# Patient Record
Sex: Female | Born: 1940
Health system: Southern US, Community
[De-identification: ages and names within clinical notes are randomized; demographics above are authoritative.]

## PROBLEM LIST (undated history)

## (undated) DIAGNOSIS — F32A Depression, unspecified: Secondary | ICD-10-CM

## (undated) DIAGNOSIS — F329 Major depressive disorder, single episode, unspecified: Secondary | ICD-10-CM

## (undated) DIAGNOSIS — Z86010 Personal history of colonic polyps: Secondary | ICD-10-CM

## (undated) DIAGNOSIS — K219 Gastro-esophageal reflux disease without esophagitis: Secondary | ICD-10-CM

## (undated) DIAGNOSIS — H409 Unspecified glaucoma: Secondary | ICD-10-CM

## (undated) DIAGNOSIS — E669 Obesity, unspecified: Secondary | ICD-10-CM

## (undated) DIAGNOSIS — I10 Essential (primary) hypertension: Secondary | ICD-10-CM

## (undated) DIAGNOSIS — Z860101 Personal history of adenomatous and serrated colon polyps: Secondary | ICD-10-CM

## (undated) DIAGNOSIS — M1990 Unspecified osteoarthritis, unspecified site: Secondary | ICD-10-CM

## (undated) DIAGNOSIS — E119 Type 2 diabetes mellitus without complications: Secondary | ICD-10-CM

## (undated) DIAGNOSIS — F0390 Unspecified dementia without behavioral disturbance: Secondary | ICD-10-CM

## (undated) DIAGNOSIS — E785 Hyperlipidemia, unspecified: Secondary | ICD-10-CM

## (undated) HISTORY — PX: ABDOMINAL HYSTERECTOMY: SHX81

## (undated) HISTORY — PX: TONSILLECTOMY AND ADENOIDECTOMY: SHX28

## (undated) HISTORY — DX: Obesity, unspecified: E66.9

## (undated) HISTORY — PX: THYROID CYST EXCISION: SHX2511

## (undated) HISTORY — DX: Essential (primary) hypertension: I10

## (undated) HISTORY — DX: Major depressive disorder, single episode, unspecified: F32.9

## (undated) HISTORY — DX: Hyperlipidemia, unspecified: E78.5

## (undated) HISTORY — PX: CHOLECYSTECTOMY: SHX55

## (undated) HISTORY — DX: Type 2 diabetes mellitus without complications: E11.9

## (undated) HISTORY — DX: Depression, unspecified: F32.A

## (undated) HISTORY — DX: Personal history of colonic polyps: Z86.010

## (undated) HISTORY — DX: Unspecified dementia, unspecified severity, without behavioral disturbance, psychotic disturbance, mood disturbance, and anxiety: F03.90

## (undated) HISTORY — DX: Unspecified osteoarthritis, unspecified site: M19.90

## (undated) HISTORY — DX: Personal history of adenomatous and serrated colon polyps: Z86.0101

## (undated) HISTORY — DX: Gastro-esophageal reflux disease without esophagitis: K21.9

## (undated) HISTORY — DX: Unspecified glaucoma: H40.9

---

## 2014-07-02 ENCOUNTER — Telehealth: Payer: Self-pay | Admitting: Family Medicine

## 2014-07-03 NOTE — Telephone Encounter (Signed)
Scheduled appt for pt Reviewed meds with pt No current controlled substances

## 2014-07-18 ENCOUNTER — Encounter (INDEPENDENT_AMBULATORY_CARE_PROVIDER_SITE_OTHER): Payer: Self-pay

## 2014-07-18 ENCOUNTER — Ambulatory Visit (INDEPENDENT_AMBULATORY_CARE_PROVIDER_SITE_OTHER): Payer: Commercial Managed Care - HMO | Admitting: Family Medicine

## 2014-07-18 ENCOUNTER — Encounter: Payer: Self-pay | Admitting: Family Medicine

## 2014-07-18 VITALS — BP 140/80 | HR 64 | Temp 97.1°F | Ht 64.0 in | Wt 190.0 lb

## 2014-07-18 DIAGNOSIS — E1169 Type 2 diabetes mellitus with other specified complication: Secondary | ICD-10-CM | POA: Insufficient documentation

## 2014-07-18 DIAGNOSIS — E119 Type 2 diabetes mellitus without complications: Secondary | ICD-10-CM | POA: Diagnosis not present

## 2014-07-18 DIAGNOSIS — F0390 Unspecified dementia without behavioral disturbance: Secondary | ICD-10-CM | POA: Insufficient documentation

## 2014-07-18 LAB — POCT GLYCOSYLATED HEMOGLOBIN (HGB A1C): Hemoglobin A1C: 6.5

## 2014-07-18 NOTE — Progress Notes (Signed)
   Subjective:    Patient ID: Linda Fletcher, female    DOB: 11-Oct-1940, 74 y.o.   MRN: [id number]  HPI 74 year old female who recently relocated from [place]. She has a history of diabetes and dementia. She was started on Aricept sometime in the past. She will be living with her son who also relocated here. She had been in what sounds like assisted living in [place]. She denies symptoms today.    Review of Systems  Constitutional: Negative.   HENT: Negative.   Respiratory: Negative.   Cardiovascular: Positive for leg swelling.  Gastrointestinal: Positive for diarrhea.  Musculoskeletal: Negative.   Neurological: Negative.   Psychiatric/Behavioral: Positive for confusion.   Patient Active Problem List   Diagnosis Date Noted  . Dementia   . Diabetes mellitus without complication    Outpatient Encounter Prescriptions as of 07/18/2014  Medication Sig  . calcium carbonate (OS-CAL) 600 MG TABS tablet Take 600 mg by mouth 2 (two) times daily with a meal.  . celecoxib (CELEBREX) 200 MG capsule   . citalopram (CELEXA) 40 MG tablet Take 40 mg by mouth daily.  Marland Kitchen donepezil (ARICEPT) 10 MG tablet Take 10 mg by mouth at bedtime.  . furosemide (LASIX) 40 MG tablet Take 40 mg by mouth daily.  Marland Kitchen glipiZIDE (GLUCOTROL) 10 MG tablet Take 10 mg by mouth 2 (two) times daily.  . lansoprazole (PREVACID) 30 MG capsule TAKE 1 CAPSULE BY MOUTH DAILY 30 MINTUES BEFORE BREAKFAST  . losartan (COZAAR) 50 MG tablet Take 50 mg by mouth daily.  Marland Kitchen lovastatin (MEVACOR) 20 MG tablet Take 20 mg by mouth daily.  . Melatonin 1 MG CAPS Take by mouth.  . metFORMIN (GLUCOPHAGE) 500 MG tablet Take 500 mg by mouth 2 (two) times daily. 1 tablet in the morning and 2 in the evening  . Probiotic Product (PROBIOTIC DAILY PO) Take by mouth.  . traMADol (ULTRAM) 50 MG tablet Take 50 mg by mouth every 6 (six) hours as needed.   No facility-administered encounter medications on file as of 07/18/2014.       Objective:   Physical  Exam  Constitutional: She is oriented to person, place, and time. She appears well-developed and well-nourished.  Neck: Normal range of motion.  Cardiovascular: Normal rate, regular rhythm, normal heart sounds and intact distal pulses.   Pulmonary/Chest: Effort normal and breath sounds normal.  Abdominal: Soft. Bowel sounds are normal. There is no tenderness.  Neurological: She is alert and oriented to person, place, and time.  To assess cognitive abilities patient was asked to draw a clock and designated time which she did successfully. She also remembered 2 of 3 words at 5 minutes, but she could not remember what she ate for supper last night          Assessment & Plan:  1. Diabetes mellitus without complication And the like Sugars at home have been less than 150 generally but she is not very compliant eating candy - POCT glycosylated hemoglobin (Hb A1C) - BMP8+EGFR  2. Dementia Discussed need or use of Aricept. Since she is having some GI issues with diarrhea suggested DC Aricept and observe for effects  Wardell Honour MD

## 2014-07-18 NOTE — Addendum Note (Signed)
Addended by: Ilean China on: 07/18/2014 11:45 AM   Modules accepted: Orders

## 2014-07-19 LAB — BMP8+EGFR
BUN/Creatinine Ratio: 21 (ref 11–26)
BUN: 14 mg/dL (ref 8–27)
CHLORIDE: 101 mmol/L (ref 97–108)
CO2: 24 mmol/L (ref 18–29)
Calcium: 9.5 mg/dL (ref 8.7–10.3)
Creatinine, Ser: 0.67 mg/dL (ref 0.57–1.00)
GFR calc non Af Amer: 86 mL/min/{1.73_m2} (ref >59)
GFR, EST AFRICAN AMERICAN: 99 mL/min/{1.73_m2} (ref >59)
GLUCOSE: 177 mg/dL — AB (ref 65–99)
POTASSIUM: 4 mmol/L (ref 3.5–5.2)
Sodium: 142 mmol/L (ref 134–144)

## 2014-07-19 MED ORDER — GLUCOSE BLOOD VI STRP: ORAL_STRIP | Status: DC

## 2014-07-19 NOTE — Addendum Note (Signed)
Addended by: Ilean China on: 07/19/2014 11:52 AM   Modules accepted: Orders

## 2014-07-20 ENCOUNTER — Telehealth: Payer: Self-pay | Admitting: *Deleted

## 2014-07-20 NOTE — Telephone Encounter (Signed)
-----   Message from Wardell Honour, MD sent at 07/19/2014  7:43 AM EDT ----- Hemoglobin A1c is at goal so no changes are recommended electrolytesshow elevated random sugar but is otherwise normal including normal renal function

## 2014-08-20 ENCOUNTER — Telehealth: Payer: Self-pay | Admitting: Family Medicine

## 2014-08-20 MED ORDER — LANSOPRAZOLE 30 MG PO CPDR: DELAYED_RELEASE_CAPSULE | ORAL | Status: DC

## 2014-08-20 MED ORDER — FUROSEMIDE 40 MG PO TABS: 40.0000 mg | ORAL_TABLET | Freq: Every day | ORAL | Status: DC

## 2014-08-20 MED ORDER — CITALOPRAM HYDROBROMIDE 40 MG PO TABS: 40.0000 mg | ORAL_TABLET | Freq: Every day | ORAL | Status: DC

## 2014-08-20 MED ORDER — LOVASTATIN 20 MG PO TABS: 20.0000 mg | ORAL_TABLET | Freq: Every day | ORAL | Status: DC

## 2014-08-20 NOTE — Telephone Encounter (Signed)
done

## 2014-08-27 ENCOUNTER — Encounter: Payer: Self-pay | Admitting: *Deleted

## 2014-09-10 ENCOUNTER — Other Ambulatory Visit: Payer: Self-pay | Admitting: Family Medicine

## 2014-09-10 MED ORDER — CELECOXIB 200 MG PO CAPS: 200.0000 mg | ORAL_CAPSULE | Freq: Every day | ORAL | Status: DC

## 2014-09-10 NOTE — Telephone Encounter (Signed)
done

## 2014-09-19 ENCOUNTER — Encounter: Payer: Commercial Managed Care - HMO | Admitting: Family Medicine

## 2014-09-20 ENCOUNTER — Encounter: Payer: Commercial Managed Care - HMO | Admitting: Family Medicine

## 2014-09-25 ENCOUNTER — Encounter: Payer: Self-pay | Admitting: Family Medicine

## 2014-09-25 ENCOUNTER — Ambulatory Visit (INDEPENDENT_AMBULATORY_CARE_PROVIDER_SITE_OTHER): Payer: Commercial Managed Care - HMO | Admitting: Family Medicine

## 2014-09-25 VITALS — BP 140/76 | HR 66 | Temp 97.5°F | Ht 64.0 in | Wt 189.0 lb

## 2014-09-25 DIAGNOSIS — E119 Type 2 diabetes mellitus without complications: Secondary | ICD-10-CM | POA: Diagnosis not present

## 2014-09-25 DIAGNOSIS — E1169 Type 2 diabetes mellitus with other specified complication: Secondary | ICD-10-CM | POA: Insufficient documentation

## 2014-09-25 DIAGNOSIS — E785 Hyperlipidemia, unspecified: Secondary | ICD-10-CM

## 2014-09-25 LAB — POCT UA - MICROALBUMIN: MICROALBUMIN (UR) POC: 20 mg/L

## 2014-09-25 LAB — POCT GLYCOSYLATED HEMOGLOBIN (HGB A1C): HEMOGLOBIN A1C: 6.3

## 2014-09-25 NOTE — Patient Instructions (Signed)
You may receive a survey either by mail or email. Please take time to complete this as it helps [place] to serve you better.  Health Maintenance Adopting a healthy lifestyle and getting preventive care can go a long way to promote health and wellness. Talk with your health care provider about what schedule of regular examinations is right for you. This is a good chance for you to check in with your provider about disease prevention and staying healthy. In between checkups, there are plenty of things you can do on your own. Experts have done a lot of research about which lifestyle changes and preventive measures are most likely to keep you healthy. Ask your health care provider for more information. WEIGHT AND DIET  Eat a healthy diet  Be sure to include plenty of vegetables, fruits, low-fat dairy products, and lean protein.  Do not eat a lot of foods high in solid fats, added sugars, or salt.  Get regular exercise. This is one of the most important things you can do for your health.  Most adults should exercise for at least 150 minutes each week. The exercise should increase your heart rate and make you sweat (moderate-intensity exercise).  Most adults should also do strengthening exercises at least twice a week. This is in addition to the moderate-intensity exercise.  Maintain a healthy weight  Body mass index (BMI) is a measurement that can be used to identify possible weight problems. It estimates body fat based on height and weight. Your health care provider can help determine your BMI and help you achieve or maintain a healthy weight.  For females 75 years of age and older:   A BMI below 18.5 is considered underweight.  A BMI of 18.5 to 24.9 is normal.  A BMI of 25 to 29.9 is considered overweight.  A BMI of 30 and above is considered obese.  Watch levels of cholesterol and blood lipids  You should start having your blood tested for lipids and cholesterol at 75 years of age, then  have this test every 5 years.  You may need to have your cholesterol levels checked more often if:  Your lipid or cholesterol levels are high.  You are older than 75 years of age.  You are at high risk for heart disease.  CANCER SCREENING   Lung Cancer  Lung cancer screening is recommended for adults 14-53 years old who are at high risk for lung cancer because of a history of smoking.  A yearly low-dose CT scan of the lungs is recommended for people who:  Currently smoke.  Have quit within the past 15 years.  Have at least a 30-pack-year history of smoking. A pack year is smoking an average of one pack of cigarettes a day for 1 year.  Yearly screening should continue until it has been 15 years since you quit.  Yearly screening should stop if you develop a health problem that would prevent you from having lung cancer treatment.  Breast Cancer  Practice breast self-awareness. This means understanding how your breasts normally appear and feel.  It also means doing regular breast self-exams. Let your health care provider know about any changes, no matter how small.  If you are in your 20s or 30s, you should have a clinical breast exam (CBE) by a health care provider every 1-3 years as part of a regular health exam.  If you are 99 or older, have a CBE every year. Also consider having a breast X-ray (mammogram)  every year.  If you have a family history of breast cancer, talk to your health care provider about genetic screening.  If you are at high risk for breast cancer, talk to your health care provider about having an MRI and a mammogram every year.  Breast cancer gene (BRCA) assessment is recommended for women who have family members with BRCA-related cancers. BRCA-related cancers include:  Breast.  Ovarian.  Tubal.  Peritoneal cancers.  Results of the assessment will determine the need for genetic counseling and BRCA1 and BRCA2 testing. Cervical Cancer Routine  pelvic examinations to screen for cervical cancer are no longer recommended for nonpregnant women who are considered low risk for cancer of the pelvic organs (ovaries, uterus, and vagina) and who do not have symptoms. A pelvic examination may be necessary if you have symptoms including those associated with pelvic infections. Ask your health care provider if a screening pelvic exam is right for you.   The Pap test is the screening test for cervical cancer for women who are considered at risk.  If you had a hysterectomy for a problem that was not cancer or a condition that could lead to cancer, then you no longer need Pap tests.  If you are older than 75 years, and you have had normal Pap tests for the past 10 years, you no longer need to have Pap tests.  If you have had past treatment for cervical cancer or a condition that could lead to cancer, you need Pap tests and screening for cancer for at least 20 years after your treatment.  If you no longer get a Pap test, assess your risk factors if they change (such as having a new sexual partner). This can affect whether you should start being screened again.  Some women have medical problems that increase their chance of getting cervical cancer. If this is the case for you, your health care provider may recommend more frequent screening and Pap tests.  The human papillomavirus (HPV) test is another test that may be used for cervical cancer screening. The HPV test looks for the virus that can cause cell changes in the cervix. The cells collected during the Pap test can be tested for HPV.  The HPV test can be used to screen women 75 years of age and older. Getting tested for HPV can extend the interval between normal Pap tests from three to five years.  An HPV test also should be used to screen women of any age who have unclear Pap test results.  After 75 years of age, women should have HPV testing as often as Pap tests.  Colorectal Cancer  This  type of cancer can be detected and often prevented.  Routine colorectal cancer screening usually begins at 75 years of age and continues through 75 years of age.  Your health care provider may recommend screening at an earlier age if you have risk factors for colon cancer.  Your health care provider may also recommend using home test kits to check for hidden blood in the stool.  A small camera at the end of a tube can be used to examine your colon directly (sigmoidoscopy or colonoscopy). This is done to check for the earliest forms of colorectal cancer.  Routine screening usually begins at age 85.  Direct examination of the colon should be repeated every 5-10 years through 75 years of age. However, you may need to be screened more often if early forms of precancerous polyps or small growths are  found. Skin Cancer  Check your skin from head to toe regularly.  Tell your health care provider about any new moles or changes in moles, especially if there is a change in a mole's shape or color.  Also tell your health care provider if you have a mole that is larger than the size of a pencil eraser.  Always use sunscreen. Apply sunscreen liberally and repeatedly throughout the day.  Protect yourself by wearing long sleeves, pants, a wide-brimmed hat, and sunglasses whenever you are outside. HEART DISEASE, DIABETES, AND HIGH BLOOD PRESSURE   Have your blood pressure checked at least every 1-2 years. High blood pressure causes heart disease and increases the risk of stroke.  If you are between 32 years and 69 years old, ask your health care provider if you should take aspirin to prevent strokes.  Have regular diabetes screenings. This involves taking a blood sample to check your fasting blood sugar level.  If you are at a normal weight and have a low risk for diabetes, have this test once every three years after 75 years of age.  If you are overweight and have a high risk for diabetes,  consider being tested at a younger age or more often. PREVENTING INFECTION  Hepatitis B  If you have a higher risk for hepatitis B, you should be screened for this virus. You are considered at high risk for hepatitis B if:  You were born in a country where hepatitis B is common. Ask your health care provider which countries are considered high risk.  Your parents were born in a high-risk country, and you have not been immunized against hepatitis B (hepatitis B vaccine).  You have HIV or AIDS.  You use needles to inject street drugs.  You live with someone who has hepatitis B.  You have had sex with someone who has hepatitis B.  You get hemodialysis treatment.  You take certain medicines for conditions, including cancer, organ transplantation, and autoimmune conditions. Hepatitis C  Blood testing is recommended for:  Everyone born from [birth year] through 1966.  Anyone with known risk factors for hepatitis C. Sexually transmitted infections (STIs)  You should be screened for sexually transmitted infections (STIs) including gonorrhea and chlamydia if:  You are sexually active and are younger than 75 years of age.  You are older than 75 years of age and your health care provider tells you that you are at risk for this type of infection.  Your sexual activity has changed since you were last screened and you are at an increased risk for chlamydia or gonorrhea. Ask your health care provider if you are at risk.  If you do not have HIV, but are at risk, it may be recommended that you take a prescription medicine daily to prevent HIV infection. This is called pre-exposure prophylaxis (PrEP). You are considered at risk if:  You are sexually active and do not regularly use condoms or know the HIV status of your partner(s).  You take drugs by injection.  You are sexually active with a partner who has HIV. Talk with your health care provider about whether you are at high risk of being  infected with HIV. If you choose to begin PrEP, you should first be tested for HIV. You should then be tested every 3 months for as long as you are taking PrEP.  PREGNANCY   If you are premenopausal and you may become pregnant, ask your health care provider about preconception counseling.  If  you may become pregnant, take 400 to 800 micrograms (mcg) of folic acid every day.  If you want to prevent pregnancy, talk to your health care provider about birth control (contraception). OSTEOPOROSIS AND MENOPAUSE   Osteoporosis is a disease in which the bones lose minerals and strength with aging. This can result in serious bone fractures. Your risk for osteoporosis can be identified using a bone density scan.  If you are 100 years of age or older, or if you are at risk for osteoporosis and fractures, ask your health care provider if you should be screened.  Ask your health care provider whether you should take a calcium or vitamin D supplement to lower your risk for osteoporosis.  Menopause may have certain physical symptoms and risks.  Hormone replacement therapy may reduce some of these symptoms and risks. Talk to your health care provider about whether hormone replacement therapy is right for you.  HOME CARE INSTRUCTIONS   Schedule regular health, dental, and eye exams.  Stay current with your immunizations.   Do not use any tobacco products including cigarettes, chewing tobacco, or electronic cigarettes.  If you are pregnant, do not drink alcohol.  If you are breastfeeding, limit how much and how often you drink alcohol.  Limit alcohol intake to no more than 1 drink per day for nonpregnant women. One drink equals 12 ounces of beer, 5 ounces of wine, or 1 ounces of hard liquor.  Do not use street drugs.  Do not share needles.  Ask your health care provider for help if you need support or information about quitting drugs.  Tell your health care provider if you often feel  depressed.  Tell your health care provider if you have ever been abused or do not feel safe at home. Document Released: 07/14/2010 Document Revised: 05/15/2013 Document Reviewed: 11/30/2012 [facility] Patient Information 2015 [place], [place]. This information is not intended to replace advice given to you by your health care provider. Make sure you discuss any questions you have with your health care provider.

## 2014-09-25 NOTE — Progress Notes (Signed)
   Subjective:    Patient ID: Linda Fletcher, female    DOB: January 16, 1940, 74 y.o.   MRN: [id number]  HPI  74 year old female who is here to follow-up her diabetes and dementia. She also has hyperlipidemia and takes statin therapy. Her complaints today are arthritis and diarrhea. The diarrhea seems to have started at about the same time she was started on Aricept before she relocated here. She also takes metformin which can cause diarrhea but she has been on that for years without side effects. Regarding her dementia and memory. Per her son, it has not changed in the past 3 months. She does tend to repeat herself but otherwise does well.          Patient Active Problem List   Diagnosis Date Noted  . Dementia   . Diabetes mellitus without complication    Outpatient Encounter Prescriptions as of 09/25/2014  Medication Sig  . calcium carbonate (OS-CAL) 600 MG TABS tablet Take 600 mg by mouth 2 (two) times daily with a meal.  . celecoxib (CELEBREX) 200 MG capsule Take 1 capsule (200 mg total) by mouth daily.  . citalopram (CELEXA) 40 MG tablet Take 1 tablet (40 mg total) by mouth daily.  Marland Kitchen donepezil (ARICEPT) 10 MG tablet Take 10 mg by mouth at bedtime.  . furosemide (LASIX) 40 MG tablet Take 1 tablet (40 mg total) by mouth daily.  Marland Kitchen glipiZIDE (GLUCOTROL) 10 MG tablet Take 10 mg by mouth 2 (two) times daily.  Marland Kitchen glucose blood (FREESTYLE LITE) test strip Use 2-3 times daily. Dx: E11.9  . lansoprazole (PREVACID) 30 MG capsule TAKE 1 CAPSULE BY MOUTH DAILY 30 MINTUES BEFORE BREAKFAST  . losartan (COZAAR) 50 MG tablet Take 50 mg by mouth daily.  Marland Kitchen lovastatin (MEVACOR) 20 MG tablet Take 1 tablet (20 mg total) by mouth daily.  . Melatonin 1 MG CAPS Take by mouth.  . metFORMIN (GLUCOPHAGE) 500 MG tablet Take 500 mg by mouth 2 (two) times daily. 1 tablet in the morning and 2 in the evening  . Probiotic Product (PROBIOTIC DAILY PO) Take by mouth.  . traMADol (ULTRAM) 50 MG tablet Take 50 mg by mouth  every 6 (six) hours as needed.   No facility-administered encounter medications on file as of 09/25/2014.      Review of Systems  Constitutional: Negative.   HENT: Negative.   Eyes: Negative.   Respiratory: Negative.   Cardiovascular: Negative.   Musculoskeletal: Positive for arthralgias.  Neurological: Negative.   Psychiatric/Behavioral: Positive for confusion.       Objective:   Physical Exam  Psychiatric: She has a normal mood and affect. Her behavior is normal.    BP 140/76 mmHg  Pulse 66  Temp(Src) 97.5 F (36.4 C) (Oral)  Ht $R'5\' 4"'sY$  (1.626 m)  Wt 189 lb (85.73 kg)  BMI 32.43 kg/m2'      Assessment & Plan:  1. Diabetes mellitus without complication Previous J1O was 6.5. She does check her sugar some at home and they have generally been okay anticipate no change today - POCT UA - Microalbumin - CMP14+EGFR  2. Hyperlipidemia We have not checked a slide lipids since she moved here. I think would be appropriate to see where we are with statins and usage today  Wardell Honour MD - Lipid panel

## 2014-09-25 NOTE — Addendum Note (Signed)
Addended by: Earlene Plater on: 09/25/2014 09:57 AM   Modules accepted: Orders, SmartSet

## 2014-09-26 ENCOUNTER — Telehealth: Payer: Self-pay | Admitting: Family Medicine

## 2014-09-26 LAB — LIPID PANEL
CHOLESTEROL TOTAL: 153 mg/dL (ref 100–199)
Chol/HDL Ratio: 3.6 ratio units (ref 0.0–4.4)
HDL: 42 mg/dL (ref >39)
LDL CALC: 73 mg/dL (ref 0–99)
TRIGLYCERIDES: 191 mg/dL — AB (ref 0–149)
VLDL CHOLESTEROL CAL: 38 mg/dL (ref 5–40)

## 2014-09-26 LAB — CMP14+EGFR
A/G RATIO: 1.7 (ref 1.1–2.5)
ALK PHOS: 85 IU/L (ref 39–117)
ALT: 19 IU/L (ref 0–32)
AST: 20 IU/L (ref 0–40)
Albumin: 4.1 g/dL (ref 3.5–4.8)
BUN/Creatinine Ratio: 16 (ref 11–26)
BUN: 12 mg/dL (ref 8–27)
Bilirubin Total: 0.3 mg/dL (ref 0.0–1.2)
CO2: 26 mmol/L (ref 18–29)
Calcium: 9.1 mg/dL (ref 8.7–10.3)
Chloride: 99 mmol/L (ref 97–108)
Creatinine, Ser: 0.73 mg/dL (ref 0.57–1.00)
GFR calc Af Amer: 93 mL/min/{1.73_m2} (ref >59)
GFR calc non Af Amer: 81 mL/min/{1.73_m2} (ref >59)
GLOBULIN, TOTAL: 2.4 g/dL (ref 1.5–4.5)
Glucose: 148 mg/dL — ABNORMAL HIGH (ref 65–99)
POTASSIUM: 4.3 mmol/L (ref 3.5–5.2)
SODIUM: 140 mmol/L (ref 134–144)
Total Protein: 6.5 g/dL (ref 6.0–8.5)

## 2014-09-26 LAB — MICROALBUMIN, URINE: MICROALBUM., U, RANDOM: 34.3 ug/mL

## 2014-09-26 MED ORDER — METFORMIN HCL 500 MG PO TABS: ORAL_TABLET | ORAL | Status: DC

## 2014-09-26 MED ORDER — GLIPIZIDE 10 MG PO TABS: 10.0000 mg | ORAL_TABLET | Freq: Two times a day (BID) | ORAL | Status: DC

## 2014-09-26 NOTE — Telephone Encounter (Signed)
This pt was just here yesterday.

## 2014-09-26 NOTE — Telephone Encounter (Signed)
Verified that patient is taking metformin 1 in the morning and 2 in the evening. Script for metformin and glipizide sent to mail order as requested.  Dr Sabra Heck took patient off of Celebrex at visit yesterday and switched to Tramadol 50mg  TID PRN. Verbal order given but script was not ordered and printed.   Called to [facility] since Dr Sabra Heck is not here to sign hard script today.

## 2014-09-26 NOTE — Telephone Encounter (Signed)
Called son to verify directions for Metformin. I believe she is taking 1 tablet in the morning and 2 in the evening but there are two sets of directions on the prescription. Also wanted to see what she is taking Tramadol for. We haven't prescribed this that I can tell.

## 2014-09-27 NOTE — Progress Notes (Signed)
PATIENT AWARE

## 2014-10-04 ENCOUNTER — Other Ambulatory Visit: Payer: Self-pay | Admitting: Family Medicine

## 2014-10-04 ENCOUNTER — Other Ambulatory Visit: Payer: Self-pay | Admitting: *Deleted

## 2014-10-04 DIAGNOSIS — E119 Type 2 diabetes mellitus without complications: Secondary | ICD-10-CM

## 2014-10-04 MED ORDER — GLUCOSE BLOOD VI STRP: ORAL_STRIP | Status: DC

## 2014-10-04 NOTE — Telephone Encounter (Signed)
Pt did not have any particular blood glucose meter in mind. Ordered Accucheck & strips to [facility]

## 2014-10-10 ENCOUNTER — Encounter: Payer: Self-pay | Admitting: Family Medicine

## 2014-10-10 ENCOUNTER — Ambulatory Visit (INDEPENDENT_AMBULATORY_CARE_PROVIDER_SITE_OTHER): Payer: Commercial Managed Care - HMO | Admitting: Family Medicine

## 2014-10-10 VITALS — BP 114/79 | HR 72 | Temp 98.1°F | Ht 64.0 in | Wt 181.0 lb

## 2014-10-10 DIAGNOSIS — Z135 Encounter for screening for eye and ear disorders: Secondary | ICD-10-CM

## 2014-10-10 DIAGNOSIS — H409 Unspecified glaucoma: Secondary | ICD-10-CM

## 2014-10-10 DIAGNOSIS — F0390 Unspecified dementia without behavioral disturbance: Secondary | ICD-10-CM

## 2014-10-10 NOTE — Progress Notes (Signed)
Subjective:    Patient ID: Linda Fletcher, female    DOB: 1940/12/03, 74 y.o.   MRN: [id number]  HPI Pt here today for follow up on medications. She is accompanied today by her son. We had tried to discontinue Aricept but son thinks he saw a difference in her confusion. I explained that I could be lots of factors such as change in routine pain constipation urinary tract infection etc. but he took it upon himself to restart the Aricept. I have no great problem with that but still think it's not very efficacious. We spent some time talking about progression of dementia symptoms of withdrawal not eating lack of self-care etc. she is starting to show some signs of withdrawal morning to stay in bed a lot of the day. She has lost 8 pounds she was here about 3 weeks ago.       Patient Active Problem List   Diagnosis Date Noted  . Hyperlipidemia 09/25/2014  . Dementia   . Diabetes mellitus without complication    Outpatient Encounter Prescriptions as of 10/10/2014  Medication Sig  . calcium carbonate (OS-CAL) 600 MG TABS tablet Take 600 mg by mouth 2 (two) times daily with a meal.  . citalopram (CELEXA) 40 MG tablet Take 1 tablet (40 mg total) by mouth daily.  Marland Kitchen donepezil (ARICEPT) 10 MG tablet Take 10 mg by mouth at bedtime.  . furosemide (LASIX) 40 MG tablet Take 1 tablet (40 mg total) by mouth daily.  Marland Kitchen glipiZIDE (GLUCOTROL) 10 MG tablet Take 1 tablet (10 mg total) by mouth 2 (two) times daily.  Marland Kitchen glucose blood (FREESTYLE LITE) test strip Use 2-3 times daily. Dx: E11.9  . glucose blood test strip Use 2-3 times daily. E11.9  . lansoprazole (PREVACID) 30 MG capsule TAKE 1 CAPSULE BY MOUTH DAILY 30 MINTUES BEFORE BREAKFAST  . losartan (COZAAR) 50 MG tablet Take 50 mg by mouth daily.  Marland Kitchen lovastatin (MEVACOR) 20 MG tablet Take 1 tablet (20 mg total) by mouth daily.  . Melatonin 1 MG CAPS Take by mouth.  . metFORMIN (GLUCOPHAGE) 500 MG tablet 1 tablet in the morning and 2 in the evening  .  Probiotic Product (PROBIOTIC DAILY PO) Take by mouth.  . traMADol (ULTRAM) 50 MG tablet Take 50 mg by mouth every 6 (six) hours as needed.   No facility-administered encounter medications on file as of 10/10/2014.     Review of Systems  Constitutional: Negative.        Sleeping and laying around alot  HENT: Negative.   Eyes: Negative.   Respiratory: Negative.   Cardiovascular: Negative.   Gastrointestinal: Negative.   Endocrine: Negative.   Genitourinary: Negative.   Musculoskeletal: Negative.   Skin: Negative.   Allergic/Immunologic: Negative.   Neurological: Negative.   Hematological: Negative.   Psychiatric/Behavioral: Positive for confusion.       "moody"       Objective:   Physical Exam  Constitutional: She is oriented to person, place, and time. She appears well-developed and well-nourished.  Cardiovascular: Normal rate and regular rhythm.   Pulmonary/Chest: Effort normal and breath sounds normal.  Neurological: She is alert and oriented to person, place, and time.  Psychiatric: She has a normal mood and affect. Her behavior is normal.      BP 114/79 mmHg  Pulse 72  Temp(Src) 98.1 F (36.7 C) (Oral)  Ht 5\' 4"  (1.626 m)  Wt 181 lb (82.101 kg)  BMI 31.05 kg/m2      Assessment &  Plan:  1. Glaucoma Referral has noted below - Ambulatory referral to Ophthalmology    3. Dementia, without behavioral disturbance Is now back on Aricept. I think son has a little better understanding of natural history and progression of dementia is. I think we agreed in principal that less is better in terms of procedures and care that can be done when folks have dementia and that we do not alter prolong her life if quality is lacking  Wardell Honour MD

## 2014-10-16 ENCOUNTER — Telehealth: Payer: Self-pay | Admitting: Family Medicine

## 2014-10-19 MED ORDER — ACCU-CHEK AVIVA PLUS W/DEVICE KIT: PACK | Status: DC

## 2014-10-19 MED ORDER — ACCU-CHEK SOFT TOUCH LANCETS MISC: Status: DC

## 2014-10-19 MED ORDER — GLUCOSE BLOOD VI STRP: ORAL_STRIP | Status: DC

## 2014-10-19 NOTE — Telephone Encounter (Signed)
All rx's sent to [facility], pt aware

## 2014-10-25 DIAGNOSIS — H5203 Hypermetropia, bilateral: Secondary | ICD-10-CM | POA: Diagnosis not present

## 2014-10-25 DIAGNOSIS — E109 Type 1 diabetes mellitus without complications: Secondary | ICD-10-CM | POA: Diagnosis not present

## 2014-10-25 DIAGNOSIS — H25813 Combined forms of age-related cataract, bilateral: Secondary | ICD-10-CM | POA: Diagnosis not present

## 2014-10-25 DIAGNOSIS — E113293 Type 2 diabetes mellitus with mild nonproliferative diabetic retinopathy without macular edema, bilateral: Secondary | ICD-10-CM | POA: Diagnosis not present

## 2014-10-25 DIAGNOSIS — Z0100 Encounter for examination of eyes and vision without abnormal findings: Secondary | ICD-10-CM | POA: Diagnosis not present

## 2014-10-25 DIAGNOSIS — H40009 Preglaucoma, unspecified, unspecified eye: Secondary | ICD-10-CM | POA: Diagnosis not present

## 2014-10-25 DIAGNOSIS — H2510 Age-related nuclear cataract, unspecified eye: Secondary | ICD-10-CM | POA: Diagnosis not present

## 2014-10-25 DIAGNOSIS — H40013 Open angle with borderline findings, low risk, bilateral: Secondary | ICD-10-CM | POA: Diagnosis not present

## 2014-10-25 LAB — HM DIABETES EYE EXAM

## 2014-10-29 ENCOUNTER — Other Ambulatory Visit: Payer: Self-pay | Admitting: Family Medicine

## 2014-10-30 NOTE — Telephone Encounter (Signed)
Pt aware written Rx is at front desk ready for pickup  

## 2014-10-30 NOTE — Telephone Encounter (Signed)
Last filled 09/26/14, last seen 10/10/14. Rx will print

## 2014-10-31 ENCOUNTER — Other Ambulatory Visit: Payer: Self-pay | Admitting: *Deleted

## 2014-10-31 MED ORDER — LOSARTAN POTASSIUM 50 MG PO TABS: 50.0000 mg | ORAL_TABLET | Freq: Every day | ORAL | Status: DC

## 2014-11-15 DIAGNOSIS — H269 Unspecified cataract: Secondary | ICD-10-CM | POA: Diagnosis not present

## 2014-11-15 DIAGNOSIS — F329 Major depressive disorder, single episode, unspecified: Secondary | ICD-10-CM | POA: Diagnosis not present

## 2014-11-15 DIAGNOSIS — H409 Unspecified glaucoma: Secondary | ICD-10-CM | POA: Diagnosis not present

## 2014-11-15 DIAGNOSIS — E785 Hyperlipidemia, unspecified: Secondary | ICD-10-CM | POA: Diagnosis not present

## 2014-11-15 DIAGNOSIS — M130 Polyarthritis, unspecified: Secondary | ICD-10-CM | POA: Diagnosis not present

## 2014-11-15 DIAGNOSIS — I10 Essential (primary) hypertension: Secondary | ICD-10-CM | POA: Diagnosis not present

## 2014-11-15 DIAGNOSIS — E119 Type 2 diabetes mellitus without complications: Secondary | ICD-10-CM | POA: Diagnosis not present

## 2014-11-15 DIAGNOSIS — K219 Gastro-esophageal reflux disease without esophagitis: Secondary | ICD-10-CM | POA: Diagnosis not present

## 2014-11-15 DIAGNOSIS — F0390 Unspecified dementia without behavioral disturbance: Secondary | ICD-10-CM | POA: Diagnosis not present

## 2014-11-21 ENCOUNTER — Other Ambulatory Visit: Payer: Self-pay | Admitting: *Deleted

## 2014-11-21 MED ORDER — DONEPEZIL HCL 10 MG PO TABS: 10.0000 mg | ORAL_TABLET | Freq: Every day | ORAL | Status: DC

## 2014-11-23 ENCOUNTER — Other Ambulatory Visit: Payer: Self-pay | Admitting: *Deleted

## 2014-11-23 MED ORDER — DONEPEZIL HCL 10 MG PO TABS: 10.0000 mg | ORAL_TABLET | Freq: Every day | ORAL | Status: DC

## 2014-11-30 ENCOUNTER — Other Ambulatory Visit: Payer: Self-pay | Admitting: Family Medicine

## 2014-12-04 ENCOUNTER — Telehealth: Payer: Self-pay | Admitting: Family Medicine

## 2014-12-07 ENCOUNTER — Other Ambulatory Visit: Payer: Self-pay | Admitting: *Deleted

## 2014-12-07 MED ORDER — TRAMADOL HCL 50 MG PO TABS: 50.0000 mg | ORAL_TABLET | Freq: Three times a day (TID) | ORAL | Status: DC | PRN

## 2015-01-01 ENCOUNTER — Other Ambulatory Visit: Payer: Self-pay | Admitting: Family Medicine

## 2015-01-02 DIAGNOSIS — G309 Alzheimer's disease, unspecified: Secondary | ICD-10-CM | POA: Diagnosis not present

## 2015-01-02 DIAGNOSIS — Z0000 Encounter for general adult medical examination without abnormal findings: Secondary | ICD-10-CM | POA: Diagnosis not present

## 2015-01-02 DIAGNOSIS — R9431 Abnormal electrocardiogram [ECG] [EKG]: Secondary | ICD-10-CM | POA: Diagnosis not present

## 2015-01-02 DIAGNOSIS — E1169 Type 2 diabetes mellitus with other specified complication: Secondary | ICD-10-CM | POA: Diagnosis not present

## 2015-01-02 DIAGNOSIS — F3341 Major depressive disorder, recurrent, in partial remission: Secondary | ICD-10-CM | POA: Diagnosis not present

## 2015-01-02 DIAGNOSIS — K219 Gastro-esophageal reflux disease without esophagitis: Secondary | ICD-10-CM | POA: Diagnosis not present

## 2015-01-02 DIAGNOSIS — M1991 Primary osteoarthritis, unspecified site: Secondary | ICD-10-CM | POA: Diagnosis not present

## 2015-01-02 DIAGNOSIS — E782 Mixed hyperlipidemia: Secondary | ICD-10-CM | POA: Diagnosis not present

## 2015-01-09 ENCOUNTER — Ambulatory Visit (INDEPENDENT_AMBULATORY_CARE_PROVIDER_SITE_OTHER): Payer: Commercial Managed Care - HMO | Admitting: Family Medicine

## 2015-01-09 ENCOUNTER — Encounter: Payer: Self-pay | Admitting: Family Medicine

## 2015-01-09 VITALS — BP 133/74 | HR 74 | Temp 96.8°F | Ht 64.0 in | Wt 180.0 lb

## 2015-01-09 DIAGNOSIS — F0390 Unspecified dementia without behavioral disturbance: Secondary | ICD-10-CM

## 2015-01-09 DIAGNOSIS — Z23 Encounter for immunization: Secondary | ICD-10-CM | POA: Diagnosis not present

## 2015-01-09 DIAGNOSIS — E119 Type 2 diabetes mellitus without complications: Secondary | ICD-10-CM | POA: Diagnosis not present

## 2015-01-09 LAB — POCT GLYCOSYLATED HEMOGLOBIN (HGB A1C): HEMOGLOBIN A1C: 6

## 2015-01-09 MED ORDER — TRAMADOL HCL 50 MG PO TABS: 50.0000 mg | ORAL_TABLET | Freq: Three times a day (TID) | ORAL | Status: DC | PRN

## 2015-01-09 NOTE — Progress Notes (Signed)
   Subjective:    Patient ID: Linda Fletcher, female    DOB: Jan 19, 1940, 74 y.o.   MRN: [id number]  HPI 74 year old female who is here to follow-up her diabetes and hypertension and dementia. She is accompanied again by her son who says there have not been any changes to past 3 months regarding her dementia. She still has general lack of interest in getting out of bed and spends once or time in bed watching TV. Her weight is the same as 3 months ago. There have been no falls and no swallowing difficulties.  Patient Active Problem List   Diagnosis Date Noted  . Hyperlipidemia 09/25/2014  . Dementia   . Diabetes mellitus without complication [facility])    Outpatient Encounter Prescriptions as of 01/09/2015  Medication Sig  . Blood Glucose Monitoring Suppl (ACCU-CHEK AVIVA PLUS) W/DEVICE KIT Use to check sugar bid. DX E11.9  . calcium carbonate (OS-CAL) 600 MG TABS tablet Take 600 mg by mouth 2 (two) times daily with a meal.  . citalopram (CELEXA) 40 MG tablet TAKE 1 TABLET (40 MG TOTAL) BY MOUTH DAILY.  Marland Kitchen donepezil (ARICEPT) 10 MG tablet Take 1 tablet (10 mg total) by mouth at bedtime.  . furosemide (LASIX) 40 MG tablet Take 1 tablet (40 mg total) by mouth daily.  Marland Kitchen glipiZIDE (GLUCOTROL) 10 MG tablet Take 1 tablet (10 mg total) by mouth 2 (two) times daily.  Marland Kitchen glucose blood (ACCU-CHEK AVIVA PLUS) test strip Check blood sugar bid. DX E11.9  . Lancets (ACCU-CHEK SOFT TOUCH) lancets Test sugar bid. DX E11.9  . lansoprazole (PREVACID) 30 MG capsule TAKE 1 CAPSULE BY MOUTH DAILY 30 MINTUES BEFORE BREAKFAST  . losartan (COZAAR) 50 MG tablet Take 1 tablet (50 mg total) by mouth daily.  Marland Kitchen lovastatin (MEVACOR) 20 MG tablet TAKE 1 TABLET EVERY DAY  . Melatonin 1 MG CAPS Take by mouth.  . metFORMIN (GLUCOPHAGE) 500 MG tablet 1 tablet in the morning and 2 in the evening  . Probiotic Product (PROBIOTIC DAILY PO) Take by mouth.  . traMADol (ULTRAM) 50 MG tablet Take 1 tablet (50 mg total) by mouth 3 (three)  times daily as needed.  . [DISCONTINUED] traMADol (ULTRAM) 50 MG tablet Take 1 tablet (50 mg total) by mouth 3 (three) times daily as needed.   No facility-administered encounter medications on file as of 01/09/2015.      Review of Systems  Constitutional: Positive for activity change.  HENT: Negative.   Respiratory: Negative.   Cardiovascular: Negative.   Neurological: Negative.   Psychiatric/Behavioral: Negative.        Objective:   Physical Exam  Constitutional: She appears well-developed and well-nourished.  HENT:  Head: Normocephalic.  Cardiovascular: Normal rate, regular rhythm and normal heart sounds.   Pulmonary/Chest: Effort normal and breath sounds normal.  Neurological: She is alert. She displays normal reflexes. Coordination normal.  Psychiatric: She has a normal mood and affect. Her behavior is normal.          Assessment & Plan:  1. Diabetes mellitus without complication (HCC) D4Y today is 6.0. Current treatment regimen is metformin twice a day. Diet is satisfactory - POCT glycosylated hemoglobin (Hb A1C)  2. Dementia, without behavioral disturbance No significant change from last visit but the general apathy and lack of interest is surely an early sign of dementia  Wardell Honour MD

## 2015-02-05 ENCOUNTER — Other Ambulatory Visit: Payer: Self-pay | Admitting: Family Medicine

## 2015-02-15 ENCOUNTER — Ambulatory Visit (INDEPENDENT_AMBULATORY_CARE_PROVIDER_SITE_OTHER): Payer: Commercial Managed Care - HMO | Admitting: Family Medicine

## 2015-02-15 ENCOUNTER — Encounter: Payer: Self-pay | Admitting: Family Medicine

## 2015-02-15 VITALS — BP 133/84 | HR 91 | Temp 97.0°F | Ht 64.0 in | Wt 175.8 lb

## 2015-02-15 DIAGNOSIS — F0390 Unspecified dementia without behavioral disturbance: Secondary | ICD-10-CM | POA: Diagnosis not present

## 2015-02-15 MED ORDER — TRAMADOL HCL 50 MG PO TABS: 50.0000 mg | ORAL_TABLET | Freq: Three times a day (TID) | ORAL | Status: DC | PRN

## 2015-02-15 NOTE — Progress Notes (Signed)
   Subjective:    Patient ID: Linda Fletcher, female    DOB: Aug 19, 1940, 75 y.o.   MRN: [id number]  HPI 75 year old female with dementia. This was an unscheduled visit. She is showing more apathy wanting to stay in bed most of the day. She has had more confusion thinking that she was somebody that she is not. Appetite is still good. There have been no falls. There may be some early dysphagia. It seems that her arthritis pain is worse. Currently she uses tramadol. We discussed use of hydrocodone and possible side effect of constipation and she is adamantly against taking it. She are he has diverticulitis and that would probably aggravate that same problem.  Patient Active Problem List   Diagnosis Date Noted  . Hyperlipidemia 09/25/2014  . Dementia   . Diabetes mellitus without complication [facility])    Outpatient Encounter Prescriptions as of 02/15/2015  Medication Sig  . Blood Glucose Monitoring Suppl (ACCU-CHEK AVIVA PLUS) W/DEVICE KIT Use to check sugar bid. DX E11.9  . calcium carbonate (OS-CAL) 600 MG TABS tablet Take 600 mg by mouth 2 (two) times daily with a meal.  . citalopram (CELEXA) 40 MG tablet TAKE 1 TABLET (40 MG TOTAL) BY MOUTH DAILY.  Marland Kitchen donepezil (ARICEPT) 10 MG tablet Take 1 tablet (10 mg total) by mouth at bedtime.  . furosemide (LASIX) 40 MG tablet Take 1 tablet (40 mg total) by mouth daily.  Marland Kitchen glipiZIDE (GLUCOTROL) 10 MG tablet TAKE 1 TABLET TWICE DAILY  . glucose blood (ACCU-CHEK AVIVA PLUS) test strip Check blood sugar bid. DX E11.9  . Lancets (ACCU-CHEK SOFT TOUCH) lancets Test sugar bid. DX E11.9  . lansoprazole (PREVACID) 30 MG capsule TAKE 1 CAPSULE BY MOUTH DAILY 30 MINTUES BEFORE BREAKFAST  . losartan (COZAAR) 50 MG tablet Take 1 tablet (50 mg total) by mouth daily.  Marland Kitchen lovastatin (MEVACOR) 20 MG tablet TAKE 1 TABLET EVERY DAY  . Melatonin 1 MG CAPS Take by mouth.  . metFORMIN (GLUCOPHAGE) 500 MG tablet TAKE 1 TABLET IN THE MORNING AND TAKE 2 TABLETS IN THE EVENING  .  Probiotic Product (PROBIOTIC DAILY PO) Take by mouth.  . traMADol (ULTRAM) 50 MG tablet Take 1 tablet (50 mg total) by mouth 3 (three) times daily as needed.   No facility-administered encounter medications on file as of 02/15/2015.      Review of Systems  Constitutional: Positive for activity change.  HENT: Positive for ear discharge.   Respiratory: Negative.   Cardiovascular: Negative.   Genitourinary: Negative.   Neurological: Negative.   Psychiatric/Behavioral: Positive for hallucinations and confusion.       Objective:   Physical Exam  Constitutional: She is oriented to person, place, and time. She appears well-developed and well-nourished.  Cardiovascular: Normal rate and regular rhythm.   Pulmonary/Chest: Effort normal and breath sounds normal.  Neurological: She is alert and oriented to person, place, and time.  Psychiatric: She has a normal mood and affect. Her behavior is normal.          Assessment & Plan:  1. Dementia, without behavioral disturbance Continue  use with Aricept and the son takes he can tell a difference when she takes it or doesn't take it. I am surprised that this. We'll continue with tramadol for arthritis pain in the morning and 2 at night. Encourage activity and getting out of bed but according to son that is problematic  Wardell Honour MD

## 2015-02-19 ENCOUNTER — Telehealth: Payer: Self-pay | Admitting: Family Medicine

## 2015-02-20 NOTE — Telephone Encounter (Signed)
Please address

## 2015-02-27 ENCOUNTER — Other Ambulatory Visit: Payer: Self-pay | Admitting: Family Medicine

## 2015-03-01 DIAGNOSIS — H40013 Open angle with borderline findings, low risk, bilateral: Secondary | ICD-10-CM | POA: Diagnosis not present

## 2015-03-01 DIAGNOSIS — H40033 Anatomical narrow angle, bilateral: Secondary | ICD-10-CM | POA: Diagnosis not present

## 2015-03-04 ENCOUNTER — Other Ambulatory Visit: Payer: Self-pay | Admitting: *Deleted

## 2015-03-04 DIAGNOSIS — Z1211 Encounter for screening for malignant neoplasm of colon: Secondary | ICD-10-CM

## 2015-03-05 ENCOUNTER — Encounter (INDEPENDENT_AMBULATORY_CARE_PROVIDER_SITE_OTHER): Payer: Self-pay | Admitting: *Deleted

## 2015-03-08 ENCOUNTER — Other Ambulatory Visit: Payer: Self-pay

## 2015-03-08 DIAGNOSIS — K219 Gastro-esophageal reflux disease without esophagitis: Secondary | ICD-10-CM

## 2015-03-13 ENCOUNTER — Encounter (INDEPENDENT_AMBULATORY_CARE_PROVIDER_SITE_OTHER): Payer: Self-pay | Admitting: Internal Medicine

## 2015-03-18 ENCOUNTER — Other Ambulatory Visit: Payer: Self-pay | Admitting: Family Medicine

## 2015-03-18 NOTE — Telephone Encounter (Signed)
Last seen and filled 02/15/15. Rx will print

## 2015-03-19 DIAGNOSIS — B351 Tinea unguium: Secondary | ICD-10-CM | POA: Diagnosis not present

## 2015-03-19 DIAGNOSIS — L84 Corns and callosities: Secondary | ICD-10-CM | POA: Diagnosis not present

## 2015-03-19 DIAGNOSIS — E119 Type 2 diabetes mellitus without complications: Secondary | ICD-10-CM | POA: Diagnosis not present

## 2015-03-19 MED ORDER — TRAMADOL HCL 50 MG PO TABS: 50.0000 mg | ORAL_TABLET | Freq: Three times a day (TID) | ORAL | Status: DC | PRN

## 2015-03-19 NOTE — Telephone Encounter (Signed)
Pt aware written Rx is at front desk ready for pickup  

## 2015-04-10 ENCOUNTER — Ambulatory Visit (INDEPENDENT_AMBULATORY_CARE_PROVIDER_SITE_OTHER): Payer: Commercial Managed Care - HMO | Admitting: Internal Medicine

## 2015-04-10 ENCOUNTER — Encounter (INDEPENDENT_AMBULATORY_CARE_PROVIDER_SITE_OTHER): Payer: Self-pay | Admitting: Internal Medicine

## 2015-04-10 VITALS — BP 104/58 | HR 60 | Temp 98.5°F | Ht 64.0 in | Wt 172.9 lb

## 2015-04-10 DIAGNOSIS — K219 Gastro-esophageal reflux disease without esophagitis: Secondary | ICD-10-CM | POA: Diagnosis not present

## 2015-04-10 MED ORDER — OMEPRAZOLE 40 MG PO CPDR: 40.0000 mg | DELAYED_RELEASE_CAPSULE | Freq: Every day | ORAL | Status: DC

## 2015-04-10 NOTE — Progress Notes (Signed)
Subjective:    Patient ID: Linda Fletcher, female    DOB: 01-Jan-1941, 75 y.o.   MRN: [id number]  HPI Presents today stating she wants a scope down her esophagus. She is referred by Dr. Teodoro Spray.  Her last EGD was one year ago and she tells me she had " cancer polps". She tells me she is doing okay.  She says she has acid reflux every day. Before the close of our conversation, I ask patient if her acid reflux is controlled with the Prevacid and she says yes. She states she does not have any acid reflux. (Patient has hx of dementia). Appetite is good.  She is not having any abdominal  pain and never has She usually has a BM 3-4 times a day. Stools are usually formed.    I No procedure notes in computer). He last colonoscopy it appears was February 04/2014 . (No procedure notes in computer) Biopsy: Benign colonic mucosa with surface hyperplastic features. No acute or chronic colitis seen.  02/01/2014 Biopsy: Body of stomach,proximal: Mild chronic gastritis, No h. Pylori organisms seen, intestinal metaplasia, dysplasia or malignancy seen.  10/12/2013 EGD: pre-op diagnosis: GERD, possible early Barrett's. Post Op  3 polyps removed 8-10 mm.  No hiatal hernia.  Proximal body: diffuse erythem. Distal body: diffuse erythema, Fundus: polypoid mucosa, Antrum: erosive gastritis, Pylorus: NL.  Biopsy: Polypoid gastric fundic mucosa with mild chronic gastritis. No h. Pylori, intestina metaplasia, dysplasia, or malignancy seen.  10/14/2012 Biopsy: Gastro-esophageal junction: Squamous and gastric mucosa with mild chronic inflammation, No intestinal metaplasia present.  PAS/ALCIAN blue negative for acid mucin.  Negative for H. Pylori.   Review of Systems Past Medical History  Diagnosis Date  . Hyperlipidemia   . Hypertension   . Diabetes mellitus without complication ([place])   . Dementia   . GERD (gastroesophageal reflux disease)   . Arthritis     Past Surgical History  Procedure Laterality Date  .  Thyroid cyst excision    . Cholecystectomy    . Abdominal hysterectomy    . Tonsillectomy and adenoidectomy      No Known Allergies  Current Outpatient Prescriptions on File Prior to Visit  Medication Sig Dispense Refill  . calcium carbonate (OS-CAL) 600 MG TABS tablet Take 600 mg by mouth 2 (two) times daily with a meal.    . citalopram (CELEXA) 40 MG tablet TAKE 1 TABLET EVERY DAY 90 tablet 0  . donepezil (ARICEPT) 10 MG tablet Take 1 tablet (10 mg total) by mouth at bedtime. 90 tablet 1  . furosemide (LASIX) 40 MG tablet TAKE 1 TABLET EVERY DAY 90 tablet 1  . glipiZIDE (GLUCOTROL) 10 MG tablet TAKE 1 TABLET TWICE DAILY 180 tablet 1  . lansoprazole (PREVACID) 30 MG capsule TAKE 1 CAPSULE EVERY DAY  30  MINUTES BEFORE BREAKFAST 90 capsule 1  . losartan (COZAAR) 50 MG tablet TAKE 1 TABLET EVERY DAY 90 tablet 1  . lovastatin (MEVACOR) 20 MG tablet TAKE 1 TABLET EVERY DAY 90 tablet 1  . Melatonin 1 MG CAPS Take by mouth.    . metFORMIN (GLUCOPHAGE) 500 MG tablet TAKE 1 TABLET IN THE MORNING AND TAKE 2 TABLETS IN THE EVENING 270 tablet 1  . Probiotic Product (PROBIOTIC DAILY PO) Take by mouth.    . traMADol (ULTRAM) 50 MG tablet Take 1 tablet (50 mg total) by mouth 3 (three) times daily as needed. 90 tablet 0  . Blood Glucose Monitoring Suppl (ACCU-CHEK AVIVA PLUS) W/DEVICE KIT Use to  check sugar bid. DX E11.9 (Patient not taking: Reported on 04/10/2015) 1 kit 0  . glucose blood (ACCU-CHEK AVIVA PLUS) test strip Check blood sugar bid. DX E11.9 (Patient not taking: Reported on 04/10/2015) 300 each 1  . Lancets (ACCU-CHEK SOFT TOUCH) lancets Test sugar bid. DX E11.9 (Patient not taking: Reported on 04/10/2015) 300 each 1   No current facility-administered medications on file prior to visit.        Objective:   Physical ExamBlood pressure 104/58, pulse 60, temperature 98.5 F (36.9 C), height _0  (1.626 m), weight 172 lb 14.4 oz (78.427 kg).  Alert. Patient appears to have some confusion.   Skin warm and dry. Oral mucosa is moist.   . Sclera anicteric, conjunctivae is pink. Thyroid not enlarged. No cervical lymphadenopathy. Lungs clear. Heart regular rate and rhythm.  Abdomen is soft. Bowel sounds are positive. No hepatomegaly. No abdominal masses felt. No tenderness.  No edema to lower extremities.         Assessment & Plan:  GERD. ? Controlled. Will switch to Omeperazole 100m daily. OV in 1 year.

## 2015-04-10 NOTE — Patient Instructions (Signed)
OV in 1 year.  

## 2015-04-11 ENCOUNTER — Ambulatory Visit (INDEPENDENT_AMBULATORY_CARE_PROVIDER_SITE_OTHER): Payer: Commercial Managed Care - HMO | Admitting: Family Medicine

## 2015-04-11 ENCOUNTER — Encounter: Payer: Self-pay | Admitting: Family Medicine

## 2015-04-11 VITALS — BP 96/57 | HR 71 | Temp 97.7°F | Ht 64.0 in | Wt 173.6 lb

## 2015-04-11 DIAGNOSIS — E119 Type 2 diabetes mellitus without complications: Secondary | ICD-10-CM

## 2015-04-11 DIAGNOSIS — E785 Hyperlipidemia, unspecified: Secondary | ICD-10-CM

## 2015-04-11 LAB — BAYER DCA HB A1C WAIVED: HB A1C (BAYER DCA - WAIVED): 5.6 % (ref <7.0)

## 2015-04-11 NOTE — Progress Notes (Signed)
   Subjective:    Patient ID: Linda Fletcher, female    DOB: 1940-04-28, 75 y.o.   MRN: [id number]  HPI 75 year old female here to follow-up diabetes hyperlipidemia and hypertension. She has no complaints today. She saw GI recently and did not need endoscopy but will have another endoscopy in a year according to her history. Memory seems to be holding up. She knew day date and what she ate for supper last night. Sugars have been good at home. This morning was 82 according to her history.  Patient Active Problem List   Diagnosis Date Noted  . Hyperlipidemia 09/25/2014  . Dementia   . Diabetes mellitus without complication [facility])    Outpatient Encounter Prescriptions as of 04/11/2015  Medication Sig  . Blood Glucose Monitoring Suppl (ACCU-CHEK AVIVA PLUS) W/DEVICE KIT Use to check sugar bid. DX E11.9  . calcium carbonate (OS-CAL) 600 MG TABS tablet Take 600 mg by mouth 2 (two) times daily with a meal.  . citalopram (CELEXA) 40 MG tablet TAKE 1 TABLET EVERY DAY  . donepezil (ARICEPT) 10 MG tablet Take 1 tablet (10 mg total) by mouth at bedtime.  . furosemide (LASIX) 40 MG tablet TAKE 1 TABLET EVERY DAY  . glipiZIDE (GLUCOTROL) 10 MG tablet TAKE 1 TABLET TWICE DAILY  . glucose blood (ACCU-CHEK AVIVA PLUS) test strip Check blood sugar bid. DX E11.9  . Lancets (ACCU-CHEK SOFT TOUCH) lancets Test sugar bid. DX E11.9  . lansoprazole (PREVACID) 30 MG capsule TAKE 1 CAPSULE EVERY DAY  30  MINUTES BEFORE BREAKFAST  . losartan (COZAAR) 50 MG tablet TAKE 1 TABLET EVERY DAY  . lovastatin (MEVACOR) 20 MG tablet TAKE 1 TABLET EVERY DAY  . Melatonin 1 MG CAPS Take by mouth.  . metFORMIN (GLUCOPHAGE) 500 MG tablet TAKE 1 TABLET IN THE MORNING AND TAKE 2 TABLETS IN THE EVENING  . omeprazole (PRILOSEC) 40 MG capsule Take 1 capsule (40 mg total) by mouth daily.  . Probiotic Product (PROBIOTIC DAILY PO) Take by mouth.  . traMADol (ULTRAM) 50 MG tablet Take 1 tablet (50 mg total) by mouth 3 (three) times daily  as needed.   No facility-administered encounter medications on file as of 04/11/2015.      Review of Systems  Constitutional: Negative.   Respiratory: Negative.   Cardiovascular: Negative.   Musculoskeletal: Positive for arthralgias.  Psychiatric/Behavioral: Positive for decreased concentration.       Objective:   Physical Exam  Constitutional: She is oriented to person, place, and time. She appears well-developed and well-nourished.  Cardiovascular: Normal rate and regular rhythm.   Murmur heard. Pulmonary/Chest: Effort normal and breath sounds normal.  Musculoskeletal: Normal range of motion.  Neurological: She is alert and oriented to person, place, and time.  Psychiatric: She has a normal mood and affect.          Assessment & Plan:  1. Diabetes mellitus without complication ([place]) Asked A1c was 6.0. Sugars have been well controlled on current regimen of metformin and glipizide - Bayer DCA Hb A1c Waived - CMP14+EGFR  2. Hyperlipidemia Last LDL was 73 with total cholesterol of 153 - Lipid panel  Wardell Honour MD

## 2015-04-12 LAB — CMP14+EGFR
A/G RATIO: 1.5 (ref 1.2–2.2)
ALT: 14 IU/L (ref 0–32)
AST: 19 IU/L (ref 0–40)
Albumin: 4.1 g/dL (ref 3.5–4.8)
Alkaline Phosphatase: 80 IU/L (ref 39–117)
BUN/Creatinine Ratio: 18 (ref 11–26)
BUN: 14 mg/dL (ref 8–27)
Bilirubin Total: 0.3 mg/dL (ref 0.0–1.2)
CALCIUM: 9.8 mg/dL (ref 8.7–10.3)
CO2: 26 mmol/L (ref 18–29)
CREATININE: 0.78 mg/dL (ref 0.57–1.00)
Chloride: 97 mmol/L (ref 96–106)
GFR, EST AFRICAN AMERICAN: 86 mL/min/{1.73_m2} (ref >59)
GFR, EST NON AFRICAN AMERICAN: 75 mL/min/{1.73_m2} (ref >59)
GLOBULIN, TOTAL: 2.7 g/dL (ref 1.5–4.5)
Glucose: 138 mg/dL — ABNORMAL HIGH (ref 65–99)
POTASSIUM: 4.2 mmol/L (ref 3.5–5.2)
Sodium: 141 mmol/L (ref 134–144)
TOTAL PROTEIN: 6.8 g/dL (ref 6.0–8.5)

## 2015-04-12 LAB — LIPID PANEL
CHOL/HDL RATIO: 3.5 ratio (ref 0.0–4.4)
Cholesterol, Total: 134 mg/dL (ref 100–199)
HDL: 38 mg/dL — ABNORMAL LOW (ref >39)
LDL Calculated: 48 mg/dL (ref 0–99)
TRIGLYCERIDES: 242 mg/dL — AB (ref 0–149)
VLDL Cholesterol Cal: 48 mg/dL — ABNORMAL HIGH (ref 5–40)

## 2015-04-16 ENCOUNTER — Telehealth: Payer: Self-pay

## 2015-04-16 MED ORDER — TRAMADOL HCL 50 MG PO TABS: 50.0000 mg | ORAL_TABLET | Freq: Three times a day (TID) | ORAL | Status: DC | PRN

## 2015-04-16 NOTE — Telephone Encounter (Signed)
Patient needs refill of Tramadol

## 2015-04-30 ENCOUNTER — Other Ambulatory Visit: Payer: Self-pay | Admitting: Family Medicine

## 2015-05-08 ENCOUNTER — Other Ambulatory Visit: Payer: Self-pay | Admitting: Family Medicine

## 2015-06-06 ENCOUNTER — Encounter (INDEPENDENT_AMBULATORY_CARE_PROVIDER_SITE_OTHER): Payer: Self-pay

## 2015-06-07 ENCOUNTER — Encounter (INDEPENDENT_AMBULATORY_CARE_PROVIDER_SITE_OTHER): Payer: Self-pay

## 2015-06-07 ENCOUNTER — Encounter: Payer: Self-pay | Admitting: Family Medicine

## 2015-06-07 ENCOUNTER — Ambulatory Visit (INDEPENDENT_AMBULATORY_CARE_PROVIDER_SITE_OTHER): Payer: Commercial Managed Care - HMO | Admitting: Family Medicine

## 2015-06-07 VITALS — BP 138/84 | HR 76 | Temp 97.4°F | Ht 64.0 in | Wt 166.0 lb

## 2015-06-07 DIAGNOSIS — M19019 Primary osteoarthritis, unspecified shoulder: Secondary | ICD-10-CM | POA: Diagnosis not present

## 2015-06-07 DIAGNOSIS — F0390 Unspecified dementia without behavioral disturbance: Secondary | ICD-10-CM | POA: Diagnosis not present

## 2015-06-07 MED ORDER — SUVOREXANT 15 MG PO TABS: 1.0000 | ORAL_TABLET | Freq: Every evening | ORAL | Status: DC | PRN

## 2015-06-07 MED ORDER — SUVOREXANT 10 MG PO TABS: 1.0000 | ORAL_TABLET | Freq: Every evening | ORAL | Status: DC | PRN

## 2015-06-07 MED ORDER — TRAMADOL HCL 50 MG PO TABS: 50.0000 mg | ORAL_TABLET | Freq: Three times a day (TID) | ORAL | Status: DC | PRN

## 2015-06-07 MED ORDER — SUVOREXANT 20 MG PO TABS: 1.0000 | ORAL_TABLET | Freq: Every evening | ORAL | Status: DC | PRN

## 2015-06-07 NOTE — Progress Notes (Signed)
Subjective:    Patient ID: Linda Fletcher, female    DOB: 1940/01/17, 75 y.o.   MRN: [id number]  HPI Patient here today to discuss her chronic pain and possibly get a change in her Pain meds. She is accompanied today by her son.  When questioned about the pains she tends to downplay them but the son says she complains every day. She still spends most of her time in bed according to the son. I think it age 75 she does probably have some degenerative arthritic pains particularly in her knees but her exam does not indicate bad arthritis in her hands or shoulders     Patient Active Problem List   Diagnosis Date Noted  . Hyperlipidemia 09/25/2014  . Dementia   . Diabetes mellitus without complication [facility])    Outpatient Encounter Prescriptions as of 06/07/2015  Medication Sig  . ACCU-CHEK SOFTCLIX LANCETS lancets TEST BLOOD SUGAR TWICE DAILY  . Blood Glucose Monitoring Suppl (ACCU-CHEK AVIVA PLUS) W/DEVICE KIT Use to check sugar bid. DX E11.9  . calcium carbonate (OS-CAL) 600 MG TABS tablet Take 600 mg by mouth 2 (two) times daily with a meal.  . citalopram (CELEXA) 40 MG tablet TAKE 1 TABLET EVERY DAY  . donepezil (ARICEPT) 10 MG tablet Take 1 tablet (10 mg total) by mouth at bedtime.  . furosemide (LASIX) 40 MG tablet TAKE 1 TABLET EVERY DAY  . glipiZIDE (GLUCOTROL) 10 MG tablet TAKE 1 TABLET TWICE DAILY  . glucose blood (ACCU-CHEK AVIVA PLUS) test strip Check blood sugar bid. DX E11.9  . losartan (COZAAR) 50 MG tablet TAKE 1 TABLET EVERY DAY  . lovastatin (MEVACOR) 20 MG tablet TAKE 1 TABLET EVERY DAY  . Melatonin 1 MG CAPS Take by mouth.  . metFORMIN (GLUCOPHAGE) 500 MG tablet TAKE 1 TABLET IN THE MORNING AND TAKE 2 TABLETS IN THE EVENING  . omeprazole (PRILOSEC) 40 MG capsule Take 1 capsule (40 mg total) by mouth daily.  . Probiotic Product (PROBIOTIC DAILY PO) Take by mouth.  . traMADol (ULTRAM) 50 MG tablet Take 1 tablet (50 mg total) by mouth 3 (three) times daily as needed.  .  [DISCONTINUED] lansoprazole (PREVACID) 30 MG capsule TAKE 1 CAPSULE EVERY DAY  30  MINUTES BEFORE BREAKFAST   No facility-administered encounter medications on file as of 06/07/2015.      Review of Systems  Constitutional: Negative.   HENT: Negative.   Eyes: Negative.   Respiratory: Negative.   Cardiovascular: Negative.   Gastrointestinal: Negative.   Endocrine: Negative.   Genitourinary: Negative.   Musculoskeletal: Positive for arthralgias (bilateral shoulders and hands).  Skin: Negative.   Allergic/Immunologic: Negative.   Neurological: Negative.   Hematological: Negative.   Psychiatric/Behavioral: Negative.        Objective:   Physical Exam  Constitutional: She is oriented to person, place, and time. She appears well-developed and well-nourished.  Cardiovascular: Normal rate, regular rhythm and normal heart sounds.   Pulmonary/Chest: Effort normal and breath sounds normal.  Musculoskeletal:  Grip strength is normal. There are no deformities or swelling of the joints in the hands or fingers. Shoulders show good range of motion and strength There is no crepitance with extension and flexion of her knees  Neurological: She is alert and oriented to person, place, and time.   BP 138/84 mmHg  Pulse 76  Temp(Src) 97.4 F (36.3 C) (Oral)  Ht _0  (1.626 m)  Wt 166 lb (75.297 kg)  BMI 28.48 kg/m2  Assessment & Plan:  1. Dementia, without behavioral disturbance Dementia seems stable. As noted above her memory today is better than usual  2. Osteoarthritis of shoulder, unspecified laterality, unspecified osteoarthritis type We'll continue with tramadol as she does not admit to much pain and exam would not suggest any significant arthritis. I will try Belsomra to help her sleep since a lot of her pain occurs when laying on one side or the other  Wardell Honour MD

## 2015-06-18 DIAGNOSIS — E119 Type 2 diabetes mellitus without complications: Secondary | ICD-10-CM | POA: Diagnosis not present

## 2015-06-18 DIAGNOSIS — B351 Tinea unguium: Secondary | ICD-10-CM | POA: Diagnosis not present

## 2015-06-18 DIAGNOSIS — L84 Corns and callosities: Secondary | ICD-10-CM | POA: Diagnosis not present

## 2015-06-25 ENCOUNTER — Encounter: Payer: Self-pay | Admitting: Family Medicine

## 2015-06-25 ENCOUNTER — Ambulatory Visit (INDEPENDENT_AMBULATORY_CARE_PROVIDER_SITE_OTHER): Payer: Commercial Managed Care - HMO | Admitting: Family Medicine

## 2015-06-25 VITALS — BP 112/67 | HR 76 | Temp 97.8°F | Ht 64.0 in | Wt 167.0 lb

## 2015-06-25 DIAGNOSIS — M19019 Primary osteoarthritis, unspecified shoulder: Secondary | ICD-10-CM

## 2015-06-25 MED ORDER — DICLOFENAC SODIUM 75 MG PO TBEC: 75.0000 mg | DELAYED_RELEASE_TABLET | Freq: Two times a day (BID) | ORAL | Status: DC

## 2015-06-25 NOTE — Progress Notes (Signed)
Subjective:  Patient ID: Linda Fletcher, female    DOB: [birth year]  Age: 75 y.o. MRN: [id number]  CC: Arthritis   HPI Linda Fletcher presents for arthritis pain in the shoulders neck back and hands. tramadol doesn't work. Pain in right shoulder is 8/10. Son upset that Dr. Sabra Heck didn't give something stronger. ( His note reviewed.) Son takes oxycodone but he doesn't care what she gets as long as it works.She was concerned about constipation from opiates. Son says due to her dementia she was over focused on that. Son says naproxyn upset her stomach. OTC Ibuprofen didn't work. Pt Leaving for [place] in a few days and will be gone for a month.   History Linda Fletcher has a past medical history of Hyperlipidemia; Hypertension; Diabetes mellitus without complication ([place]); Dementia; GERD (gastroesophageal reflux disease); and Arthritis.   She has past surgical history that includes Thyroid cyst excision; Cholecystectomy; Abdominal hysterectomy; and Tonsillectomy and adenoidectomy.   Her family history is not on file.She reports that she quit smoking about 40 years ago. She has never used smokeless tobacco. She reports that she does not drink alcohol or use illicit drugs.    ROS Review of Systems  Constitutional: Negative for fever, activity change and appetite change.  HENT: Negative for congestion, rhinorrhea and sore throat.   Eyes: Negative for visual disturbance.  Respiratory: Negative for cough and shortness of breath.   Cardiovascular: Negative for chest pain and palpitations.  Gastrointestinal: Negative for nausea, abdominal pain and diarrhea.  Endocrine: Negative for polydipsia.  Genitourinary: Negative for dysuria.  Musculoskeletal: Positive for arthralgias. Negative for myalgias.    Objective:  BP 112/67 mmHg  Pulse 76  Temp(Src) 97.8 F (36.6 C) (Oral)  Ht 5\' 4"  (1.626 m)  Wt 167 lb (75.751 kg)  BMI 28.65 kg/m2  SpO2 98%  BP Readings from Last 3 Encounters:  06/25/15 112/67   06/07/15 138/84  04/11/15 96/57    Wt Readings from Last 3 Encounters:  06/25/15 167 lb (75.751 kg)  06/07/15 166 lb (75.297 kg)  04/11/15 173 lb 9.6 oz (78.744 kg)     Physical Exam  Constitutional: She is oriented to person, place, and time. She appears well-developed and well-nourished. No distress.  HENT:  Head: Normocephalic and atraumatic.  Eyes: Conjunctivae are normal. Pupils are equal, round, and reactive to light.  Neck: Normal range of motion. Neck supple. No thyromegaly present.  Cardiovascular: Normal rate, regular rhythm and normal heart sounds.   No murmur heard. Pulmonary/Chest: Effort normal and breath sounds normal. No respiratory distress. She has no wheezes. She has no rales.  Abdominal: Soft. Bowel sounds are normal. There is no tenderness.  Musculoskeletal: Normal range of motion.  Lymphadenopathy:    She has no cervical adenopathy.  Neurological: She is alert and oriented to person, place, and time.  Skin: Skin is warm and dry.  Psychiatric: She has a normal mood and affect. Her behavior is normal. Judgment and thought content normal.     Lab Results  Component Value Date   GLUCOSE 138* 04/11/2015   CHOL 134 04/11/2015   TRIG 242* 04/11/2015   HDL 38* 04/11/2015   LDLCALC 48 04/11/2015   ALT 14 04/11/2015   AST 19 04/11/2015   NA 141 04/11/2015   K 4.2 04/11/2015   CL 97 04/11/2015   CREATININE 0.78 04/11/2015   BUN 14 04/11/2015   CO2 26 04/11/2015   HGBA1C 6.0 01/09/2015    Patient was never admitted.  Assessment &  Plan:   Linda Fletcher was seen today for arthritis.  Diagnoses and all orders for this visit:  Osteoarthritis of shoulder, unspecified laterality, unspecified osteoarthritis type  Other orders -     Discontinue: diclofenac (VOLTAREN) 75 MG EC tablet; Take 1 tablet (75 mg total) by mouth 2 (two) times daily. -     diclofenac (VOLTAREN) 75 MG EC tablet; Take 1 tablet (75 mg total) by mouth 2 (two) times daily.   Drug seeking  by proxy via son.  I am having [name] maintain her calcium carbonate, Melatonin, Probiotic Product (PROBIOTIC DAILY PO), glucose blood, ACCU-CHEK AVIVA PLUS, donepezil, metFORMIN, glipiZIDE, furosemide, losartan, omeprazole, citalopram, ACCU-CHEK SOFTCLIX LANCETS, lovastatin, Suvorexant, Suvorexant, Suvorexant, traMADol, and diclofenac.  Meds ordered this encounter  Medications  . DISCONTD: diclofenac (VOLTAREN) 75 MG EC tablet    Sig: Take 1 tablet (75 mg total) by mouth 2 (two) times daily.    Dispense:  60 tablet    Refill:  2  . diclofenac (VOLTAREN) 75 MG EC tablet    Sig: Take 1 tablet (75 mg total) by mouth 2 (two) times daily.    Dispense:  60 tablet    Refill:  2     Follow-up: Return with Dr. Sabra Heck.  Claretta Fraise, M.D.

## 2015-07-10 ENCOUNTER — Other Ambulatory Visit: Payer: Self-pay | Admitting: Family Medicine

## 2015-07-18 ENCOUNTER — Other Ambulatory Visit: Payer: Self-pay | Admitting: Family Medicine

## 2015-07-30 ENCOUNTER — Other Ambulatory Visit: Payer: Self-pay | Admitting: Family Medicine

## 2015-08-16 DIAGNOSIS — F332 Major depressive disorder, recurrent severe without psychotic features: Secondary | ICD-10-CM | POA: Diagnosis not present

## 2015-08-16 DIAGNOSIS — E785 Hyperlipidemia, unspecified: Secondary | ICD-10-CM | POA: Diagnosis not present

## 2015-08-16 DIAGNOSIS — E119 Type 2 diabetes mellitus without complications: Secondary | ICD-10-CM | POA: Diagnosis not present

## 2015-08-16 DIAGNOSIS — Z915 Personal history of self-harm: Secondary | ICD-10-CM | POA: Diagnosis not present

## 2015-08-16 DIAGNOSIS — R4182 Altered mental status, unspecified: Secondary | ICD-10-CM | POA: Diagnosis not present

## 2015-08-16 DIAGNOSIS — I10 Essential (primary) hypertension: Secondary | ICD-10-CM | POA: Diagnosis not present

## 2015-08-16 DIAGNOSIS — Z79899 Other long term (current) drug therapy: Secondary | ICD-10-CM | POA: Diagnosis not present

## 2015-08-16 DIAGNOSIS — N390 Urinary tract infection, site not specified: Secondary | ICD-10-CM | POA: Diagnosis not present

## 2015-08-16 DIAGNOSIS — K219 Gastro-esophageal reflux disease without esophagitis: Secondary | ICD-10-CM | POA: Diagnosis not present

## 2015-08-16 DIAGNOSIS — F319 Bipolar disorder, unspecified: Secondary | ICD-10-CM | POA: Diagnosis not present

## 2015-08-16 DIAGNOSIS — F22 Delusional disorders: Secondary | ICD-10-CM | POA: Diagnosis not present

## 2015-08-16 DIAGNOSIS — R45851 Suicidal ideations: Secondary | ICD-10-CM | POA: Diagnosis not present

## 2015-08-16 DIAGNOSIS — Z87891 Personal history of nicotine dependence: Secondary | ICD-10-CM | POA: Diagnosis not present

## 2015-08-17 DIAGNOSIS — R45851 Suicidal ideations: Secondary | ICD-10-CM | POA: Diagnosis not present

## 2015-08-18 DIAGNOSIS — K219 Gastro-esophageal reflux disease without esophagitis: Secondary | ICD-10-CM | POA: Diagnosis not present

## 2015-08-18 DIAGNOSIS — M1990 Unspecified osteoarthritis, unspecified site: Secondary | ICD-10-CM | POA: Diagnosis not present

## 2015-08-18 DIAGNOSIS — Z79899 Other long term (current) drug therapy: Secondary | ICD-10-CM | POA: Diagnosis not present

## 2015-08-18 DIAGNOSIS — T1491 Suicide attempt: Secondary | ICD-10-CM | POA: Diagnosis not present

## 2015-08-18 DIAGNOSIS — Z915 Personal history of self-harm: Secondary | ICD-10-CM | POA: Diagnosis not present

## 2015-08-18 DIAGNOSIS — F339 Major depressive disorder, recurrent, unspecified: Secondary | ICD-10-CM | POA: Diagnosis not present

## 2015-08-18 DIAGNOSIS — E119 Type 2 diabetes mellitus without complications: Secondary | ICD-10-CM | POA: Diagnosis not present

## 2015-08-18 DIAGNOSIS — E785 Hyperlipidemia, unspecified: Secondary | ICD-10-CM | POA: Diagnosis not present

## 2015-08-18 DIAGNOSIS — N390 Urinary tract infection, site not specified: Secondary | ICD-10-CM | POA: Diagnosis not present

## 2015-08-18 DIAGNOSIS — Z87891 Personal history of nicotine dependence: Secondary | ICD-10-CM | POA: Diagnosis not present

## 2015-08-18 DIAGNOSIS — I10 Essential (primary) hypertension: Secondary | ICD-10-CM | POA: Diagnosis not present

## 2015-08-18 DIAGNOSIS — F332 Major depressive disorder, recurrent severe without psychotic features: Secondary | ICD-10-CM | POA: Diagnosis not present

## 2015-08-18 DIAGNOSIS — R4182 Altered mental status, unspecified: Secondary | ICD-10-CM | POA: Diagnosis not present

## 2015-08-18 DIAGNOSIS — E11649 Type 2 diabetes mellitus with hypoglycemia without coma: Secondary | ICD-10-CM | POA: Diagnosis not present

## 2015-08-20 ENCOUNTER — Ambulatory Visit: Payer: Commercial Managed Care - HMO | Admitting: Family Medicine

## 2015-09-03 ENCOUNTER — Other Ambulatory Visit: Payer: Self-pay | Admitting: Family Medicine

## 2015-09-04 ENCOUNTER — Other Ambulatory Visit: Payer: Self-pay | Admitting: Family Medicine

## 2015-09-05 NOTE — Telephone Encounter (Signed)
[facility]  

## 2015-09-17 ENCOUNTER — Other Ambulatory Visit: Payer: Self-pay | Admitting: Family Medicine

## 2015-09-26 ENCOUNTER — Encounter: Payer: Self-pay | Admitting: Family Medicine

## 2015-09-26 ENCOUNTER — Ambulatory Visit (INDEPENDENT_AMBULATORY_CARE_PROVIDER_SITE_OTHER): Payer: Commercial Managed Care - HMO | Admitting: Family Medicine

## 2015-09-26 VITALS — BP 134/73 | HR 75 | Temp 97.8°F | Ht 64.0 in | Wt 172.0 lb

## 2015-09-26 DIAGNOSIS — I10 Essential (primary) hypertension: Secondary | ICD-10-CM

## 2015-09-26 DIAGNOSIS — K219 Gastro-esophageal reflux disease without esophagitis: Secondary | ICD-10-CM | POA: Diagnosis not present

## 2015-09-26 DIAGNOSIS — F329 Major depressive disorder, single episode, unspecified: Secondary | ICD-10-CM

## 2015-09-26 DIAGNOSIS — F339 Major depressive disorder, recurrent, unspecified: Secondary | ICD-10-CM | POA: Insufficient documentation

## 2015-09-26 DIAGNOSIS — E1159 Type 2 diabetes mellitus with other circulatory complications: Secondary | ICD-10-CM | POA: Insufficient documentation

## 2015-09-26 DIAGNOSIS — F0390 Unspecified dementia without behavioral disturbance: Secondary | ICD-10-CM | POA: Diagnosis not present

## 2015-09-26 DIAGNOSIS — F418 Other specified anxiety disorders: Secondary | ICD-10-CM

## 2015-09-26 DIAGNOSIS — E119 Type 2 diabetes mellitus without complications: Secondary | ICD-10-CM | POA: Diagnosis not present

## 2015-09-26 DIAGNOSIS — F419 Anxiety disorder, unspecified: Secondary | ICD-10-CM

## 2015-09-26 DIAGNOSIS — E785 Hyperlipidemia, unspecified: Secondary | ICD-10-CM | POA: Diagnosis not present

## 2015-09-26 DIAGNOSIS — F32A Depression, unspecified: Secondary | ICD-10-CM

## 2015-09-26 LAB — BAYER DCA HB A1C WAIVED: HB A1C: 5.8 % (ref <7.0)

## 2015-09-26 NOTE — Progress Notes (Signed)
BP 134/73   Pulse 75   Temp 97.8 F (36.6 C) (Oral)   Ht '5\' 4"'$  (1.626 m)   Wt 172 lb (78 kg)   BMI 29.52 kg/m    Subjective:    Patient ID: Linda Fletcher, female    DOB: 07-10-1940, 75 y.o.   MRN: [id number]  HPI: Linda Fletcher is a 75 y.o. female presenting on 09/26/2015 for Diabetes (3 month followup); Hyperlipidemia; and Dementia   HPI Type 2 diabetes recheck Patient complains of the rare hypoglycemic episodes and that she does get rare hyperglycemic at times depending on what she eats as well. She is currently on glipizide and metformin for her diabetes. She says for the most part she runs about 120-140. She denies any issues with pain in her feet or neuropathy. Her last ophthalmology visit was in October 2016 so she will be due next month for this. Patient is currently on an arb/losartan.  GERD Patient is on omeprazole for her GERD. She says it controls her symptoms very well. She denies any blood in her stool or dark stool.  Dementia and anxiety depression Patient is currently on Aricept and Celexa for dementia and mood stabilization. Her and her son here today with [place] both feel like they're doing well for her. She feels like she is getting significant amount of benefit out of the Aricept and denies any major issues with constipation or dry mouth. She denies any suicidal ideations and says she is sleeping okay at night using the melatonin right now currently.  Hypertension and hyperlipidemia recheck Patient is on losartan for hypertension and lovastatin for cholesterol and denies any issues or side effects from either. Her blood pressure today is 1 3473. Patient denies headaches, blurred vision, chest pains, shortness of breath, or weakness. Denies any side effects from medication and is content with current medication.   Relevant past medical, surgical, family and social history reviewed and updated as indicated. Interim medical history since our last visit reviewed. Allergies  and medications reviewed and updated.  Review of Systems  Constitutional: Negative for chills and fever.  HENT: Negative for congestion, ear discharge and ear pain.   Eyes: Negative for pain, redness and visual disturbance.  Respiratory: Negative for cough, chest tightness, shortness of breath and wheezing.   Cardiovascular: Negative for chest pain, palpitations and leg swelling.  Gastrointestinal: Negative for abdominal pain, blood in stool, constipation and diarrhea.  Genitourinary: Negative for difficulty urinating, dysuria and hematuria.  Musculoskeletal: Negative for back pain, gait problem and myalgias.  Skin: Negative for rash.  Neurological: Negative for dizziness, weakness, light-headedness and headaches.  Psychiatric/Behavioral: Positive for dysphoric mood and sleep disturbance. Negative for agitation, behavioral problems, self-injury and suicidal ideas. The patient is nervous/anxious.   All other systems reviewed and are negative.   Per HPI unless specifically indicated above     Medication List       Accurate as of 09/26/15  9:15 AM. Always use your most recent med list.          ACCU-CHEK AVIVA PLUS test strip Generic drug:  glucose blood CHECK BLOOD SUGAR TWICE DAILY   ACCU-CHEK AVIVA PLUS w/Device Kit Use to check sugar bid. DX E11.9   ACCU-CHEK SOFTCLIX LANCETS lancets TEST BLOOD SUGAR TWICE DAILY   calcium carbonate 600 MG Tabs tablet Commonly known as:  OS-CAL Take 600 mg by mouth 2 (two) times daily with a meal.   citalopram 40 MG tablet Commonly known as:  CELEXA TAKE 1  TABLET EVERY DAY   donepezil 10 MG tablet Commonly known as:  ARICEPT TAKE 1 TABLET AT BEDTIME   furosemide 40 MG tablet Commonly known as:  LASIX TAKE 1 TABLET EVERY DAY   glipiZIDE 10 MG tablet Commonly known as:  GLUCOTROL TAKE 1 TABLET TWICE DAILY   losartan 50 MG tablet Commonly known as:  COZAAR TAKE 1 TABLET EVERY DAY   lovastatin 20 MG tablet Commonly known as:   MEVACOR TAKE 1 TABLET EVERY DAY   Melatonin 1 MG Caps Take by mouth.   metFORMIN 500 MG tablet Commonly known as:  GLUCOPHAGE TAKE 1 TABLET IN THE MORNING AND TAKE 2 TABLETS IN THE EVENING   omeprazole 40 MG capsule Commonly known as:  PRILOSEC Take 1 capsule (40 mg total) by mouth daily.   PROBIOTIC DAILY PO Take by mouth.          Objective:    BP 134/73   Pulse 75   Temp 97.8 F (36.6 C) (Oral)   Ht '5\' 4"'$  (1.626 m)   Wt 172 lb (78 kg)   BMI 29.52 kg/m   Wt Readings from Last 3 Encounters:  09/26/15 172 lb (78 kg)  06/25/15 167 lb (75.8 kg)  06/07/15 166 lb (75.3 kg)    Physical Exam  Constitutional: She is oriented to person, place, and time. She appears well-developed and well-nourished. No distress.  Eyes: Conjunctivae are normal.  Neck: Neck supple. No thyromegaly present.  Cardiovascular: Normal rate, regular rhythm, normal heart sounds and intact distal pulses.   No murmur heard. Pulmonary/Chest: Effort normal and breath sounds normal. No respiratory distress. She has no wheezes.  Abdominal: Soft. Bowel sounds are normal. She exhibits no distension. There is no tenderness. There is no rebound.  Musculoskeletal: Normal range of motion. She exhibits no edema or tenderness.  Lymphadenopathy:    She has no cervical adenopathy.  Neurological: She is alert and oriented to person, place, and time. Coordination normal.  Skin: Skin is warm and dry. No rash noted. She is not diaphoretic.  Psychiatric: Her behavior is normal. Her mood appears anxious. Cognition and memory are impaired. She exhibits a depressed mood. She expresses no suicidal ideation. She expresses no suicidal plans.  Nursing note and vitals reviewed.   Results for orders placed or performed in visit on 04/22/15  HM DIABETES EYE EXAM  Result Value Ref Range   HM Diabetic Eye Exam Retinopathy (A) No Retinopathy   Diabetic Foot Exam - Simple   Simple Foot Form Diabetic Foot exam was performed  with the following findings:  Yes 09/26/2015  9:18 AM  Visual Inspection No deformities, no ulcerations, no other skin breakdown bilaterally:  Yes Sensation Testing Intact to touch and monofilament testing bilaterally:  Yes Pulse Check Posterior Tibialis and Dorsalis pulse intact bilaterally:  Yes Comments        Assessment & Plan:   Problem List Items Addressed This Visit      Cardiovascular and Mediastinum   Essential hypertension, benign   Relevant Orders   CMP14+EGFR   Lipid panel   Microalbumin / creatinine urine ratio     Digestive   GERD (gastroesophageal reflux disease)   Relevant Orders   CMP14+EGFR     Endocrine   Diabetes mellitus without complication ([place]) - Primary   Relevant Orders   Bayer DCA Hb A1c Waived   Lipid panel   Microalbumin / creatinine urine ratio     Nervous and Auditory   Dementia  Other   Hyperlipidemia   Relevant Orders   Lipid panel   Anxiety and depression   Relevant Orders   CBC with Differential/Platelet    Other Visit Diagnoses   None.      Follow up plan: No Follow-up on file.  Counseling provided for all of the vaccine components Orders Placed This Encounter  Procedures  . Bayer DCA Hb A1c Waived  . CMP14+EGFR  . Lipid panel  . CBC with Differential/Platelet  . Microalbumin / creatinine urine ratio    Caryl Pina, MD [facility] 09/26/2015, 9:15 AM

## 2015-09-27 LAB — CMP14+EGFR
ALT: 21 IU/L (ref 0–32)
AST: 21 IU/L (ref 0–40)
Albumin/Globulin Ratio: 1.5 (ref 1.2–2.2)
Albumin: 4 g/dL (ref 3.5–4.8)
Alkaline Phosphatase: 78 IU/L (ref 39–117)
BUN/Creatinine Ratio: 19 (ref 12–28)
BUN: 16 mg/dL (ref 8–27)
Bilirubin Total: 0.4 mg/dL (ref 0.0–1.2)
CALCIUM: 9.6 mg/dL (ref 8.7–10.3)
CO2: 26 mmol/L (ref 18–29)
Chloride: 101 mmol/L (ref 96–106)
Creatinine, Ser: 0.84 mg/dL (ref 0.57–1.00)
GFR, EST AFRICAN AMERICAN: 78 mL/min/{1.73_m2} (ref >59)
GFR, EST NON AFRICAN AMERICAN: 68 mL/min/{1.73_m2} (ref >59)
GLUCOSE: 118 mg/dL — AB (ref 65–99)
Globulin, Total: 2.7 g/dL (ref 1.5–4.5)
Potassium: 4.1 mmol/L (ref 3.5–5.2)
Sodium: 144 mmol/L (ref 134–144)
TOTAL PROTEIN: 6.7 g/dL (ref 6.0–8.5)

## 2015-09-27 LAB — MICROALBUMIN / CREATININE URINE RATIO
Creatinine, Urine: 221.5 mg/dL
MICROALB/CREAT RATIO: 6.6 mg/g{creat} (ref 0.0–30.0)
Microalbumin, Urine: 14.7 ug/mL

## 2015-09-27 LAB — CBC WITH DIFFERENTIAL/PLATELET
BASOS ABS: 0 10*3/uL (ref 0.0–0.2)
Basos: 1 %
EOS (ABSOLUTE): 0.2 10*3/uL (ref 0.0–0.4)
EOS: 3 %
HEMATOCRIT: 34.1 % (ref 34.0–46.6)
HEMOGLOBIN: 11.3 g/dL (ref 11.1–15.9)
Immature Grans (Abs): 0 10*3/uL (ref 0.0–0.1)
Immature Granulocytes: 0 %
LYMPHS ABS: 1.3 10*3/uL (ref 0.7–3.1)
Lymphs: 20 %
MCH: 26.4 pg — ABNORMAL LOW (ref 26.6–33.0)
MCHC: 33.1 g/dL (ref 31.5–35.7)
MCV: 80 fL (ref 79–97)
MONOCYTES: 6 %
MONOS ABS: 0.4 10*3/uL (ref 0.1–0.9)
NEUTROS ABS: 4.5 10*3/uL (ref 1.4–7.0)
Neutrophils: 70 %
Platelets: 193 10*3/uL (ref 150–379)
RBC: 4.28 x10E6/uL (ref 3.77–5.28)
RDW: 15.9 % — AB (ref 12.3–15.4)
WBC: 6.4 10*3/uL (ref 3.4–10.8)

## 2015-09-27 LAB — LIPID PANEL
CHOL/HDL RATIO: 4.3 ratio (ref 0.0–4.4)
Cholesterol, Total: 188 mg/dL (ref 100–199)
HDL: 44 mg/dL (ref >39)
LDL Calculated: 104 mg/dL — ABNORMAL HIGH (ref 0–99)
TRIGLYCERIDES: 202 mg/dL — AB (ref 0–149)
VLDL CHOLESTEROL CAL: 40 mg/dL (ref 5–40)

## 2015-11-13 ENCOUNTER — Other Ambulatory Visit: Payer: Self-pay | Admitting: Family Medicine

## 2015-11-18 ENCOUNTER — Other Ambulatory Visit: Payer: Self-pay | Admitting: Family Medicine

## 2015-12-13 ENCOUNTER — Ambulatory Visit (INDEPENDENT_AMBULATORY_CARE_PROVIDER_SITE_OTHER): Payer: Commercial Managed Care - HMO

## 2015-12-13 DIAGNOSIS — Z23 Encounter for immunization: Secondary | ICD-10-CM | POA: Diagnosis not present

## 2016-01-29 ENCOUNTER — Other Ambulatory Visit: Payer: Self-pay | Admitting: Family Medicine

## 2016-01-30 ENCOUNTER — Other Ambulatory Visit: Payer: Self-pay | Admitting: Family Medicine

## 2016-01-30 ENCOUNTER — Other Ambulatory Visit (INDEPENDENT_AMBULATORY_CARE_PROVIDER_SITE_OTHER): Payer: Self-pay | Admitting: Internal Medicine

## 2016-01-30 DIAGNOSIS — K219 Gastro-esophageal reflux disease without esophagitis: Secondary | ICD-10-CM

## 2016-02-20 ENCOUNTER — Encounter (INDEPENDENT_AMBULATORY_CARE_PROVIDER_SITE_OTHER): Payer: Self-pay

## 2016-02-20 ENCOUNTER — Encounter: Payer: Self-pay | Admitting: Family Medicine

## 2016-02-20 ENCOUNTER — Ambulatory Visit (INDEPENDENT_AMBULATORY_CARE_PROVIDER_SITE_OTHER): Payer: Medicare HMO | Admitting: Family Medicine

## 2016-02-20 VITALS — BP 138/80 | HR 68 | Temp 97.0°F | Ht 64.0 in | Wt 179.2 lb

## 2016-02-20 DIAGNOSIS — K219 Gastro-esophageal reflux disease without esophagitis: Secondary | ICD-10-CM

## 2016-02-20 DIAGNOSIS — E119 Type 2 diabetes mellitus without complications: Secondary | ICD-10-CM | POA: Diagnosis not present

## 2016-02-20 DIAGNOSIS — E782 Mixed hyperlipidemia: Secondary | ICD-10-CM

## 2016-02-20 DIAGNOSIS — I10 Essential (primary) hypertension: Secondary | ICD-10-CM

## 2016-02-20 LAB — BAYER DCA HB A1C WAIVED: HB A1C: 5.4 % (ref <7.0)

## 2016-02-20 LAB — LIPID PANEL
CHOL/HDL RATIO: 4.3 ratio (ref 0.0–4.4)
Cholesterol, Total: 189 mg/dL (ref 100–199)
HDL: 44 mg/dL (ref >39)
LDL CALC: 110 mg/dL — AB (ref 0–99)
Triglycerides: 176 mg/dL — ABNORMAL HIGH (ref 0–149)
VLDL CHOLESTEROL CAL: 35 mg/dL (ref 5–40)

## 2016-02-20 LAB — CMP14+EGFR
ALBUMIN: 4.1 g/dL (ref 3.5–4.8)
ALT: 24 IU/L (ref 0–32)
AST: 25 IU/L (ref 0–40)
Albumin/Globulin Ratio: 1.5 (ref 1.2–2.2)
Alkaline Phosphatase: 90 IU/L (ref 39–117)
BUN/Creatinine Ratio: 22 (ref 12–28)
BUN: 17 mg/dL (ref 8–27)
Bilirubin Total: 0.3 mg/dL (ref 0.0–1.2)
CALCIUM: 9.2 mg/dL (ref 8.7–10.3)
CO2: 24 mmol/L (ref 18–29)
CREATININE: 0.76 mg/dL (ref 0.57–1.00)
Chloride: 101 mmol/L (ref 96–106)
GFR, EST AFRICAN AMERICAN: 88 mL/min/{1.73_m2} (ref >59)
GFR, EST NON AFRICAN AMERICAN: 76 mL/min/{1.73_m2} (ref >59)
GLUCOSE: 150 mg/dL — AB (ref 65–99)
Globulin, Total: 2.8 g/dL (ref 1.5–4.5)
Potassium: 4.1 mmol/L (ref 3.5–5.2)
Sodium: 143 mmol/L (ref 134–144)
TOTAL PROTEIN: 6.9 g/dL (ref 6.0–8.5)

## 2016-02-20 MED ORDER — FUROSEMIDE 40 MG PO TABS: 40.0000 mg | ORAL_TABLET | Freq: Every day | ORAL | 2 refills | Status: DC

## 2016-02-20 MED ORDER — DONEPEZIL HCL 10 MG PO TABS: 10.0000 mg | ORAL_TABLET | Freq: Every day | ORAL | 2 refills | Status: DC

## 2016-02-20 MED ORDER — LOVASTATIN 20 MG PO TABS: 20.0000 mg | ORAL_TABLET | Freq: Every day | ORAL | 2 refills | Status: DC

## 2016-02-20 MED ORDER — METFORMIN HCL 500 MG PO TABS: ORAL_TABLET | ORAL | 2 refills | Status: DC

## 2016-02-20 MED ORDER — OMEPRAZOLE 40 MG PO CPDR: 40.0000 mg | DELAYED_RELEASE_CAPSULE | Freq: Every day | ORAL | 2 refills | Status: DC

## 2016-02-20 MED ORDER — CITALOPRAM HYDROBROMIDE 40 MG PO TABS: 40.0000 mg | ORAL_TABLET | Freq: Every day | ORAL | 2 refills | Status: DC

## 2016-02-25 NOTE — Progress Notes (Signed)
BP 138/80   Pulse 68   Temp 97 F (36.1 C) (Oral)   Ht '5\' 4"'$  (1.626 m)   Wt 179 lb 3.2 oz (81.3 kg)   BMI 30.76 kg/m    Subjective:    Patient ID: Linda Fletcher, female    DOB: 1941-01-12, 76 y.o.   MRN: [id number]  HPI: Linda Fletcher is a 76 y.o. female presenting on 02/20/2016 for Medication Refill (all meds to [facility] mail order)   HPI Hypertension recheck Patient is coming in for hypertension recheck. She is currently on losartan. Her blood pressure today is 138/80. Patient denies headaches, blurred vision, chest pains, shortness of breath, or weakness. Denies any side effects from medication and is content with current medication.   Type 2 diabetes recheck Patient is coming in for a diabetes recheck. She is currently on metformin 500 one in the a.m. and 2 in the evening. She denies any new issues with her feet. She has not seen an ophthalmologist yet this year. She says her blood sugars been running between 80 and 110 in the mornings and she has not been having too many issues with them. She is on an angiotensin receptor blocker. Patient was on metformin 500 twice a day and glipizide but she did have a couple of low blood sugars and we are recommending to stop the glipizide and increase the metformin by 1 more 500 mg per day.  Hyperlipidemia recheck Patient is currently on lovastatin and she denies any issues with medication or myalgias. She is here for cholesterol recheck.  GERD recheck Patient is coming in for recheck and refill of her medication for acid reflux. She is on omeprazole and has been doing well on it. She denies any blood in her stool or symptomatic heartburn.  Relevant past medical, surgical, family and social history reviewed and updated as indicated. Interim medical history since our last visit reviewed. Allergies and medications reviewed and updated.  Review of Systems  Constitutional: Negative for chills and fever.  HENT: Negative for congestion, ear  discharge and ear pain.   Eyes: Negative for redness and visual disturbance.  Respiratory: Negative for chest tightness and shortness of breath.   Cardiovascular: Negative for chest pain, palpitations and leg swelling.  Genitourinary: Negative for difficulty urinating and dysuria.  Musculoskeletal: Negative for back pain and gait problem.  Skin: Negative for rash.  Neurological: Negative for dizziness, weakness, light-headedness, numbness and headaches.  Psychiatric/Behavioral: Negative for agitation and behavioral problems.  All other systems reviewed and are negative.   Per HPI unless specifically indicated above     Objective:    BP 138/80   Pulse 68   Temp 97 F (36.1 C) (Oral)   Ht '5\' 4"'$  (1.626 m)   Wt 179 lb 3.2 oz (81.3 kg)   BMI 30.76 kg/m   Wt Readings from Last 3 Encounters:  02/20/16 179 lb 3.2 oz (81.3 kg)  09/26/15 172 lb (78 kg)  06/25/15 167 lb (75.8 kg)    Physical Exam  Constitutional: She is oriented to person, place, and time. She appears well-developed and well-nourished. No distress.  Eyes: Conjunctivae are normal.  Neck: Neck supple. No thyromegaly present.  Cardiovascular: Normal rate, regular rhythm, normal heart sounds and intact distal pulses.   No murmur heard. Pulmonary/Chest: Effort normal and breath sounds normal. No respiratory distress. She has no wheezes.  Musculoskeletal: Normal range of motion. She exhibits no edema or tenderness.  Lymphadenopathy:    She has no cervical adenopathy.  Neurological: She is alert and oriented to person, place, and time. Coordination normal.  Skin: Skin is warm and dry. No rash noted. She is not diaphoretic.  Psychiatric: She has a normal mood and affect. Her behavior is normal.  Nursing note and vitals reviewed.     Assessment & Plan:   Problem List Items Addressed This Visit      Cardiovascular and Mediastinum   Essential hypertension, benign - Primary   Relevant Medications   furosemide (LASIX) 40  MG tablet   lovastatin (MEVACOR) 20 MG tablet   Other Relevant Orders   CMP14+EGFR (Completed)   Lipid panel (Completed)   Bayer DCA Hb A1c Waived (Completed)     Digestive   GERD (gastroesophageal reflux disease)   Relevant Medications   omeprazole (PRILOSEC) 40 MG capsule     Endocrine   Diabetes mellitus without complication (HCC)   Relevant Medications   metFORMIN (GLUCOPHAGE) 500 MG tablet   lovastatin (MEVACOR) 20 MG tablet   Other Relevant Orders   Bayer DCA Hb A1c Waived (Completed)     Other   Hyperlipidemia   Relevant Medications   furosemide (LASIX) 40 MG tablet   lovastatin (MEVACOR) 20 MG tablet   Other Relevant Orders   Lipid panel (Completed)       Follow up plan: Return in about 3 months (around 05/19/2016), or if symptoms worsen or fail to improve, for Follow-up hypertension and cholesterol.  Counseling provided for all of the vaccine components Orders Placed This Encounter  Procedures  . CMP14+EGFR  . Lipid panel  . Bayer [facility] Hb A1c Warner, MD [facility] 02/25/2016, 9:42 PM

## 2016-04-29 ENCOUNTER — Other Ambulatory Visit: Payer: Self-pay | Admitting: Family Medicine

## 2016-06-24 ENCOUNTER — Ambulatory Visit: Payer: Medicare HMO | Admitting: Family Medicine

## 2016-06-25 ENCOUNTER — Telehealth: Payer: Self-pay | Admitting: Family Medicine

## 2016-06-25 ENCOUNTER — Encounter: Payer: Self-pay | Admitting: Family Medicine

## 2016-06-25 NOTE — Telephone Encounter (Signed)
Patient was hospitalized, will call to reschedule

## 2016-08-04 ENCOUNTER — Encounter: Payer: Self-pay | Admitting: Family Medicine

## 2016-08-04 ENCOUNTER — Ambulatory Visit (INDEPENDENT_AMBULATORY_CARE_PROVIDER_SITE_OTHER): Payer: Medicare HMO | Admitting: Family Medicine

## 2016-08-04 VITALS — BP 132/89 | HR 66 | Temp 97.7°F | Ht 64.0 in | Wt 180.0 lb

## 2016-08-04 DIAGNOSIS — Z85818 Personal history of malignant neoplasm of other sites of lip, oral cavity, and pharynx: Secondary | ICD-10-CM

## 2016-08-04 DIAGNOSIS — F329 Major depressive disorder, single episode, unspecified: Secondary | ICD-10-CM

## 2016-08-04 DIAGNOSIS — I10 Essential (primary) hypertension: Secondary | ICD-10-CM | POA: Diagnosis not present

## 2016-08-04 DIAGNOSIS — E782 Mixed hyperlipidemia: Secondary | ICD-10-CM | POA: Diagnosis not present

## 2016-08-04 DIAGNOSIS — F419 Anxiety disorder, unspecified: Secondary | ICD-10-CM | POA: Diagnosis not present

## 2016-08-04 DIAGNOSIS — F32A Depression, unspecified: Secondary | ICD-10-CM

## 2016-08-04 DIAGNOSIS — E119 Type 2 diabetes mellitus without complications: Secondary | ICD-10-CM

## 2016-08-04 DIAGNOSIS — K219 Gastro-esophageal reflux disease without esophagitis: Secondary | ICD-10-CM | POA: Diagnosis not present

## 2016-08-04 DIAGNOSIS — Z23 Encounter for immunization: Secondary | ICD-10-CM | POA: Diagnosis not present

## 2016-08-04 MED ORDER — OMEPRAZOLE 40 MG PO CPDR: 40.0000 mg | DELAYED_RELEASE_CAPSULE | Freq: Every day | ORAL | 2 refills | Status: DC

## 2016-08-04 MED ORDER — METFORMIN HCL 500 MG PO TABS: ORAL_TABLET | ORAL | 2 refills | Status: DC

## 2016-08-04 MED ORDER — LOSARTAN POTASSIUM 50 MG PO TABS: 50.0000 mg | ORAL_TABLET | Freq: Every day | ORAL | 2 refills | Status: DC

## 2016-08-04 MED ORDER — DONEPEZIL HCL 10 MG PO TABS: 10.0000 mg | ORAL_TABLET | Freq: Every day | ORAL | 2 refills | Status: DC

## 2016-08-04 MED ORDER — LOVASTATIN 20 MG PO TABS: 20.0000 mg | ORAL_TABLET | Freq: Every day | ORAL | 2 refills | Status: DC

## 2016-08-04 MED ORDER — GLIPIZIDE 10 MG PO TABS: 10.0000 mg | ORAL_TABLET | Freq: Two times a day (BID) | ORAL | 2 refills | Status: DC

## 2016-08-04 MED ORDER — CITALOPRAM HYDROBROMIDE 40 MG PO TABS: 40.0000 mg | ORAL_TABLET | Freq: Every day | ORAL | 2 refills | Status: DC

## 2016-08-04 MED ORDER — FUROSEMIDE 40 MG PO TABS: 40.0000 mg | ORAL_TABLET | Freq: Every day | ORAL | 2 refills | Status: DC

## 2016-08-04 NOTE — Progress Notes (Signed)
BP 132/89   Pulse 66   Temp 97.7 F (36.5 C) (Oral)   Ht 5' 4" (1.626 m)   Wt 180 lb (81.6 kg)   BMI 30.90 kg/m    Subjective:    Patient ID: Linda Fletcher, female    DOB: 20-May-1940, 76 y.o.   MRN: [id number]  HPI: Linda Fletcher is a 76 y.o. female presenting on 08/04/2016 for Diabetes; Hyperlipidemia; Hypertension; and Referral (ENT & opthamologist)   HPI Type 2 diabetes mellitus Patient comes in today for recheck of her diabetes. Patient has been currently taking metformin and glipizide, we'll stop the glipizide because she was having low blood sugars and had an A1c of 5.4 but per them her blood sugar started creeping up and she got into the 400s and so they restarted the glipizide. Now she says that she had a blood sugar of 122 this morning and averages about 145.Marland Kitchen Patient is currently on an ACE inhibitor/ARB. Patient has seen an ophthalmologist this year. Patient denies any issues with their feet.   Hypertension Patient is currently on losartan, and their blood pressure today is 132/89. Patient denies any lightheadedness or dizziness. Patient denies headaches, blurred vision, chest pains, shortness of breath, or weakness. Denies any side effects from medication and is content with current medication.   Hyperlipidemia Patient is coming in for recheck of his hyperlipidemia. The patient is currently taking lovastatin. They deny any issues with myalgias or history of liver damage from it. They deny any focal numbness or weakness or chest pain.   Anxiety and depression and dementia recheck The patient is very happy with their medication currently and feels like it is working very well for them. The patient is not having any overbearing or controlling episodes of anxiety or depression like they were previously. The patient is sleeping at night well. They deny any suicidal ideations or thoughts of hurting themself. They deny any side effects from the medication.  Depression screen [facility] 2/9  08/04/2016 02/20/2016 09/26/2015 06/25/2015 06/07/2015  Decreased Interest 0 0 2 2 0  Down, Depressed, Hopeless 0 0 _0 PHQ - 2 Score 0 0 _1 Altered sleeping - - 2 2 -  Tired, decreased energy - - 2 2 -  Change in appetite - - 0 0 -  Feeling bad or failure about yourself  - - 2 2 -  Trouble concentrating - - 0 2 -  Moving slowly or fidgety/restless - - 0 0 -  Suicidal thoughts - - 2 1 -  PHQ-9 Score - - 12 13 -  Difficult doing work/chores - - Somewhat difficult Not difficult at all -    GERD   Patient is coming in for GERD recheck and says that she does have some indigestion every now and then at night but not all the time and doesn't hurt or bother her that much just wanted to know what was happening. She is taking the omeprazole 40 daily. She denies any blood in her stool or fevers or chills. She denies any pain going anywhere else.  Relevant past medical, surgical, family and social history reviewed and updated as indicated. Interim medical history since our last visit reviewed. Allergies and medications reviewed and updated.  Review of Systems  Constitutional: Negative for chills and fever.  Eyes: Negative for redness and visual disturbance.  Respiratory: Negative for chest tightness and shortness of breath.   Cardiovascular: Negative for chest pain and leg swelling.  Gastrointestinal: Positive  for abdominal distention. Negative for abdominal pain, blood in stool, constipation, diarrhea, nausea and vomiting.  Genitourinary: Negative for difficulty urinating.  Musculoskeletal: Negative for back pain and gait problem.  Skin: Negative for rash.  Neurological: Negative for dizziness, light-headedness and headaches.  Psychiatric/Behavioral: Negative for agitation and behavioral problems.  All other systems reviewed and are negative.   Per HPI unless specifically indicated above        Objective:    BP 132/89   Pulse 66   Temp 97.7 F (36.5 C) (Oral)   Ht 5' 4" (1.626 m)    Wt 180 lb (81.6 kg)   BMI 30.90 kg/m   Wt Readings from Last 3 Encounters:  08/04/16 180 lb (81.6 kg)  02/20/16 179 lb 3.2 oz (81.3 kg)  09/26/15 172 lb (78 kg)    Physical Exam  Constitutional: She is oriented to person, place, and time. She appears well-developed and well-nourished. No distress.  Eyes: Conjunctivae are normal.  Neck: Neck supple. No thyromegaly present.  Cardiovascular: Normal rate, regular rhythm, normal heart sounds and intact distal pulses.   No murmur heard. Pulmonary/Chest: Effort normal and breath sounds normal. No respiratory distress. She has no wheezes.  Musculoskeletal: Normal range of motion. She exhibits no edema or tenderness.  Lymphadenopathy:       Head (right side): No submental and no submandibular adenopathy present.       Head (left side): No submental and no submandibular adenopathy present.    She has no cervical adenopathy.    She has no axillary adenopathy.  Neurological: She is alert and oriented to person, place, and time. Coordination normal.  Skin: Skin is warm and dry. No rash noted. She is not diaphoretic.  Psychiatric: She has a normal mood and affect. Her behavior is normal.  Nursing note and vitals reviewed.       Assessment & Plan:   Problem List Items Addressed This Visit      Cardiovascular and Mediastinum   Essential hypertension, benign   Relevant Medications   lovastatin (MEVACOR) 20 MG tablet   losartan (COZAAR) 50 MG tablet   furosemide (LASIX) 40 MG tablet   Other Relevant Orders   CMP14+EGFR     Digestive   GERD (gastroesophageal reflux disease)   Relevant Medications   omeprazole (PRILOSEC) 40 MG capsule     Endocrine   Diabetes mellitus without complication (HCC) - Primary   Relevant Medications   metFORMIN (GLUCOPHAGE) 500 MG tablet   lovastatin (MEVACOR) 20 MG tablet   losartan (COZAAR) 50 MG tablet   glipiZIDE (GLUCOTROL) 10 MG tablet   Other Relevant Orders   CMP14+EGFR   Bayer DCA Hb A1c  Waived   Ambulatory referral to Ophthalmology     Other   Hyperlipidemia   Relevant Medications   lovastatin (MEVACOR) 20 MG tablet   losartan (COZAAR) 50 MG tablet   furosemide (LASIX) 40 MG tablet   Anxiety and depression    Other Visit Diagnoses    History of salivary gland cancer       Relevant Orders   Ambulatory referral to ENT       Follow up plan: Return in about 3 months (around 11/04/2016), or if symptoms worsen or fail to improve, for Diabetes and hypertension.  Counseling provided for all of the vaccine components Orders Placed This Encounter  Procedures  . Pneumococcal conjugate vaccine 13-valent IM  . CMP14+EGFR  . Bayer [facility] Hb A1c Waived    Caryl Pina, MD  [place] 08/04/2016, 8:17 AM

## 2016-08-05 LAB — BAYER DCA HB A1C WAIVED: HB A1C: 6.5 % (ref <7.0)

## 2016-08-05 LAB — CMP14+EGFR
A/G RATIO: 1.6 (ref 1.2–2.2)
ALK PHOS: 82 IU/L (ref 39–117)
ALT: 24 IU/L (ref 0–32)
AST: 22 IU/L (ref 0–40)
Albumin: 4.1 g/dL (ref 3.5–4.8)
BILIRUBIN TOTAL: 0.2 mg/dL (ref 0.0–1.2)
BUN/Creatinine Ratio: 28 (ref 12–28)
BUN: 23 mg/dL (ref 8–27)
CHLORIDE: 101 mmol/L (ref 96–106)
CO2: 26 mmol/L (ref 20–29)
Calcium: 9.3 mg/dL (ref 8.7–10.3)
Creatinine, Ser: 0.83 mg/dL (ref 0.57–1.00)
GFR calc Af Amer: 79 mL/min/{1.73_m2} (ref >59)
GFR calc non Af Amer: 68 mL/min/{1.73_m2} (ref >59)
GLUCOSE: 145 mg/dL — AB (ref 65–99)
Globulin, Total: 2.6 g/dL (ref 1.5–4.5)
POTASSIUM: 4.7 mmol/L (ref 3.5–5.2)
Sodium: 143 mmol/L (ref 134–144)
Total Protein: 6.7 g/dL (ref 6.0–8.5)

## 2016-08-11 DIAGNOSIS — K1123 Chronic sialoadenitis: Secondary | ICD-10-CM | POA: Diagnosis not present

## 2016-09-02 DIAGNOSIS — Z7984 Long term (current) use of oral hypoglycemic drugs: Secondary | ICD-10-CM | POA: Diagnosis not present

## 2016-09-02 DIAGNOSIS — E119 Type 2 diabetes mellitus without complications: Secondary | ICD-10-CM | POA: Diagnosis not present

## 2016-09-02 DIAGNOSIS — H40013 Open angle with borderline findings, low risk, bilateral: Secondary | ICD-10-CM | POA: Diagnosis not present

## 2016-09-02 DIAGNOSIS — H2513 Age-related nuclear cataract, bilateral: Secondary | ICD-10-CM | POA: Diagnosis not present

## 2016-09-02 LAB — HM DIABETES EYE EXAM

## 2016-09-24 ENCOUNTER — Ambulatory Visit (INDEPENDENT_AMBULATORY_CARE_PROVIDER_SITE_OTHER): Payer: Medicare HMO | Admitting: Family Medicine

## 2016-09-24 ENCOUNTER — Encounter: Payer: Self-pay | Admitting: Family Medicine

## 2016-09-24 VITALS — BP 132/67 | HR 68 | Temp 97.3°F | Ht 64.0 in | Wt 178.0 lb

## 2016-09-24 DIAGNOSIS — E785 Hyperlipidemia, unspecified: Secondary | ICD-10-CM

## 2016-09-24 DIAGNOSIS — E119 Type 2 diabetes mellitus without complications: Secondary | ICD-10-CM

## 2016-09-24 DIAGNOSIS — I10 Essential (primary) hypertension: Secondary | ICD-10-CM | POA: Diagnosis not present

## 2016-09-24 DIAGNOSIS — K219 Gastro-esophageal reflux disease without esophagitis: Secondary | ICD-10-CM

## 2016-09-24 DIAGNOSIS — Z23 Encounter for immunization: Secondary | ICD-10-CM

## 2016-09-24 DIAGNOSIS — E1159 Type 2 diabetes mellitus with other circulatory complications: Secondary | ICD-10-CM | POA: Diagnosis not present

## 2016-09-24 DIAGNOSIS — E1169 Type 2 diabetes mellitus with other specified complication: Secondary | ICD-10-CM

## 2016-09-24 LAB — BAYER DCA HB A1C WAIVED: HB A1C: 5.8 % (ref <7.0)

## 2016-09-24 NOTE — Progress Notes (Signed)
BP 132/67   Pulse 68   Temp (!) 97.3 F (36.3 C) (Oral)   Ht 5\' 4"  (1.626 m)   Wt 178 lb (80.7 kg)   BMI 30.55 kg/m    Subjective:    Patient ID: Linda Fletcher, female    DOB: 1940-03-30, 76 y.o.   MRN: [id number]  HPI: Linda Fletcher is a 76 y.o. female presenting on 09/24/2016 for Diabetes (followup; patient is fasting); Hyperlipidemia; and Hypertension   HPI Hyperlipidemia Patient is coming in for recheck of his hyperlipidemia. The patient is currently taking Lovastatin. They deny any issues with myalgias or history of liver damage from it. They deny any focal numbness or weakness or chest pain.   Type 2 diabetes mellitus Patient comes in today for recheck of his diabetes. Patient has been currently taking glipizide and metformin. Patient is currently on an ACE inhibitor/ARB. Patient has not seen an ophthalmologist this year. Patient denies any issues with their feet.   Hypertension Patient is currently on losartan, and their blood pressure today is 132/67. Patient denies any lightheadedness or dizziness. Patient denies headaches, blurred vision, chest pains, shortness of breath, or weakness. Denies any side effects from medication and is content with current medication.   GERD Patient is currently on omeprazole.  She denies any major symptoms or abdominal pain or belching or burping. She denies any blood in her stool or lightheadedness or dizziness.   Relevant past medical, surgical, family and social history reviewed and updated as indicated. Interim medical history since our last visit reviewed. Allergies and medications reviewed and updated.  Review of Systems  Constitutional: Negative for chills and fever.  HENT: Negative for congestion, ear discharge and ear pain.   Eyes: Negative for redness and visual disturbance.  Respiratory: Negative for chest tightness and shortness of breath.   Cardiovascular: Negative for chest pain and leg swelling.  Gastrointestinal: Negative  for abdominal pain, constipation, diarrhea, nausea and vomiting.  Genitourinary: Negative for difficulty urinating and dysuria.  Musculoskeletal: Negative for back pain and gait problem.  Skin: Negative for rash.  Neurological: Negative for light-headedness and headaches.  Psychiatric/Behavioral: Negative for agitation and behavioral problems.  All other systems reviewed and are negative.   Per HPI unless specifically indicated above     Objective:    BP 132/67   Pulse 68   Temp (!) 97.3 F (36.3 C) (Oral)   Ht 5\' 4"  (1.626 m)   Wt 178 lb (80.7 kg)   BMI 30.55 kg/m   Wt Readings from Last 3 Encounters:  09/24/16 178 lb (80.7 kg)  08/04/16 180 lb (81.6 kg)  02/20/16 179 lb 3.2 oz (81.3 kg)    Physical Exam  Constitutional: She is oriented to person, place, and time. She appears well-developed and well-nourished. No distress.  Eyes: Conjunctivae are normal.  Neck: Neck supple. No thyromegaly present.  Cardiovascular: Normal rate, regular rhythm, normal heart sounds and intact distal pulses.   No murmur heard. Pulmonary/Chest: Effort normal and breath sounds normal. No respiratory distress. She has no wheezes. She has no rales.  Musculoskeletal: Normal range of motion. She exhibits no edema or tenderness.  Lymphadenopathy:    She has no cervical adenopathy.  Neurological: She is alert and oriented to person, place, and time. Coordination normal.  Skin: Skin is warm and dry. No rash noted. She is not diaphoretic.  Psychiatric: She has a normal mood and affect. Her behavior is normal.  Nursing note and vitals reviewed.   Results for  orders placed or performed in visit on 09/17/16  HM DIABETES EYE EXAM  Result Value Ref Range   HM Diabetic Eye Exam No Retinopathy No Retinopathy      Assessment & Plan:   Problem List Items Addressed This Visit      Cardiovascular and Mediastinum   Hypertension associated with diabetes ([place])     Digestive   GERD (gastroesophageal  reflux disease)     Endocrine   Diabetes mellitus without complication ([place])   Relevant Orders   Bayer DCA Hb A1c Waived   Hyperlipidemia associated with type 2 diabetes mellitus ([place]) - Primary      Continue current medications, we'll check A1c  Follow up plan: Return in about 6 months (around 03/24/2017), or if symptoms worsen or fail to improve, for Diabetes.  Counseling provided for all of the vaccine components Orders Placed This Encounter  Procedures  . Bayer [facility] Hb A1c Munjor, MD [facility] 09/24/2016, 8:42 AM

## 2016-09-29 ENCOUNTER — Ambulatory Visit: Payer: Medicare HMO | Admitting: *Deleted

## 2016-10-05 ENCOUNTER — Other Ambulatory Visit: Payer: Self-pay | Admitting: Family Medicine

## 2016-10-07 ENCOUNTER — Encounter: Payer: Self-pay | Admitting: *Deleted

## 2016-10-07 ENCOUNTER — Ambulatory Visit (INDEPENDENT_AMBULATORY_CARE_PROVIDER_SITE_OTHER): Payer: Medicare HMO | Admitting: *Deleted

## 2016-10-07 DIAGNOSIS — Z23 Encounter for immunization: Secondary | ICD-10-CM | POA: Diagnosis not present

## 2016-10-07 DIAGNOSIS — Z0000 Encounter for general adult medical examination without abnormal findings: Secondary | ICD-10-CM

## 2016-10-07 NOTE — Patient Instructions (Addendum)
  Ms. Trovato , Thank you for taking time to come for your Medicare Wellness Visit. I appreciate your ongoing commitment to your health goals. Please review the following plan we discussed and let me know if I can assist you in the future.   These are the goals we discussed:  Continue the great work with going to your workouts with [employer] at the [place] working on watching sugar intake and taking your medications appropriately to maintain your hemoglobin A1c in a good range- your last one was 5.8% and this is good!  Review the information given about advanced directives, and if you decide to put these in please please bring our office a copy for your chart.    You received your flu vaccine today.    Thank you,  Nolberto Hanlon, RN

## 2016-10-07 NOTE — Progress Notes (Signed)
Subjective:   Linda Fletcher is a 76 y.o. female who presents for an Initial Medicare Annual Wellness Visit.  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  She enjoys watching television and doing word finds.  She exercises 3 days per week with the [employer] group at the [facility].  She lives with her son, daughter in law, and their 4 indoor dogs.  Linda Fletcher has a daughter who lives in [place], [place] and one daughter who is deceased.    She states she was admitted to [facility] 08/2015 for a suicide attempt.  She denies any depression symptoms or suicidal ideation today.   She has had no surgeries during the past year, and feels her health is about the same as it was last year.    Review of Systems    All systems negative today        Objective:    Today's Vitals   10/07/16 0935  BP: 129/72  Pulse: 70  Weight: 173 lb (78.5 kg)  Height: 5' 4" (1.626 m)  PainSc: 0-No pain   Body mass index is 29.7 kg/m.   Current Medications (verified) Outpatient Encounter Prescriptions as of 10/07/2016  Medication Sig  . ACCU-CHEK AVIVA PLUS test strip CHECK BLOOD SUGAR TWICE DAILY  . ACCU-CHEK SOFTCLIX LANCETS lancets TEST BLOOD SUGAR TWICE DAILY  . Blood Glucose Monitoring Suppl (ACCU-CHEK AVIVA PLUS) W/DEVICE KIT Use to check sugar bid. DX E11.9  . calcium carbonate (OS-CAL) 600 MG TABS tablet Take 600 mg by mouth 2 (two) times daily with a meal.  . citalopram (CELEXA) 40 MG tablet Take 1 tablet (40 mg total) by mouth daily.  Marland Kitchen donepezil (ARICEPT) 10 MG tablet Take 1 tablet (10 mg total) by mouth at bedtime.  . furosemide (LASIX) 40 MG tablet Take 1 tablet (40 mg total) by mouth daily.  Marland Kitchen glipiZIDE (GLUCOTROL) 10 MG tablet Take 1 tablet (10 mg total) by mouth 2 (two) times daily.  Marland Kitchen losartan (COZAAR) 50 MG tablet Take 1 tablet (50 mg total) by mouth daily.  Marland Kitchen lovastatin (MEVACOR) 20 MG tablet Take 1 tablet (20 mg total) by mouth daily.  . Melatonin 10 MG CAPS Take 10 mg by mouth at  bedtime.  . metFORMIN (GLUCOPHAGE) 500 MG tablet Take 1 in the morning and 2 in the evening  . omeprazole (PRILOSEC) 40 MG capsule Take 1 capsule (40 mg total) by mouth daily.  . Probiotic Product (PROBIOTIC DAILY PO) Take by mouth.   No facility-administered encounter medications on file as of 10/07/2016.     Allergies (verified) Patient has no known allergies.   History: Past Medical History:  Diagnosis Date  . Arthritis   . Dementia   . Diabetes mellitus without complication ([place])   . GERD (gastroesophageal reflux disease)   . Hyperlipidemia   . Hypertension    Past Surgical History:  Procedure Laterality Date  . ABDOMINAL HYSTERECTOMY    . CHOLECYSTECTOMY    . THYROID CYST EXCISION    . TONSILLECTOMY AND ADENOIDECTOMY     No family history on file. Social History   Occupational History  . Not on file.   Social History Main Topics  . Smoking status: Former Smoker    Quit date: 07/18/1974  . Smokeless tobacco: Never Used  . Alcohol use No  . Drug use: No  . Sexual activity: Not Currently    Tobacco Counseling Counseling given: No   Activities of Daily Living In your present state of health,  do you have any difficulty performing the following activities: 10/07/2016  Hearing? N  Vision? Y  Comment Some blurred vision even when wearing glasses, was seen by Dr. Rona Ravens at [facility] on 09/02/16  Difficulty concentrating or making decisions? Y  Comment Lives with son and he helps with decision making  Walking or climbing stairs? N  Dressing or bathing? N  Doing errands, shopping? Y  Comment Son and daughter in law provide transportation, patient no longer drives  Some recent data might be hidden    Immunizations and Health Maintenance Immunization History  Administered Date(s) Administered  . Influenza, High Dose Seasonal PF 12/13/2015, 10/07/2016  . Influenza,inj,Quad PF,6+ Mos 01/09/2015  . Pneumococcal Conjugate-13 09/24/2016   Health Maintenance Due    Topic Date Due  . INFLUENZA VACCINE  08/12/2016  . FOOT EXAM  09/25/2016      Patient Care Team: Dettinger, Fransisca Kaufmann, MD as PCP - General (Family Medicine)  Indicate any recent Medical Services you may have received from other than [facility] providers in the past year (date may be approximate).     Assessment:   This is a routine wellness examination for [name].   Hearing/Vision screen Was seen at [facility] 09/02/16 for eye exam No hearing deficits noted  Dietary issues and exercise activities discussed: Current Exercise Habits: Structured exercise class ([employer]), Type of exercise: calisthenics;strength training/weights, Time (Minutes): 60, Frequency (Times/Week): 3, Weekly Exercise (Minutes/Week): 180, Intensity: Moderate  Goals    . HEMOGLOBIN A1C < 7.0          Continue watching carbohydrate intake, taking medications appropriately, and exercise regimen.      Recommended diet consisting mostly of lean proteins, vegetables, and whole grains and fruits in moderation   Depression Screen PHQ 2/9 Scores 10/07/2016 09/24/2016 08/04/2016 02/20/2016 09/26/2015 06/25/2015 06/07/2015  PHQ - 2 Score 0 0 0 0 _0 PHQ- 9 Score - - - - 12 13 -    Fall Risk Fall Risk  10/07/2016 09/24/2016 08/04/2016 02/20/2016 06/25/2015  Falls in the past year? No Yes Yes Yes Yes  Number falls in past yr: - _1 or more 2 or more  Injury with Fall? - No No Yes No  Risk for fall due to : - - - - -  Follow up - - - - -    Cognitive Function: MMSE - Mini Mental State Exam 10/07/2016  Orientation to time 3  Orientation to Place 4  Registration 3  Attention/ Calculation 0  Recall 1  Language- name 2 objects 2  Language- repeat 1  Language- follow 3 step command 3  Language- read & follow direction 1  Write a sentence 1  Copy design 1  Total score 20        Screening Tests Health Maintenance  Topic Date Due  . INFLUENZA VACCINE  08/12/2016  . FOOT EXAM  09/25/2016  . TETANUS/TDAP   02/04/2017 (Originally 07/17/1959)  . HEMOGLOBIN A1C  03/24/2017  . OPHTHALMOLOGY EXAM  09/02/2017  . DEXA SCAN  Addressed  . PNA vac Low Risk Adult  Completed   High dose Influenza vaccine given today    Plan:      Continue the great work with going to your workouts with [employer] at the [place] working on watching sugar intake and taking your medications appropriately to maintain your hemoglobin A1c in a good range- your last one was 5.8% and this is good!  Review the information given about advanced directives, and if you decide to put these in please please bring our office a copy for your chart.      I have personally reviewed and noted the following in the patient's chart:   . Medical and social history . Use of alcohol, tobacco or illicit drugs  . Current medications and supplements . Functional ability and status . Nutritional status . Physical activity . Advanced directives . List of other physicians . Hospitalizations, surgeries, and ER visits in previous 12 months . Vitals . Screenings to include cognitive, depression, and falls . Referrals and appointments  In addition, I have reviewed and discussed with patient certain preventive protocols, quality metrics, and best practice recommendations. A written personalized care plan for preventive services as well as general preventive health recommendations were provided to patient.     ,  M, RN   10/07/2016    I have reviewed and agree with the above AWV documentation.   Caryl Pina, MD [facility] 10/08/2016, 7:52 AM

## 2016-12-17 ENCOUNTER — Ambulatory Visit: Payer: Medicare HMO | Admitting: Family Medicine

## 2017-03-02 ENCOUNTER — Other Ambulatory Visit: Payer: Self-pay | Admitting: Family Medicine

## 2017-03-02 DIAGNOSIS — E119 Type 2 diabetes mellitus without complications: Secondary | ICD-10-CM

## 2017-03-24 ENCOUNTER — Ambulatory Visit (INDEPENDENT_AMBULATORY_CARE_PROVIDER_SITE_OTHER): Payer: Medicare HMO | Admitting: Family Medicine

## 2017-03-24 ENCOUNTER — Encounter: Payer: Self-pay | Admitting: Family Medicine

## 2017-03-24 VITALS — BP 133/70 | HR 62 | Temp 97.2°F | Ht 64.0 in | Wt 179.8 lb

## 2017-03-24 DIAGNOSIS — E1159 Type 2 diabetes mellitus with other circulatory complications: Secondary | ICD-10-CM | POA: Diagnosis not present

## 2017-03-24 DIAGNOSIS — E785 Hyperlipidemia, unspecified: Secondary | ICD-10-CM

## 2017-03-24 DIAGNOSIS — F339 Major depressive disorder, recurrent, unspecified: Secondary | ICD-10-CM | POA: Diagnosis not present

## 2017-03-24 DIAGNOSIS — E1169 Type 2 diabetes mellitus with other specified complication: Secondary | ICD-10-CM

## 2017-03-24 DIAGNOSIS — I10 Essential (primary) hypertension: Secondary | ICD-10-CM

## 2017-03-24 DIAGNOSIS — E119 Type 2 diabetes mellitus without complications: Secondary | ICD-10-CM

## 2017-03-24 DIAGNOSIS — K219 Gastro-esophageal reflux disease without esophagitis: Secondary | ICD-10-CM | POA: Diagnosis not present

## 2017-03-24 DIAGNOSIS — I152 Hypertension secondary to endocrine disorders: Secondary | ICD-10-CM

## 2017-03-24 LAB — BAYER DCA HB A1C WAIVED: HB A1C: 6.2 % (ref <7.0)

## 2017-03-24 MED ORDER — TRAZODONE HCL 50 MG PO TABS: 25.0000 mg | ORAL_TABLET | Freq: Every evening | ORAL | 3 refills | Status: DC | PRN

## 2017-03-24 NOTE — Progress Notes (Signed)
BP 133/70   Pulse 62   Temp (!) 97.2 F (36.2 C) (Oral)   Ht 5' 4" (1.626 m)   Wt 179 lb 12.8 oz (81.6 kg)   BMI 30.86 kg/m    Subjective:    Patient ID: Linda Fletcher, female    DOB: September 08, 1940, 77 y.o.   MRN: [id number]  HPI: Linda Fletcher is a 77 y.o. female presenting on 03/24/2017 for Diabetes (6 month follow up) and Hypertension   HPI Type 2 diabetes mellitus Patient comes in today for recheck of his diabetes. Patient has been currently taking metformin. Patient is currently on an ACE inhibitor/ARB. Patient has not seen an ophthalmologist this year. Patient denies any issues with their feet.   Hypertension Patient is currently on losartan, and their blood pressure today is 133/70. Patient denies any lightheadedness or dizziness. Patient denies headaches, blurred vision, chest pains, shortness of breath, or weakness. Denies any side effects from medication and is content with current medication.   Hyperlipidemia Patient is coming in for recheck of his hyperlipidemia. The patient is currently taking lovastatin. They deny any issues with myalgias or history of liver damage from it. They deny any focal numbness or weakness or chest pain.   GERD Patient is currently on omeprazole.  She denies any major symptoms or abdominal pain or belching or burping. She denies any blood in her stool or lightheadedness or dizziness.   Depression recheck Patient has been having some issues with depression and anxiety and sleep.  She does not want anything to help more with depression but would like something to help more with sleep.  They feel like the melatonin is just not cutting it.  She says she often has feelings that she be better off not being here a dead but says has not changed and she is not and act on anything.  She denies any suicidal ideations.  She says her mood is about what it always been throughout her life and that she is just always anxious. Depression screen [facility] 2/9 03/24/2017  10/07/2016 09/24/2016 08/04/2016 02/20/2016  Decreased Interest 1 0 0 0 0  Down, Depressed, Hopeless 1 0 0 0 0  PHQ - 2 Score 2 0 0 0 0  Altered sleeping 3 - - - -  Tired, decreased energy 2 - - - -  Change in appetite 0 - - - -  Feeling bad or failure about yourself  1 - - - -  Trouble concentrating 0 - - - -  Moving slowly or fidgety/restless 0 - - - -  Suicidal thoughts 1 - - - -  PHQ-9 Score 9 - - - -  Difficult doing work/chores - - - - -     Relevant past medical, surgical, family and social history reviewed and updated as indicated. Interim medical history since our last visit reviewed. Allergies and medications reviewed and updated.  Review of Systems  Constitutional: Negative for chills and fever.  HENT: Negative for congestion, ear discharge and ear pain.   Eyes: Negative for visual disturbance.  Respiratory: Negative for chest tightness and shortness of breath.   Cardiovascular: Negative for chest pain and leg swelling.  Genitourinary: Negative for dysuria.  Musculoskeletal: Negative for back pain and gait problem.  Skin: Negative for rash.  Neurological: Negative for light-headedness and headaches.  Psychiatric/Behavioral: Positive for dysphoric mood and sleep disturbance. Negative for agitation, behavioral problems, self-injury and suicidal ideas. The patient is nervous/anxious.   All other systems reviewed and are  negative.   Per HPI unless specifically indicated above   Allergies as of 03/24/2017   No Known Allergies     Medication List        Accurate as of 03/24/17  8:38 AM. Always use your most recent med list.          ACCU-CHEK AVIVA PLUS test strip Generic drug:  glucose blood CHECK BLOOD SUGAR TWICE DAILY   ACCU-CHEK AVIVA PLUS w/Device Kit Use to check sugar bid. DX E11.9   ACCU-CHEK SOFTCLIX LANCETS lancets TEST BLOOD SUGAR TWICE DAILY   calcium carbonate 600 MG Tabs tablet Commonly known as:  OS-CAL Take 600 mg by mouth 2 (two) times daily  with a meal.   citalopram 40 MG tablet Commonly known as:  CELEXA TAKE 1 TABLET EVERY DAY   donepezil 10 MG tablet Commonly known as:  ARICEPT Take 1 tablet (10 mg total) by mouth at bedtime.   furosemide 40 MG tablet Commonly known as:  LASIX TAKE 1 TABLET EVERY DAY   glipiZIDE 10 MG tablet Commonly known as:  GLUCOTROL TAKE 1 TABLET TWICE DAILY   losartan 50 MG tablet Commonly known as:  COZAAR Take 1 tablet (50 mg total) by mouth daily.   lovastatin 20 MG tablet Commonly known as:  MEVACOR TAKE 1 TABLET (20 MG TOTAL) BY MOUTH DAILY.   Melatonin 10 MG Caps Take 10 mg by mouth at bedtime.   metFORMIN 500 MG tablet Commonly known as:  GLUCOPHAGE TAKE 1 TABLET EVERY MORNING  AND TAKE 2 TABLETS EVERY EVENING   omeprazole 40 MG capsule Commonly known as:  PRILOSEC Take 1 capsule (40 mg total) by mouth daily.   PROBIOTIC DAILY PO Take by mouth.          Objective:    BP 133/70   Pulse 62   Temp (!) 97.2 F (36.2 C) (Oral)   Ht 5' 4" (1.626 m)   Wt 179 lb 12.8 oz (81.6 kg)   BMI 30.86 kg/m   Wt Readings from Last 3 Encounters:  03/24/17 179 lb 12.8 oz (81.6 kg)  10/07/16 173 lb (78.5 kg)  09/24/16 178 lb (80.7 kg)    Physical Exam  Constitutional: She is oriented to person, place, and time. She appears well-developed and well-nourished. No distress.  Eyes: Conjunctivae are normal.  Neck: Neck supple. No thyromegaly present.  Cardiovascular: Normal rate, regular rhythm, normal heart sounds and intact distal pulses.  No murmur heard. Pulmonary/Chest: Effort normal and breath sounds normal. No respiratory distress. She has no wheezes. She has no rales.  Musculoskeletal: Normal range of motion. She exhibits no edema or tenderness.  Lymphadenopathy:    She has no cervical adenopathy.  Neurological: She is alert and oriented to person, place, and time. Coordination normal.  Skin: Skin is warm and dry. No rash noted. She is not diaphoretic.  Psychiatric: Her  behavior is normal. Judgment normal. Her mood appears anxious. She exhibits a depressed mood. She expresses no suicidal ideation. She expresses no suicidal plans.  Nursing note and vitals reviewed.   Results for orders placed or performed in visit on 09/24/16  Bayer DCA Hb A1c Waived  Result Value Ref Range   Bayer DCA Hb A1c Waived 5.8 <7.0 %      Assessment & Plan:   Problem List Items Addressed This Visit      Cardiovascular and Mediastinum   Hypertension associated with diabetes ([place]) - Primary   Relevant Orders   CMP14+EGFR  Digestive   GERD (gastroesophageal reflux disease)   Relevant Orders   CBC with Differential/Platelet     Endocrine   Diabetes mellitus without complication ([place])   Relevant Orders   Bayer DCA Hb A1c Waived   Hyperlipidemia associated with type 2 diabetes mellitus ([place])   Relevant Orders   Lipid panel     Other   Depression, recurrent ([place])   Relevant Medications   traZODone (DESYREL) 50 MG tablet   Other Relevant Orders   CBC with Differential/Platelet       Follow up plan: Return in about 6 months (around 09/24/2017), or if symptoms worsen or fail to improve, for Recheck diabetes.  Counseling provided for all of the vaccine components Orders Placed This Encounter  Procedures  . Bayer DCA Hb A1c Waived  . CMP14+EGFR  . Lipid panel  . CBC with Differential/Platelet    Caryl Pina, MD [facility] 03/24/2017, 8:38 AM

## 2017-03-25 LAB — CBC WITH DIFFERENTIAL/PLATELET
BASOS: 1 %
Basophils Absolute: 0 10*3/uL (ref 0.0–0.2)
EOS (ABSOLUTE): 0.1 10*3/uL (ref 0.0–0.4)
Eos: 2 %
Hematocrit: 32.6 % — ABNORMAL LOW (ref 34.0–46.6)
Hemoglobin: 10.3 g/dL — ABNORMAL LOW (ref 11.1–15.9)
Immature Grans (Abs): 0 10*3/uL (ref 0.0–0.1)
Immature Granulocytes: 0 %
Lymphocytes Absolute: 1.5 10*3/uL (ref 0.7–3.1)
Lymphs: 23 %
MCH: 24.8 pg — AB (ref 26.6–33.0)
MCHC: 31.6 g/dL (ref 31.5–35.7)
MCV: 78 fL — AB (ref 79–97)
MONOS ABS: 0.5 10*3/uL (ref 0.1–0.9)
Monocytes: 8 %
NEUTROS ABS: 4.3 10*3/uL (ref 1.4–7.0)
Neutrophils: 66 %
PLATELETS: 173 10*3/uL (ref 150–379)
RBC: 4.16 x10E6/uL (ref 3.77–5.28)
RDW: 16.5 % — ABNORMAL HIGH (ref 12.3–15.4)
WBC: 6.5 10*3/uL (ref 3.4–10.8)

## 2017-03-25 LAB — CMP14+EGFR
ALT: 17 IU/L (ref 0–32)
AST: 20 IU/L (ref 0–40)
Albumin/Globulin Ratio: 1.7 (ref 1.2–2.2)
Albumin: 4 g/dL (ref 3.5–4.8)
Alkaline Phosphatase: 70 IU/L (ref 39–117)
BUN/Creatinine Ratio: 21 (ref 12–28)
BUN: 15 mg/dL (ref 8–27)
Bilirubin Total: 0.3 mg/dL (ref 0.0–1.2)
CO2: 27 mmol/L (ref 20–29)
CREATININE: 0.7 mg/dL (ref 0.57–1.00)
Calcium: 9.6 mg/dL (ref 8.7–10.3)
Chloride: 101 mmol/L (ref 96–106)
GFR calc Af Amer: 97 mL/min/{1.73_m2} (ref >59)
GFR, EST NON AFRICAN AMERICAN: 84 mL/min/{1.73_m2} (ref >59)
Globulin, Total: 2.3 g/dL (ref 1.5–4.5)
Glucose: 102 mg/dL — ABNORMAL HIGH (ref 65–99)
Potassium: 4.1 mmol/L (ref 3.5–5.2)
Sodium: 143 mmol/L (ref 134–144)
Total Protein: 6.3 g/dL (ref 6.0–8.5)

## 2017-03-25 LAB — LIPID PANEL
CHOL/HDL RATIO: 3.9 ratio (ref 0.0–4.4)
Cholesterol, Total: 162 mg/dL (ref 100–199)
HDL: 42 mg/dL (ref >39)
LDL CALC: 82 mg/dL (ref 0–99)
TRIGLYCERIDES: 190 mg/dL — AB (ref 0–149)
VLDL Cholesterol Cal: 38 mg/dL (ref 5–40)

## 2017-04-23 ENCOUNTER — Other Ambulatory Visit: Payer: Self-pay | Admitting: Family Medicine

## 2017-04-23 DIAGNOSIS — K219 Gastro-esophageal reflux disease without esophagitis: Secondary | ICD-10-CM

## 2017-04-29 ENCOUNTER — Encounter: Payer: Self-pay | Admitting: Family Medicine

## 2017-04-29 ENCOUNTER — Ambulatory Visit (INDEPENDENT_AMBULATORY_CARE_PROVIDER_SITE_OTHER): Payer: Medicare HMO | Admitting: Family Medicine

## 2017-04-29 ENCOUNTER — Ambulatory Visit (INDEPENDENT_AMBULATORY_CARE_PROVIDER_SITE_OTHER): Payer: Medicare HMO

## 2017-04-29 VITALS — BP 139/78 | HR 77 | Temp 97.5°F | Ht 64.0 in | Wt 178.0 lb

## 2017-04-29 DIAGNOSIS — S2242XA Multiple fractures of ribs, left side, initial encounter for closed fracture: Secondary | ICD-10-CM | POA: Diagnosis not present

## 2017-04-29 DIAGNOSIS — R0781 Pleurodynia: Secondary | ICD-10-CM

## 2017-04-29 DIAGNOSIS — S2232XA Fracture of one rib, left side, initial encounter for closed fracture: Secondary | ICD-10-CM | POA: Diagnosis not present

## 2017-04-29 NOTE — Progress Notes (Signed)
BP 139/78   Pulse 77   Temp (!) 97.5 F (36.4 C) (Oral)   Ht '5\' 4"'$  (1.626 m)   Wt 178 lb (80.7 kg)   BMI 30.55 kg/m    Subjective:    Patient ID: Linda Fletcher, female    DOB: 1940-01-18, 77 y.o.   MRN: [id number]  HPI: Linda Fletcher is a 77 y.o. female presenting on 04/29/2017 for Pain and bruising in left side/rib area (fell and hit night stand) and Dizziness   HPI Fall and left rib pain Patient comes in today because she had a fall 2 days ago at her home where she felt off balance and dizzy and fell and hit the left side of her chest against a nightstand.  She said she tried to sit down on her bed when she felt a little dizzy but then still ended up going down and hitting the left side of her chest against the nightstand.  She is been having significant left-sided chest pain along with pain with deep inspiration and wanted to come in today for an x-ray.  She says the pain has been improving and she has been using heat packs and Tylenol and ibuprofen.  She denies any cough or wheezing  Relevant past medical, surgical, family and social history reviewed and updated as indicated. Interim medical history since our last visit reviewed. Allergies and medications reviewed and updated.  Review of Systems  Constitutional: Negative for chills and fever.  Respiratory: Negative for chest tightness and shortness of breath.   Cardiovascular: Positive for chest pain (Left-sided chest wall pain and bruising). Negative for leg swelling.  Gastrointestinal: Negative for abdominal pain.  Musculoskeletal: Negative for back pain, gait problem and neck pain.  Skin: Positive for color change. Negative for rash.  Neurological: Positive for dizziness and headaches (Patient says she still has a slight headache and also has a little bit of bruising on the left occiput where she also hit her head after she hit her side.).  Psychiatric/Behavioral: Negative for agitation and behavioral problems.  All other  systems reviewed and are negative.   Per HPI unless specifically indicated above   Allergies as of 04/29/2017   No Known Allergies     Medication List        Accurate as of 04/29/17  4:31 PM. Always use your most recent med list.          ACCU-CHEK AVIVA PLUS test strip Generic drug:  glucose blood CHECK BLOOD SUGAR TWICE DAILY   ACCU-CHEK AVIVA PLUS w/Device Kit Use to check sugar bid. DX E11.9   ACCU-CHEK SOFTCLIX LANCETS lancets TEST BLOOD SUGAR TWICE DAILY   calcium carbonate 600 MG Tabs tablet Commonly known as:  OS-CAL Take 600 mg by mouth 2 (two) times daily with a meal.   citalopram 40 MG tablet Commonly known as:  CELEXA TAKE 1 TABLET EVERY DAY   donepezil 10 MG tablet Commonly known as:  ARICEPT TAKE 1 TABLET (10 MG TOTAL) BY MOUTH AT BEDTIME.   furosemide 40 MG tablet Commonly known as:  LASIX TAKE 1 TABLET EVERY DAY   glipiZIDE 10 MG tablet Commonly known as:  GLUCOTROL TAKE 1 TABLET TWICE DAILY   losartan 50 MG tablet Commonly known as:  COZAAR TAKE 1 TABLET (50 MG TOTAL) BY MOUTH DAILY.   lovastatin 20 MG tablet Commonly known as:  MEVACOR TAKE 1 TABLET (20 MG TOTAL) BY MOUTH DAILY.   Melatonin 10 MG Caps Take 10 mg by mouth  at bedtime.   metFORMIN 500 MG tablet Commonly known as:  GLUCOPHAGE TAKE 1 TABLET EVERY MORNING  AND TAKE 2 TABLETS EVERY EVENING   omeprazole 40 MG capsule Commonly known as:  PRILOSEC TAKE 1 CAPSULE (40 MG TOTAL) BY MOUTH DAILY.   PROBIOTIC DAILY PO Take by mouth.   traZODone 50 MG tablet Commonly known as:  DESYREL Take 0.5-1 tablets (25-50 mg total) by mouth at bedtime as needed for sleep.          Objective:    BP 139/78   Pulse 77   Temp (!) 97.5 F (36.4 C) (Oral)   Ht '5\' 4"'$  (1.626 m)   Wt 178 lb (80.7 kg)   BMI 30.55 kg/m   Wt Readings from Last 3 Encounters:  04/29/17 178 lb (80.7 kg)  03/24/17 179 lb 12.8 oz (81.6 kg)  10/07/16 173 lb (78.5 kg)    Physical Exam  Constitutional:  She is oriented to person, place, and time. She appears well-developed and well-nourished. No distress.  Eyes: Pupils are equal, round, and reactive to light. Conjunctivae and EOM are normal. No scleral icterus.  Cardiovascular: Normal rate, regular rhythm, normal heart sounds and intact distal pulses.  No murmur heard. Pulmonary/Chest: Effort normal and breath sounds normal. No respiratory distress. She has no wheezes. She exhibits tenderness.    Musculoskeletal: Normal range of motion. She exhibits no edema.  Slight bruising over left occiput, no pain upon palpation  Neurological: She is alert and oriented to person, place, and time. She displays normal reflexes. No cranial nerve deficit. She exhibits normal muscle tone. Coordination normal.  Skin: Skin is warm and dry. No rash noted. She is not diaphoretic.  Psychiatric: She has a normal mood and affect. Her behavior is normal.  Nursing note and vitals reviewed.   Left rib x-ray: At least one rib fracture visible and possible hints of 3 rib fractures.  Await final read from radiology    Assessment & Plan:   Problem List Items Addressed This Visit    None    Visit Diagnoses    Closed fracture of multiple ribs of left side, initial encounter    -  Primary   Relevant Orders   DG Ribs Unilateral W/Chest Left (Completed)       Follow up plan: Return if symptoms worsen or fail to improve.  Counseling provided for all of the vaccine components Orders Placed This Encounter  Procedures  . DG Ribs Unilateral W/Chest Left    Caryl Pina, MD [facility] 04/29/2017, 4:31 PM

## 2017-06-11 ENCOUNTER — Other Ambulatory Visit: Payer: Self-pay | Admitting: Family Medicine

## 2017-06-11 DIAGNOSIS — Z780 Asymptomatic menopausal state: Secondary | ICD-10-CM

## 2017-06-23 ENCOUNTER — Other Ambulatory Visit: Payer: Medicare HMO

## 2017-06-23 ENCOUNTER — Other Ambulatory Visit: Payer: Self-pay

## 2017-06-23 DIAGNOSIS — Z13820 Encounter for screening for osteoporosis: Secondary | ICD-10-CM

## 2017-08-09 ENCOUNTER — Ambulatory Visit (INDEPENDENT_AMBULATORY_CARE_PROVIDER_SITE_OTHER): Payer: Medicare HMO

## 2017-08-09 DIAGNOSIS — M85851 Other specified disorders of bone density and structure, right thigh: Secondary | ICD-10-CM | POA: Diagnosis not present

## 2017-08-09 DIAGNOSIS — Z780 Asymptomatic menopausal state: Secondary | ICD-10-CM

## 2017-09-14 ENCOUNTER — Other Ambulatory Visit: Payer: Self-pay | Admitting: Family Medicine

## 2017-09-14 DIAGNOSIS — E119 Type 2 diabetes mellitus without complications: Secondary | ICD-10-CM

## 2017-09-16 NOTE — Telephone Encounter (Signed)
Last seen 04/29/17  Dr Dettinger

## 2017-10-17 ENCOUNTER — Other Ambulatory Visit: Payer: Self-pay | Admitting: Family Medicine

## 2017-10-17 DIAGNOSIS — E119 Type 2 diabetes mellitus without complications: Secondary | ICD-10-CM

## 2017-10-27 ENCOUNTER — Other Ambulatory Visit: Payer: Self-pay | Admitting: Family Medicine

## 2017-10-28 NOTE — Telephone Encounter (Signed)
Last seen 04/29/17.

## 2017-10-29 ENCOUNTER — Encounter: Payer: Medicare HMO | Admitting: *Deleted

## 2017-11-11 DIAGNOSIS — Z7984 Long term (current) use of oral hypoglycemic drugs: Secondary | ICD-10-CM | POA: Diagnosis not present

## 2017-11-11 DIAGNOSIS — H40013 Open angle with borderline findings, low risk, bilateral: Secondary | ICD-10-CM | POA: Diagnosis not present

## 2017-11-11 DIAGNOSIS — E119 Type 2 diabetes mellitus without complications: Secondary | ICD-10-CM | POA: Diagnosis not present

## 2017-11-11 DIAGNOSIS — H2513 Age-related nuclear cataract, bilateral: Secondary | ICD-10-CM | POA: Diagnosis not present

## 2017-11-11 LAB — HM DIABETES EYE EXAM

## 2017-12-14 ENCOUNTER — Encounter: Payer: Self-pay | Admitting: *Deleted

## 2017-12-14 ENCOUNTER — Ambulatory Visit (INDEPENDENT_AMBULATORY_CARE_PROVIDER_SITE_OTHER): Payer: Medicare HMO | Admitting: *Deleted

## 2017-12-14 VITALS — BP 128/82 | HR 79 | Ht 64.0 in | Wt 184.0 lb

## 2017-12-14 DIAGNOSIS — Z23 Encounter for immunization: Secondary | ICD-10-CM | POA: Diagnosis not present

## 2017-12-14 DIAGNOSIS — Z0000 Encounter for general adult medical examination without abnormal findings: Secondary | ICD-10-CM | POA: Diagnosis not present

## 2017-12-14 NOTE — Progress Notes (Addendum)
Subjective:   Zamiyah Resendes is a 77 y.o. female who presents for Medicare Annual (Subsequent) preventive examination.  Ms. Newsom is accompanied today by her son Iona Beard.  She lives with Iona Beard and his wife.  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  She lived in [place] until moving to [place] about 5 years ago.  Ms. Win enjoys doing word searches, exercising at [employer], and watching television.  She had 3 children, 1 of her daughters is deceased, and 2 grandchildren.  She has 3 indoor dogs.  Ms. Bennetts feels her health is unchanged from last year.  She reports not ER visits, hospitalizations, or surgeries in the past year.   Patient states she has "places" on her lower back and buttocks she wants me to look at.  There are 4-5 scabbed over lesions in the area.  Her son is concerned she has bed sores.  Areas do not look infected, advised Ms. Stenglein to make sure to show them to Dr. Warrick Parisian on Friday.  Patient agreeable.  Review of Systems:   Skin- 4-5 small scabbed over lesions on lower back and buttocks All other systems negative   Cardiac Risk Factors include: advanced age (>26mn, >>83women);diabetes mellitus;dyslipidemia;hypertension     Objective:     Vitals: BP 128/82   Pulse 79   Ht '5\' 4"'$  (1.626 m)   Wt 184 lb (83.5 kg)   BMI 31.58 kg/m   Body mass index is 31.58 kg/m.  Advanced Directives 12/14/2017 10/07/2016  Does Patient Have a Medical Advance Directive? No No  Would patient like information on creating a medical advance directive? Yes (MAU/Ambulatory/Procedural Areas - Information given) Yes (ED - Information included in AVS)    Tobacco Social History   Tobacco Use  Smoking Status Former Smoker  . Last attempt to quit: 07/18/1974  . Years since quitting: 43.4  Smokeless Tobacco Never Used       Clinical Intake:     Pain Score: 0-No pain                 Past Medical History:  Diagnosis Date  . Arthritis   . Dementia ([place]   . Diabetes mellitus  without complication ([place]   . GERD (gastroesophageal reflux disease)   . Hyperlipidemia   . Hypertension    Past Surgical History:  Procedure Laterality Date  . ABDOMINAL HYSTERECTOMY    . CHOLECYSTECTOMY    . THYROID CYST EXCISION    . TONSILLECTOMY AND ADENOIDECTOMY     Family History  Problem Relation Age of Onset  . Arthritis Mother   . Diabetes Mother   . Diabetes Son   . Hypertension Son   . Hyperlipidemia Son   . Hypertension Daughter    Social History   Socioeconomic History  . Marital status: Widowed    Spouse name: Not on file  . Number of children: 3  . Years of education: Not on file  . Highest education level: 3rd grade  Occupational History  . Occupation: Retired     Comment: SScientist, research (physical sciences) . Financial resource strain: Not hard at all  . Food insecurity:    Worry: Never true    Inability: Never true  . Transportation needs:    Medical: No    Non-medical: No  Tobacco Use  . Smoking status: Former Smoker    Last attempt to quit: 07/18/1974    Years since quitting: 43.4  . Smokeless tobacco: Never Used  Substance and  Sexual Activity  . Alcohol use: No  . Drug use: No  . Sexual activity: Not Currently  Lifestyle  . Physical activity:    Days per week: 3 days    Minutes per session: 60 min  . Stress: Very much  Relationships  . Social connections:    Talks on phone: Once a week    Gets together: More than three times a week    Attends religious service: Never    Active member of club or organization: Yes    Attends meetings of clubs or organizations: More than 4 times per year    Relationship status: Widowed  Other Topics Concern  . Not on file  Social History Narrative   Lives with son and daughter in law.    Outpatient Encounter Medications as of 12/14/2017  Medication Sig  . ACCU-CHEK AVIVA PLUS test strip CHECK BLOOD SUGAR TWICE DAILY  . ACCU-CHEK SOFTCLIX LANCETS lancets TEST BLOOD SUGAR TWICE DAILY  . Blood Glucose  Monitoring Suppl (ACCU-CHEK AVIVA PLUS) W/DEVICE KIT Use to check sugar bid. DX E11.9  . calcium carbonate (OS-CAL) 600 MG TABS tablet Take 600 mg by mouth 2 (two) times daily with a meal.  . citalopram (CELEXA) 40 MG tablet Take 1 tablet (40 mg total) by mouth daily. (Needs to be seen in November)  . donepezil (ARICEPT) 10 MG tablet Take 1 tablet (10 mg total) by mouth at bedtime. (Needs to be seen in November)  . furosemide (LASIX) 40 MG tablet TAKE 1 TABLET EVERY DAY  . glipiZIDE (GLUCOTROL) 10 MG tablet TAKE 1 TABLET TWICE DAILY  . losartan (COZAAR) 50 MG tablet TAKE 1 TABLET EVERY DAY  . lovastatin (MEVACOR) 20 MG tablet TAKE 1 TABLET EVERY DAY  . Melatonin 10 MG CAPS Take 10 mg by mouth at bedtime.  . metFORMIN (GLUCOPHAGE) 500 MG tablet TAKE 1 TABLET EVERY MORNING  AND TAKE 2 TABLETS EVERY EVENING  . omeprazole (PRILOSEC) 40 MG capsule TAKE 1 CAPSULE (40 MG TOTAL) BY MOUTH DAILY.  . Probiotic Product (PROBIOTIC DAILY PO) Take by mouth.  . traZODone (DESYREL) 50 MG tablet Take 0.5-1 tablets (25-50 mg total) by mouth at bedtime as needed for sleep.   No facility-administered encounter medications on file as of 12/14/2017.     Activities of Daily Living In your present state of health, do you have any difficulty performing the following activities: 12/14/2017  Hearing? N  Vision? Y  Comment When wearing glasses for too long, eyes get tired- has appointment with glaucoma specialist 01/11/2018  Difficulty concentrating or making decisions? Y  Comment Trouble concentrating, remembering and making decision  Walking or climbing stairs? Y  Comment Trouble climbing stairs due to balance issues  Dressing or bathing? N  Doing errands, shopping? Y  Comment Does not drive, son provides Copywriter, advertising and eating ? N  Using the Toilet? N  In the past six months, have you accidently leaked urine? Y  Comment Wears depends   Do you have problems with loss of bowel control? Y    Comment Wears depends - plans to discuss with Dr. Warrick Parisian 12/17/17  Managing your Medications? Y  Comment Son manages medications  Managing your Finances? Y  Comment Son Engineer, production or managing your Housekeeping? Y  Comment Lives with son and his wife- they manage household chores  Some recent data might be hidden    Patient Care Team: Dettinger, Fransisca Kaufmann, MD as PCP - General (Family Medicine) Rona Ravens,  Truc Ly, OD (Optometry)    Assessment:   This is a routine wellness examination for [place].  Exercise Activities and Dietary recommendations  Patient states she eats 2-3 meals per day.  She states she has access to all the food she needs.  Recommended diet of mostly vegetables, lean proteins, and whole grains and fruit in moderation.   Current Exercise Habits: Structured exercise class, Time (Minutes): 60, Frequency (Times/Week): 3, Weekly Exercise (Minutes/Week): 180, Intensity: Moderate  Goals    . Exercise 3x per week (30 min per time)     Continue exercising at the [employer] 3 times per week for 1 hour.     Marland Kitchen HEMOGLOBIN A1C < 7.0     Continue watching carbohydrate intake, taking medications appropriately, and exercise regimen.       Fall Risk Fall Risk  12/14/2017 04/29/2017 03/24/2017 10/07/2016 09/24/2016  Falls in the past year? 1 Yes Yes No Yes  Number falls in past yr: 0 2 or more 2 or more - 1  Injury with Fall? 1 No No - No  Risk for fall due to : Impaired balance/gait - - - -  Follow up Education provided;Falls prevention discussed - - - -   Is the patient's home free of loose throw rugs in walkways, pet beds, electrical cords, etc?   yes      Grab bars in the bathroom? yes      Handrails on the stairs?   no stairs in home      Adequate lighting?   yes    Depression Screen PHQ 2/9 Scores 12/14/2017 03/24/2017 10/07/2016 09/24/2016  PHQ - 2 Score 6 2 0 0  PHQ- 9 Score 14 9 - -   Recommended patient go for counseling to discuss  stressors.  Offered list of counselors in the area.  Patient states she is not interested.   Cognitive Function MMSE - Mini Mental State Exam 10/07/2016  Orientation to time 3  Orientation to Place 4  Registration 3  Attention/ Calculation 0  Recall 1  Language- name 2 objects 2  Language- repeat 1  Language- follow 3 step command 3  Language- read & follow direction 1  Write a sentence 1  Copy design 1  Total score 20     6CIT Screen 12/14/2017  What Year? 0 points  What month? 0 points  What time? 0 points  Count back from 20 0 points  Months in reverse 2 points  Repeat phrase 6 points  Total Score 8    Immunization History  Administered Date(s) Administered  . Influenza, High Dose Seasonal PF 12/13/2015, 10/07/2016, 12/14/2017  . Influenza,inj,Quad PF,6+ Mos 01/09/2015  . Pneumococcal Conjugate-13 09/24/2016  . Pneumococcal Polysaccharide-23 12/14/2017    Qualifies for Shingles Vaccine? Yes, declined today  Screening Tests Health Maintenance  Topic Date Due  . HEMOGLOBIN A1C  09/24/2017  . TETANUS/TDAP  12/15/2018 (Originally 07/17/1959)  . FOOT EXAM  03/25/2018  . OPHTHALMOLOGY EXAM  11/12/2018  . INFLUENZA VACCINE  Completed  . PNA vac Low Risk Adult  Completed  . DEXA SCAN  Addressed   Recommend Hemoglobin A1c at visit with Dr. Warrick Parisian on 12/17/17.  tdap declined today  Cancer Screenings: Lung: Low Dose CT Chest recommended if Age 10-80 years, 30 pack-year currently smoking OR have quit w/in 15years. Patient does not qualify. Breast:  Up to date on Mammogram? Recommend patient to discuss with Dr. Warrick Parisian at visit 12/17/17  Up to date of Bone  Density/Dexa? Yes Colorectal: Recommend patient to discuss with Dr. Warrick Parisian at visit 12/17/17.  She states she usually gets colonoscopies every 3 year due to history of polyps.  States her last colonoscopy was in 2017.   Additional Screenings:  Hepatitis C Screening:  Not indicated     Plan:     Work on your  goal of continuing to exercise at least 3 times per week.   Review the information given on Advance Directives.  If you complete the paperwork, please bring a copy to our office to be filed in your medical record. Follow up with Dr. Warrick Parisian as scheduled.  I have personally reviewed and noted the following in the patient's chart:   . Medical and social history . Use of alcohol, tobacco or illicit drugs  . Current medications and supplements . Functional ability and status . Nutritional status . Physical activity . Advanced directives . List of other physicians . Hospitalizations, surgeries, and ER visits in previous 12 months . Vitals . Screenings to include cognitive, depression, and falls . Referrals and appointments  In addition, I have reviewed and discussed with patient certain preventive protocols, quality metrics, and best practice recommendations. A written personalized care plan for preventive services as well as general preventive health recommendations were provided to patient.     WYATT, AMY M, RN  12/14/2017  I have reviewed and agree with the above AWV documentation.   Evelina Dun, FNP

## 2017-12-14 NOTE — Patient Instructions (Signed)
Please work on your goal of continuing to exercise at least 3 times per week.    Please review the information given on Advance Directives.  If you complete the paperwork, please bring a copy to our office to be filed in your medical record.  Please follow up with Dr. Warrick Parisian as scheduled.  Thank you for coming in for your Annual Wellness Visit today!!  Preventive Care 65 Years and Older, Female Preventive care refers to lifestyle choices and visits with your health care provider that can promote health and wellness. What does preventive care include?  A yearly physical exam. This is also called an annual well check.  Dental exams once or twice a year.  Routine eye exams. Ask your health care provider how often you should have your eyes checked.  Personal lifestyle choices, including: ? Daily care of your teeth and gums. ? Regular physical activity. ? Eating a healthy diet. ? Avoiding tobacco and drug use. ? Limiting alcohol use. ? Practicing safe sex. ? Taking low-dose aspirin every day. ? Taking vitamin and mineral supplements as recommended by your health care provider. What happens during an annual well check? The services and screenings done by your health care provider during your annual well check will depend on your age, overall health, lifestyle risk factors, and family history of disease. Counseling Your health care provider may ask you questions about your:  Alcohol use.  Tobacco use.  Drug use.  Emotional well-being.  Home and relationship well-being.  Sexual activity.  Eating habits.  History of falls.  Memory and ability to understand (cognition).  Work and work Statistician.  Reproductive health.  Screening You may have the following tests or measurements:  Height, weight, and BMI.  Blood pressure.  Lipid and cholesterol levels. These may be checked every 5 years, or more frequently if you are over 36 years old.  Skin check.  Lung  cancer screening. You may have this screening every year starting at age 13 if you have a 30-pack-year history of smoking and currently smoke or have quit within the past 15 years.  Fecal occult blood test (FOBT) of the stool. You may have this test every year starting at age 24.  Flexible sigmoidoscopy or colonoscopy. You may have a sigmoidoscopy every 5 years or a colonoscopy every 10 years starting at age 44.  Hepatitis C blood test.  Hepatitis B blood test.  Sexually transmitted disease (STD) testing.  Diabetes screening. This is done by checking your blood sugar (glucose) after you have not eaten for a while (fasting). You may have this done every 1-3 years.  Bone density scan. This is done to screen for osteoporosis. You may have this done starting at age 68.  Mammogram. This may be done every 1-2 years. Talk to your health care provider about how often you should have regular mammograms.  Talk with your health care provider about your test results, treatment options, and if necessary, the need for more tests. Vaccines Your health care provider may recommend certain vaccines, such as:  Influenza vaccine. This is recommended every year.  Tetanus, diphtheria, and acellular pertussis (Tdap, Td) vaccine. You may need a Td booster every 10 years.  Varicella vaccine. You may need this if you have not been vaccinated.  Zoster vaccine. You may need this after age 36.  Measles, mumps, and rubella (MMR) vaccine. You may need at least one dose of MMR if you were born in 1957 or later. You may also need  a second dose.  Pneumococcal 13-valent conjugate (PCV13) vaccine. One dose is recommended after age 44.  Pneumococcal polysaccharide (PPSV23) vaccine. One dose is recommended after age 74.  Meningococcal vaccine. You may need this if you have certain conditions.  Hepatitis A vaccine. You may need this if you have certain conditions or if you travel or work in places where you may be  exposed to hepatitis A.  Hepatitis B vaccine. You may need this if you have certain conditions or if you travel or work in places where you may be exposed to hepatitis B.  Haemophilus influenzae type b (Hib) vaccine. You may need this if you have certain conditions.  Talk to your health care provider about which screenings and vaccines you need and how often you need them. This information is not intended to replace advice given to you by your health care provider. Make sure you discuss any questions you have with your health care provider. Document Released: 01/25/2015 Document Revised: 09/18/2015 Document Reviewed: 10/30/2014 Elsevier Interactive Patient Education  2018 [employer].   Diabetes Mellitus and Nutrition When you have diabetes (diabetes mellitus), it is very important to have healthy eating habits because your blood sugar (glucose) levels are greatly affected by what you eat and drink. Eating healthy foods in the appropriate amounts, at about the same times every day, can help you:  Control your blood glucose.  Lower your risk of heart disease.  Improve your blood pressure.  Reach or maintain a healthy weight.  Every person with diabetes is different, and each person has different needs for a meal plan. Your health care provider may recommend that you work with a diet and nutrition specialist (dietitian) to make a meal plan that is best for you. Your meal plan may vary depending on factors such as:  The calories you need.  The medicines you take.  Your weight.  Your blood glucose, blood pressure, and cholesterol levels.  Your activity level.  Other health conditions you have, such as heart or kidney disease.  How do carbohydrates affect me? Carbohydrates affect your blood glucose level more than any other type of food. Eating carbohydrates naturally increases the amount of glucose in your blood. Carbohydrate counting is a method for keeping track of how many  carbohydrates you eat. Counting carbohydrates is important to keep your blood glucose at a healthy level, especially if you use insulin or take certain oral diabetes medicines. It is important to know how many carbohydrates you can safely have in each meal. This is different for every person. Your dietitian can help you calculate how many carbohydrates you should have at each meal and for snack. Foods that contain carbohydrates include:  Bread, cereal, rice, pasta, and crackers.  Potatoes and corn.  Peas, beans, and lentils.  Milk and yogurt.  Fruit and juice.  Desserts, such as cakes, cookies, ice cream, and candy.  How does alcohol affect me? Alcohol can cause a sudden decrease in blood glucose (hypoglycemia), especially if you use insulin or take certain oral diabetes medicines. Hypoglycemia can be a life-threatening condition. Symptoms of hypoglycemia (sleepiness, dizziness, and confusion) are similar to symptoms of having too much alcohol. If your health care provider says that alcohol is safe for you, follow these guidelines:  Limit alcohol intake to no more than 1 drink per day for nonpregnant women and 2 drinks per day for men. One drink equals 12 oz of beer, 5 oz of wine, or 1 oz of hard liquor.  Do not drink on an empty stomach.  Keep yourself hydrated with water, diet soda, or unsweetened iced tea.  Keep in mind that regular soda, juice, and other mixers may contain a lot of sugar and must be counted as carbohydrates.  What are tips for following this plan? Reading food labels  Start by checking the serving size on the label. The amount of calories, carbohydrates, fats, and other nutrients listed on the label are based on one serving of the food. Many foods contain more than one serving per package.  Check the total grams (g) of carbohydrates in one serving. You can calculate the number of servings of carbohydrates in one serving by dividing the total carbohydrates by 15.  For example, if a food has 30 g of total carbohydrates, it would be equal to 2 servings of carbohydrates.  Check the number of grams (g) of saturated and trans fats in one serving. Choose foods that have low or no amount of these fats.  Check the number of milligrams (mg) of sodium in one serving. Most people should limit total sodium intake to less than 2,300 mg per day.  Always check the nutrition information of foods labeled as "low-fat" or "nonfat". These foods may be higher in added sugar or refined carbohydrates and should be avoided.  Talk to your dietitian to identify your daily goals for nutrients listed on the label. Shopping  Avoid buying canned, premade, or processed foods. These foods tend to be high in fat, sodium, and added sugar.  Shop around the outside edge of the grocery store. This includes fresh fruits and vegetables, bulk grains, fresh meats, and fresh dairy. Cooking  Use low-heat cooking methods, such as baking, instead of high-heat cooking methods like deep frying.  Cook using healthy oils, such as olive, canola, or sunflower oil.  Avoid cooking with butter, cream, or high-fat meats. Meal planning  Eat meals and snacks regularly, preferably at the same times every day. Avoid going long periods of time without eating.  Eat foods high in fiber, such as fresh fruits, vegetables, beans, and whole grains. Talk to your dietitian about how many servings of carbohydrates you can eat at each meal.  Eat 4-6 ounces of lean protein each day, such as lean meat, chicken, fish, eggs, or tofu. 1 ounce is equal to 1 ounce of meat, chicken, or fish, 1 egg, or 1/4 cup of tofu.  Eat some foods each day that contain healthy fats, such as avocado, nuts, seeds, and fish. Lifestyle   Check your blood glucose regularly.  Exercise at least 30 minutes 5 or more days each week, or as told by your health care provider.  Take medicines as told by your health care provider.  Do not  use any products that contain nicotine or tobacco, such as cigarettes and e-cigarettes. If you need help quitting, ask your health care provider.  Work with a Social worker or diabetes educator to identify strategies to manage stress and any emotional and social challenges. What are some questions to ask my health care provider?  Do I need to meet with a diabetes educator?  Do I need to meet with a dietitian?  What number can I call if I have questions?  When are the best times to check my blood glucose? Where to find more information:  American Diabetes Association: diabetes.org/food-and-fitness/food  Academy of Nutrition and Dietetics: PokerClues.dk  [employer] of Diabetes and Digestive and Kidney Diseases (NIH): ContactWire.be Summary  A healthy meal plan  will help you control your blood glucose and maintain a healthy lifestyle.  Working with a diet and nutrition specialist (dietitian) can help you make a meal plan that is best for you.  Keep in mind that carbohydrates and alcohol have immediate effects on your blood glucose levels. It is important to count carbohydrates and to use alcohol carefully. This information is not intended to replace advice given to you by your health care provider. Make sure you discuss any questions you have with your health care provider. Document Released: 09/25/2004 Document Revised: 02/03/2016 Document Reviewed: 02/03/2016 Elsevier Interactive Patient Education  [employer].

## 2017-12-17 ENCOUNTER — Ambulatory Visit (INDEPENDENT_AMBULATORY_CARE_PROVIDER_SITE_OTHER): Payer: Medicare HMO | Admitting: Family Medicine

## 2017-12-17 ENCOUNTER — Encounter: Payer: Self-pay | Admitting: Family Medicine

## 2017-12-17 ENCOUNTER — Other Ambulatory Visit: Payer: Self-pay

## 2017-12-17 VITALS — BP 132/73 | HR 85 | Temp 98.5°F | Ht 64.0 in | Wt 184.0 lb

## 2017-12-17 DIAGNOSIS — E119 Type 2 diabetes mellitus without complications: Secondary | ICD-10-CM

## 2017-12-17 DIAGNOSIS — E1159 Type 2 diabetes mellitus with other circulatory complications: Secondary | ICD-10-CM | POA: Diagnosis not present

## 2017-12-17 DIAGNOSIS — Z1211 Encounter for screening for malignant neoplasm of colon: Secondary | ICD-10-CM

## 2017-12-17 DIAGNOSIS — E785 Hyperlipidemia, unspecified: Secondary | ICD-10-CM

## 2017-12-17 DIAGNOSIS — I10 Essential (primary) hypertension: Secondary | ICD-10-CM | POA: Diagnosis not present

## 2017-12-17 DIAGNOSIS — G301 Alzheimer's disease with late onset: Secondary | ICD-10-CM | POA: Diagnosis not present

## 2017-12-17 DIAGNOSIS — K219 Gastro-esophageal reflux disease without esophagitis: Secondary | ICD-10-CM

## 2017-12-17 DIAGNOSIS — E1169 Type 2 diabetes mellitus with other specified complication: Secondary | ICD-10-CM | POA: Diagnosis not present

## 2017-12-17 DIAGNOSIS — R42 Dizziness and giddiness: Secondary | ICD-10-CM

## 2017-12-17 DIAGNOSIS — F0280 Dementia in other diseases classified elsewhere without behavioral disturbance: Secondary | ICD-10-CM

## 2017-12-17 DIAGNOSIS — I152 Hypertension secondary to endocrine disorders: Secondary | ICD-10-CM

## 2017-12-17 LAB — BAYER DCA HB A1C WAIVED: HB A1C (BAYER DCA - WAIVED): 6.7 % (ref <7.0)

## 2017-12-17 MED ORDER — GLIPIZIDE 10 MG PO TABS: 10.0000 mg | ORAL_TABLET | Freq: Two times a day (BID) | ORAL | 3 refills | Status: DC

## 2017-12-17 MED ORDER — CITALOPRAM HYDROBROMIDE 40 MG PO TABS: 40.0000 mg | ORAL_TABLET | Freq: Every day | ORAL | 3 refills | Status: DC

## 2017-12-17 MED ORDER — CALCIUM CARBONATE 600 MG PO TABS: 600.0000 mg | ORAL_TABLET | Freq: Two times a day (BID) | ORAL | 3 refills | Status: AC

## 2017-12-17 MED ORDER — TRAZODONE HCL 50 MG PO TABS: 25.0000 mg | ORAL_TABLET | Freq: Every evening | ORAL | 3 refills | Status: DC | PRN

## 2017-12-17 MED ORDER — LOVASTATIN 20 MG PO TABS: 20.0000 mg | ORAL_TABLET | Freq: Every day | ORAL | 3 refills | Status: DC

## 2017-12-17 MED ORDER — DONEPEZIL HCL 10 MG PO TABS: 10.0000 mg | ORAL_TABLET | Freq: Every day | ORAL | 3 refills | Status: DC

## 2017-12-17 MED ORDER — METFORMIN HCL 500 MG PO TABS: ORAL_TABLET | ORAL | 3 refills | Status: DC

## 2017-12-17 MED ORDER — OMEPRAZOLE 40 MG PO CPDR: 40.0000 mg | DELAYED_RELEASE_CAPSULE | Freq: Every day | ORAL | 3 refills | Status: DC

## 2017-12-17 MED ORDER — MELATONIN 10 MG PO CAPS: 10.0000 mg | ORAL_CAPSULE | Freq: Every day | ORAL | 3 refills | Status: DC

## 2017-12-17 MED ORDER — LOSARTAN POTASSIUM 50 MG PO TABS: 50.0000 mg | ORAL_TABLET | Freq: Every day | ORAL | 3 refills | Status: DC

## 2017-12-17 MED ORDER — FUROSEMIDE 40 MG PO TABS: 40.0000 mg | ORAL_TABLET | Freq: Every day | ORAL | 3 refills | Status: DC

## 2017-12-17 NOTE — Progress Notes (Signed)
BP 132/73   Pulse 85   Temp 98.5 F (36.9 C) (Oral)   Ht '5\' 4"'$  (1.626 m)   Wt 184 lb (83.5 kg)   BMI 31.58 kg/m    Subjective:    Patient ID: Linda Fletcher, female    DOB: 12/23/1940, 77 y.o.   MRN: [id number]  HPI: Linda Fletcher is a 77 y.o. female presenting on 12/17/2017 for Dementia (Patient would like to move away to [place] and son does not think it is a good idea due to her Demenia.);  Diabetes (check up of chronic medical conditions); and Hypertension   HPI Type 2 diabetes mellitus Patient comes in today for recheck of his diabetes. Patient has been currently taking metformin and glipizide. Patient is currently on an ACE inhibitor/ARB. Patient has seen an ophthalmologist this year. Patient denies any issues with their feet.   GERD Patient is currently on OMeprazole.  She denies any major symptoms or abdominal pain or belching or burping. She denies any blood in her stool or lightheadedness or dizziness.   Hyperlipidemia Patient is coming in for recheck of his hyperlipidemia. The patient is currently taking lovastatin. They deny any issues with myalgias or history of liver damage from it. They deny any focal numbness or weakness or chest pain.   Hypertension Patient is currently on losartan, and their blood pressure today is 132/73. Patient denies any dizziness.  Patient had a couple episodes of positional lightheadedness over the past 6 months due to her age will make sure she does not have any valvular issues by getting an echocardiogram.  Patient denies headaches, blurred vision, chest pains, shortness of breath, or weakness. Denies any side effects from medication and is content with current medication.   Patient has Alzheimer's but lives with her son and he takes care of her a lot helps manage.  She is symptom is very lucid but also sometimes has very difficult memory issues.  Patient takes Aricept for memory and trazodone for sleep and Celexa for moods associated with her  dementia  Relevant past medical, surgical, family and social history reviewed and updated as indicated. Interim medical history since our last visit reviewed. Allergies and medications reviewed and updated.  Review of Systems  Constitutional: Negative for chills and fever.  HENT: Negative for congestion, ear discharge and ear pain.   Eyes: Negative for redness and visual disturbance.  Respiratory: Negative for chest tightness and shortness of breath.   Cardiovascular: Negative for chest pain and leg swelling.  Genitourinary: Negative for difficulty urinating and dysuria.  Musculoskeletal: Negative for back pain and gait problem.  Skin: Negative for rash.  Neurological: Positive for light-headedness. Negative for dizziness and headaches.  Psychiatric/Behavioral: Negative for agitation and behavioral problems.  All other systems reviewed and are negative.   Per HPI unless specifically indicated above   Allergies as of 12/17/2017   No Known Allergies     Medication List        Accurate as of 12/17/17  2:25 PM. Always use your most recent med list.          ACCU-CHEK AVIVA PLUS test strip Generic drug:  glucose blood CHECK BLOOD SUGAR TWICE DAILY   ACCU-CHEK AVIVA PLUS w/Device Kit Use to check sugar bid. DX E11.9   ACCU-CHEK SOFTCLIX LANCETS lancets TEST BLOOD SUGAR TWICE DAILY   calcium carbonate 600 MG Tabs tablet Commonly known as:  OS-CAL Take 1 tablet (600 mg total) by mouth 2 (two) times daily with a  meal.   citalopram 40 MG tablet Commonly known as:  CELEXA Take 1 tablet (40 mg total) by mouth daily. (Needs to be seen in November)   donepezil 10 MG tablet Commonly known as:  ARICEPT Take 1 tablet (10 mg total) by mouth at bedtime. (Needs to be seen in November)   furosemide 40 MG tablet Commonly known as:  LASIX Take 1 tablet (40 mg total) by mouth daily.   glipiZIDE 10 MG tablet Commonly known as:  GLUCOTROL Take 1 tablet (10 mg total) by mouth 2 (two)  times daily.   losartan 50 MG tablet Commonly known as:  COZAAR Take 1 tablet (50 mg total) by mouth daily.   lovastatin 20 MG tablet Commonly known as:  MEVACOR Take 1 tablet (20 mg total) by mouth daily.   Melatonin 10 MG Caps Take 10 mg by mouth at bedtime.   metFORMIN 500 MG tablet Commonly known as:  GLUCOPHAGE TAKE 1 TABLET EVERY MORNING  AND TAKE 2 TABLETS EVERY EVENING   omeprazole 40 MG capsule Commonly known as:  PRILOSEC Take 1 capsule (40 mg total) by mouth daily.   PROBIOTIC DAILY PO Take by mouth.   traZODone 50 MG tablet Commonly known as:  DESYREL Take 0.5-1 tablets (25-50 mg total) by mouth at bedtime as needed for sleep.          Objective:    BP 132/73   Pulse 85   Temp 98.5 F (36.9 C) (Oral)   Ht '5\' 4"'$  (1.626 m)   Wt 184 lb (83.5 kg)   BMI 31.58 kg/m   Wt Readings from Last 3 Encounters:  12/17/17 184 lb (83.5 kg)  12/14/17 184 lb (83.5 kg)  04/29/17 178 lb (80.7 kg)    Physical Exam Vitals signs and nursing note reviewed.  Constitutional:      General: She is not in acute distress.    Appearance: She is well-developed. She is not diaphoretic.  Eyes:     Conjunctiva/sclera: Conjunctivae normal.  Cardiovascular:     Rate and Rhythm: Normal rate and regular rhythm.     Heart sounds: Normal heart sounds. No murmur.  Pulmonary:     Effort: Pulmonary effort is normal. No respiratory distress.     Breath sounds: Normal breath sounds. No wheezing.  Musculoskeletal: Normal range of motion.        General: No tenderness.  Skin:    General: Skin is warm and dry.     Findings: No rash.  Neurological:     Mental Status: She is alert and oriented to person, place, and time.     Coordination: Coordination normal.  Psychiatric:        Behavior: Behavior normal.        Cognition and Memory: She exhibits impaired recent memory. She does not exhibit impaired remote memory.     Results for orders placed or performed in visit on 12/01/17  HM  DIABETES EYE EXAM  Result Value Ref Range   HM Diabetic Eye Exam No Retinopathy No Retinopathy      Assessment & Plan:   Problem List Items Addressed This Visit      Cardiovascular and Mediastinum   Hypertension associated with diabetes ([place]) - Primary   Relevant Medications   glipiZIDE (GLUCOTROL) 10 MG tablet   furosemide (LASIX) 40 MG tablet   losartan (COZAAR) 50 MG tablet   lovastatin (MEVACOR) 20 MG tablet   metFORMIN (GLUCOPHAGE) 500 MG tablet   Other Relevant Orders  CMP14+EGFR (Completed)   ECHOCARDIOGRAM COMPLETE     Digestive   GERD (gastroesophageal reflux disease)   Relevant Medications   omeprazole (PRILOSEC) 40 MG capsule   Other Relevant Orders   CBC with Differential/Platelet (Completed)     Endocrine   Diabetes mellitus without complication (HCC)   Relevant Medications   glipiZIDE (GLUCOTROL) 10 MG tablet   losartan (COZAAR) 50 MG tablet   lovastatin (MEVACOR) 20 MG tablet   metFORMIN (GLUCOPHAGE) 500 MG tablet   Other Relevant Orders   Bayer DCA Hb A1c Waived (Completed)   CMP14+EGFR (Completed)   Hyperlipidemia associated with type 2 diabetes mellitus (HCC)   Relevant Medications   glipiZIDE (GLUCOTROL) 10 MG tablet   losartan (COZAAR) 50 MG tablet   lovastatin (MEVACOR) 20 MG tablet   metFORMIN (GLUCOPHAGE) 500 MG tablet   Other Relevant Orders   Lipid panel (Completed)     Nervous and Auditory   Dementia (HCC)   Relevant Medications   citalopram (CELEXA) 40 MG tablet   donepezil (ARICEPT) 10 MG tablet   traZODone (DESYREL) 50 MG tablet    Other Visit Diagnoses    Positional lightheadedness       Patient had a couple episodes of lightheadedness 6 months ago and never got an echocardiogram, we will send her for that   Relevant Orders   ECHOCARDIOGRAM COMPLETE    Patient had some complaints about wanting to live on her own but she is fine and satisfied today, she discussed back and forth in some of that as her dementia with mood  swings.  Follow up plan: Return in about 3 months (around 03/18/2018), or if symptoms worsen or fail to improve, for Type 2 diabetes.  Counseling provided for all of the vaccine components Orders Placed This Encounter  Procedures  . Bayer DCA Hb A1c Waived  . CBC with Differential/Platelet  . CMP14+EGFR  . Lipid panel  . ECHOCARDIOGRAM COMPLETE    Caryl Pina, MD [facility] 12/17/2017, 2:25 PM

## 2017-12-18 LAB — CMP14+EGFR
ALT: 23 IU/L (ref 0–32)
AST: 24 IU/L (ref 0–40)
Albumin/Globulin Ratio: 1.6 (ref 1.2–2.2)
Albumin: 4.1 g/dL (ref 3.5–4.8)
Alkaline Phosphatase: 79 IU/L (ref 39–117)
BUN/Creatinine Ratio: 20 (ref 12–28)
BUN: 18 mg/dL (ref 8–27)
Bilirubin Total: 0.2 mg/dL (ref 0.0–1.2)
CO2: 25 mmol/L (ref 20–29)
Calcium: 9.6 mg/dL (ref 8.7–10.3)
Chloride: 96 mmol/L (ref 96–106)
Creatinine, Ser: 0.91 mg/dL (ref 0.57–1.00)
GFR calc Af Amer: 70 mL/min/{1.73_m2} (ref >59)
GFR calc non Af Amer: 61 mL/min/{1.73_m2} (ref >59)
Globulin, Total: 2.6 g/dL (ref 1.5–4.5)
Glucose: 236 mg/dL — ABNORMAL HIGH (ref 65–99)
Potassium: 3.6 mmol/L (ref 3.5–5.2)
Sodium: 140 mmol/L (ref 134–144)
Total Protein: 6.7 g/dL (ref 6.0–8.5)

## 2017-12-18 LAB — LIPID PANEL
Chol/HDL Ratio: 4.6 ratio — ABNORMAL HIGH (ref 0.0–4.4)
Cholesterol, Total: 187 mg/dL (ref 100–199)
HDL: 41 mg/dL (ref >39)
Triglycerides: 402 mg/dL — ABNORMAL HIGH (ref 0–149)

## 2017-12-18 LAB — CBC WITH DIFFERENTIAL/PLATELET
BASOS: 1 %
Basophils Absolute: 0 10*3/uL (ref 0.0–0.2)
EOS (ABSOLUTE): 0.1 10*3/uL (ref 0.0–0.4)
Eos: 2 %
Hematocrit: 34.7 % (ref 34.0–46.6)
Hemoglobin: 11.3 g/dL (ref 11.1–15.9)
Immature Grans (Abs): 0 10*3/uL (ref 0.0–0.1)
Immature Granulocytes: 0 %
Lymphocytes Absolute: 1.2 10*3/uL (ref 0.7–3.1)
Lymphs: 18 %
MCH: 24.8 pg — ABNORMAL LOW (ref 26.6–33.0)
MCHC: 32.6 g/dL (ref 31.5–35.7)
MCV: 76 fL — AB (ref 79–97)
Monocytes Absolute: 0.4 10*3/uL (ref 0.1–0.9)
Monocytes: 7 %
Neutrophils Absolute: 4.9 10*3/uL (ref 1.4–7.0)
Neutrophils: 72 %
Platelets: 201 10*3/uL (ref 150–450)
RBC: 4.56 x10E6/uL (ref 3.77–5.28)
RDW: 15.9 % — ABNORMAL HIGH (ref 12.3–15.4)
WBC: 6.7 10*3/uL (ref 3.4–10.8)

## 2018-01-10 ENCOUNTER — Ambulatory Visit (HOSPITAL_COMMUNITY)
Admission: RE | Admit: 2018-01-10 | Discharge: 2018-01-10 | Disposition: A | Discharge status: Home / Self Care | Payer: Medicare HMO | Source: Ambulatory Visit | Attending: Family Medicine | Admitting: Family Medicine

## 2018-01-10 DIAGNOSIS — I10 Essential (primary) hypertension: Secondary | ICD-10-CM

## 2018-01-10 DIAGNOSIS — R42 Dizziness and giddiness: Secondary | ICD-10-CM | POA: Diagnosis not present

## 2018-01-10 DIAGNOSIS — E1159 Type 2 diabetes mellitus with other circulatory complications: Secondary | ICD-10-CM

## 2018-01-10 NOTE — Progress Notes (Signed)
*  PRELIMINARY RESULTS* Echocardiogram 2D Echocardiogram has been performed.  Linda Fletcher 01/10/2018, 12:08 PM

## 2018-01-18 ENCOUNTER — Encounter: Payer: Self-pay | Admitting: *Deleted

## 2018-02-11 DIAGNOSIS — H401131 Primary open-angle glaucoma, bilateral, mild stage: Secondary | ICD-10-CM | POA: Diagnosis not present

## 2018-02-15 ENCOUNTER — Encounter: Payer: Self-pay | Admitting: Gastroenterology

## 2018-03-09 ENCOUNTER — Encounter: Payer: Self-pay | Admitting: Gastroenterology

## 2018-03-09 ENCOUNTER — Ambulatory Visit: Payer: Medicare HMO | Admitting: Gastroenterology

## 2018-03-09 ENCOUNTER — Encounter (INDEPENDENT_AMBULATORY_CARE_PROVIDER_SITE_OTHER): Payer: Self-pay

## 2018-03-09 VITALS — BP 130/70 | HR 68 | Ht 64.0 in | Wt 181.0 lb

## 2018-03-09 DIAGNOSIS — Z86010 Personal history of colonic polyps: Secondary | ICD-10-CM

## 2018-03-09 DIAGNOSIS — R194 Change in bowel habit: Secondary | ICD-10-CM

## 2018-03-09 NOTE — Patient Instructions (Addendum)
If you are age 79 or older, your body mass index should be between 23-30. Your Body mass index is 31.07 kg/m. If this is out of the aforementioned range listed, please consider follow up with your Primary Care Provider.  It has been recommended to you by your physician that you have a(n) Colonoscopy completed. Per your request, we did not schedule the procedure(s) today. Please contact our office at [phone] should you decide to have the procedure completed.  A high fiber diet with plenty of fluids (up to 8 glasses of water daily) is suggested to relieve these symptoms.  Metamucil, 1 tablespoon once or twice daily can be used to keep bowels regular if needed.   Thank you for choosing me and [facility].    Dr.Beavers

## 2018-03-09 NOTE — Progress Notes (Addendum)
Referring Provider: Dettinger, Fransisca Kaufmann, MD Primary Care Physician:  Dettinger, Fransisca Kaufmann, MD   Reason for Consultation: History of colon polyps, diarrhea   IMPRESSION:  Change in bowel habits    - Normal random colonoscopy biopsies on colonoscopy 2016 History of colon polyps    -Records from [place] show polyps were removed on colonoscopies in 2008, 2011, 2014, in 2016    - 2/14:  3 small tubular adenomas, 11 mm tubular adenoma at the splenic flexure and a 17 mm hyperplastic polyp in the descending colon    - 2/16: hyperplastic polyps  She is due colonoscopy for colon cancer screening.  Given the change in her bowel habits will evaluate for colitis including random biopsies for microscopic colitis at the time of colonoscopy.   PLAN: Add daily psyllium for stool bulking agent (Metamucil) Colonoscopy with random colon biopsies  I consented the patient at the bedside today discussing the risks, benefits, and alternatives to endoscopic evaluation. In particular, we discussed the risks that include, but are not limited to, reaction to medication, cardiopulmonary compromise, bleeding requiring blood transfusion, aspiration resulting in pneumonia, perforation requiring surgery, lack of diagnosis, severe illness requiring hospitalization, and even death. We reviewed the risk of missed lesion including polyps or even cancer. The patient acknowledges these risks and asks that we proceed.  Although the patient was ready to proceed with colonoscopy, her son drove her to this appointment today.  He refused to allow her to have a colonoscopy at low [facility] gastroenterology and noted he would make arrangements for her to have a colonoscopy elsewhere.  We have offered to make these records available to her new providers wherever that may be.  However, she may also call and schedule a colonoscopy anytime at her convenience.   HPI: Linda Fletcher is a 78 y.o. female seen in consultation at the request of  Dr. Warrick Parisian for further evaluation of diarrhea.  The history is obtained through the patient and review of her referral records.   I reviewed records from [facility] gastroenterology in [place].  The patient was last seen there in 2016.  She had polyps removed on colonoscopies in 2008, 2011, 2014 (fair prep) and 2016 (6 polyps).  Pathology report from 2014 shows a 5 mm tubular adenoma in the cecum and a 5 mm tubular adenoma in the ascending colon, 4 mm tubular adenoma at the hepatic flexure, 11 mm tubular adenoma at the splenic flexure and a 17 mm hyperplastic polyp in the descending colon.  Pathology results report from 2016 shows hyperplastic polyps and benign colonic mucosa with hyperplastic features.  Surveillance colonoscopy recommended in 2019.  She also has GERD, bile reflux gastritis, and severe left-sided diverticulosis with associated narrowing. Her doctor for [place] performed a near annual EGD.  She has six months of diarrhea described as loose stools. Chronically has 5-6 BM daily.  Has not been previously evaluated by gastroenterologist.  However, for the last 6 months he continues to have 5-6 bowel movements daily but the stools are now loose.  No blood or mucous. Unintentional weight gain of 17-18.  Associated nausea, vomiting, abdominal pain, bloating, eructation, flatus.  Uses Imodium but this will actually result in constipation. No other associated symptoms. No identified exacerbating or relieving features. No other associated symptoms. No identified exacerbating or relieving features.   Longstanding history of heartburn that is well controlled on medications. Intermittent gas.  Uses omeprazole 40 mg daily. No dysphagia or odynophagia.    Past  Medical History:  Diagnosis Date  . Arthritis   . Dementia ([place])   . Depression   . Diabetes mellitus without complication ([place])   . GERD (gastroesophageal reflux disease)   . Glaucoma   . Hx of adenomatous colonic polyps   .  Hyperlipidemia   . Hypertension   . Obesity     Past Surgical History:  Procedure Laterality Date  . ABDOMINAL HYSTERECTOMY    . CHOLECYSTECTOMY    . THYROID CYST EXCISION    . TONSILLECTOMY AND ADENOIDECTOMY     age 89    Current Outpatient Medications  Medication Sig Dispense Refill  . ACCU-CHEK AVIVA PLUS test strip CHECK BLOOD SUGAR TWICE DAILY 200 each 3  . ACCU-CHEK SOFTCLIX LANCETS lancets TEST BLOOD SUGAR TWICE DAILY 200 each 2  . Blood Glucose Monitoring Suppl (ACCU-CHEK AVIVA PLUS) W/DEVICE KIT Use to check sugar bid. DX E11.9 1 kit 0  . calcium carbonate (OS-CAL) 600 MG TABS tablet Take 1 tablet (600 mg total) by mouth 2 (two) times daily with a meal. 180 tablet 3  . citalopram (CELEXA) 40 MG tablet Take 1 tablet (40 mg total) by mouth daily. (Needs to be seen in November) 90 tablet 3  . donepezil (ARICEPT) 10 MG tablet Take 1 tablet (10 mg total) by mouth at bedtime. (Needs to be seen in November) 90 tablet 3  . furosemide (LASIX) 40 MG tablet Take 1 tablet (40 mg total) by mouth daily. 90 tablet 3  . glipiZIDE (GLUCOTROL) 10 MG tablet Take 1 tablet (10 mg total) by mouth 2 (two) times daily. 180 tablet 3  . losartan (COZAAR) 50 MG tablet Take 1 tablet (50 mg total) by mouth daily. 90 tablet 3  . lovastatin (MEVACOR) 20 MG tablet Take 1 tablet (20 mg total) by mouth daily. 90 tablet 3  . Melatonin 10 MG CAPS Take 10 mg by mouth at bedtime. 90 capsule 3  . metFORMIN (GLUCOPHAGE) 500 MG tablet TAKE 1 TABLET EVERY MORNING  AND TAKE 2 TABLETS EVERY EVENING 270 tablet 3  . omeprazole (PRILOSEC) 40 MG capsule Take 1 capsule (40 mg total) by mouth daily. 90 capsule 3  . Probiotic Product (PROBIOTIC DAILY PO) Take by mouth.    . traZODone (DESYREL) 50 MG tablet Take 0.5-1 tablets (25-50 mg total) by mouth at bedtime as needed for sleep. 90 tablet 3   No current facility-administered medications for this visit.     Allergies as of 03/09/2018  . (No Known Allergies)    Family  History  Problem Relation Age of Onset  . Arthritis Mother   . Diabetes Mother   . Diabetes Son   . Hypertension Son   . Hyperlipidemia Son   . Hypertension Daughter     Social History   Socioeconomic History  . Marital status: Widowed    Spouse name: Not on file  . Number of children: 3  . Years of education: Not on file  . Highest education level: 3rd grade  Occupational History  . Occupation: Retired     Comment: Scientist, research (physical sciences)  . Financial resource strain: Not hard at all  . Food insecurity:    Worry: Never true    Inability: Never true  . Transportation needs:    Medical: No    Non-medical: No  Tobacco Use  . Smoking status: Former Smoker    Last attempt to quit: 07/18/1974    Years since quitting: 43.6  . Smokeless tobacco: Never Used  Substance and Sexual Activity  . Alcohol use: No  . Drug use: No  . Sexual activity: Not Currently  Lifestyle  . Physical activity:    Days per week: 3 days    Minutes per session: 60 min  . Stress: Very much  Relationships  . Social connections:    Talks on phone: Once a week    Gets together: More than three times a week    Attends religious service: Never    Active member of club or organization: Yes    Attends meetings of clubs or organizations: More than 4 times per year    Relationship status: Widowed  . Intimate partner violence:    Fear of current or ex partner: No    Emotionally abused: No    Physically abused: No    Forced sexual activity: No  Other Topics Concern  . Not on file  Social History Narrative   Lives with son and daughter in law.    Review of Systems: 12 system ROS is negative except as noted above with the additions of anxiety, arthritis, confusion, depression, muscle pains, night sweats, nosebleeds, insomnia, excessive thirst, excessive urination.  Filed Weights   03/09/18 1325  Weight: 181 lb (82.1 kg)    Physical Exam: Vital signs were reviewed. General:   Alert,  well-nourished, pleasant and cooperative in NAD Head:  Normocephalic and atraumatic. Eyes:  Sclera clear, no icterus.   Conjunctiva pink. Mouth:  No deformity or lesions.   Neck:  Supple; no thyromegaly. Lungs:  Clear throughout to auscultation.   No wheezes. Heart:  Regular rate and rhythm; no murmurs Abdomen:  Soft, nontender, normal bowel sounds. No rebound or guarding. No hepatosplenomegaly Rectal:  Deferred  Msk:  Symmetrical without gross deformities. Extremities:  No gross deformities or edema. Neurologic:  Alert and  oriented x4;  grossly nonfocal Skin:  No rash or bruise. Psych:  Alert and cooperative. Normal mood and affect.  Flat affect.   Elzie Sheets L. Tarri Glenn, MD, MPH [facility] 03/10/2018, 4:15 PM

## 2018-03-10 ENCOUNTER — Encounter: Payer: Self-pay | Admitting: Gastroenterology

## 2018-03-14 DIAGNOSIS — H25812 Combined forms of age-related cataract, left eye: Secondary | ICD-10-CM | POA: Diagnosis not present

## 2018-03-14 DIAGNOSIS — Z01818 Encounter for other preprocedural examination: Secondary | ICD-10-CM | POA: Diagnosis not present

## 2018-03-17 ENCOUNTER — Other Ambulatory Visit: Payer: Self-pay

## 2018-03-17 DIAGNOSIS — Z1211 Encounter for screening for malignant neoplasm of colon: Secondary | ICD-10-CM

## 2018-03-23 DIAGNOSIS — H401121 Primary open-angle glaucoma, left eye, mild stage: Secondary | ICD-10-CM | POA: Diagnosis not present

## 2018-03-23 DIAGNOSIS — H25812 Combined forms of age-related cataract, left eye: Secondary | ICD-10-CM | POA: Diagnosis not present

## 2018-03-27 ENCOUNTER — Other Ambulatory Visit: Payer: Self-pay | Admitting: Family Medicine

## 2018-03-28 NOTE — Telephone Encounter (Signed)
Last seen 12/17/17

## 2018-03-30 DIAGNOSIS — H25812 Combined forms of age-related cataract, left eye: Secondary | ICD-10-CM | POA: Diagnosis not present

## 2018-03-31 ENCOUNTER — Other Ambulatory Visit: Payer: Self-pay

## 2018-03-31 DIAGNOSIS — Z1211 Encounter for screening for malignant neoplasm of colon: Secondary | ICD-10-CM

## 2018-03-31 NOTE — Progress Notes (Unsigned)
gastro 

## 2018-05-23 DIAGNOSIS — R197 Diarrhea, unspecified: Secondary | ICD-10-CM | POA: Diagnosis not present

## 2018-06-06 DIAGNOSIS — R197 Diarrhea, unspecified: Secondary | ICD-10-CM | POA: Diagnosis not present

## 2018-06-06 DIAGNOSIS — E119 Type 2 diabetes mellitus without complications: Secondary | ICD-10-CM | POA: Diagnosis not present

## 2018-06-10 ENCOUNTER — Encounter: Payer: Self-pay | Admitting: Physician Assistant

## 2018-06-10 ENCOUNTER — Ambulatory Visit (INDEPENDENT_AMBULATORY_CARE_PROVIDER_SITE_OTHER): Payer: Medicare HMO | Admitting: Physician Assistant

## 2018-06-10 ENCOUNTER — Other Ambulatory Visit: Payer: Self-pay

## 2018-06-10 DIAGNOSIS — N3000 Acute cystitis without hematuria: Secondary | ICD-10-CM | POA: Diagnosis not present

## 2018-06-10 DIAGNOSIS — B9689 Other specified bacterial agents as the cause of diseases classified elsewhere: Secondary | ICD-10-CM | POA: Diagnosis not present

## 2018-06-10 MED ORDER — CIPROFLOXACIN HCL 250 MG PO TABS: 250.0000 mg | ORAL_TABLET | Freq: Two times a day (BID) | ORAL | 0 refills | Status: DC

## 2018-06-10 NOTE — Progress Notes (Signed)
Telephone visit  Subjective: WI:[id number] pain PCP: Dettinger, Fransisca Kaufmann, MD MEQ:[id number] [place] is a 78 y.o. female calls for telephone consult today. Patient provides verbal consent for consult held via phone.  Patient is identified with 2 separate identifiers.  At this time the entire area is on COVID-19 social distancing and stay home orders are in place.  Patient is of higher risk and therefore we are performing this by a virtual method.  Location of patient: home Location of provider: [facility] Others present for call: son  Patient reports she has had pain in her lower left thigh for 4 days.  She states it is worsening.  There is nothing that improves it.  She has had a history of bladder infection.  She does not remember ever having diverticulitis.  She does have chronic diarrhea.  She states that it has been fairly well controlled in recent days.  She states that her urine is dark.  She is not experiencing any urge, frequency, nocturia.    ROS: Per HPI  No Known Allergies Past Medical History:  Diagnosis Date  . Arthritis   . Dementia ([place])   . Depression   . Diabetes mellitus without complication ([place])   . GERD (gastroesophageal reflux disease)   . Glaucoma   . Hx of adenomatous colonic polyps   . Hyperlipidemia   . Hypertension   . Obesity     Current Outpatient Medications:  .  ACCU-CHEK AVIVA PLUS test strip, CHECK BLOOD SUGAR TWICE DAILY, Disp: 200 each, Rfl: 3 .  ACCU-CHEK SOFTCLIX LANCETS lancets, TEST BLOOD SUGAR TWICE DAILY, Disp: 200 each, Rfl: 2 .  Blood Glucose Monitoring Suppl (ACCU-CHEK AVIVA PLUS) W/DEVICE KIT, Use to check sugar bid. DX E11.9, Disp: 1 kit, Rfl: 0 .  calcium carbonate (OS-CAL) 600 MG TABS tablet, Take 1 tablet (600 mg total) by mouth 2 (two) times daily with a meal., Disp: 180 tablet, Rfl: 3 .  citalopram (CELEXA) 40 MG tablet, Take 1 tablet (40 mg total) by mouth daily. (Needs to be seen in November), Disp: 90 tablet, Rfl: 3 .   donepezil (ARICEPT) 10 MG tablet, Take 1 tablet (10 mg total) by mouth at bedtime. (Needs to be seen in November), Disp: 90 tablet, Rfl: 3 .  furosemide (LASIX) 40 MG tablet, Take 1 tablet (40 mg total) by mouth daily., Disp: 90 tablet, Rfl: 3 .  glipiZIDE (GLUCOTROL) 10 MG tablet, Take 1 tablet (10 mg total) by mouth 2 (two) times daily., Disp: 180 tablet, Rfl: 3 .  losartan (COZAAR) 50 MG tablet, Take 1 tablet (50 mg total) by mouth daily., Disp: 90 tablet, Rfl: 3 .  lovastatin (MEVACOR) 20 MG tablet, Take 1 tablet (20 mg total) by mouth daily., Disp: 90 tablet, Rfl: 3 .  Melatonin 10 MG CAPS, Take 10 mg by mouth at bedtime., Disp: 90 capsule, Rfl: 3 .  metFORMIN (GLUCOPHAGE) 500 MG tablet, TAKE 1 TABLET EVERY MORNING  AND TAKE 2 TABLETS EVERY EVENING, Disp: 270 tablet, Rfl: 3 .  omeprazole (PRILOSEC) 40 MG capsule, Take 1 capsule (40 mg total) by mouth daily., Disp: 90 capsule, Rfl: 3 .  Probiotic Product (PROBIOTIC DAILY PO), Take by mouth., Disp: , Rfl:  .  traZODone (DESYREL) 50 MG tablet, TAKE 1/2 TO 1 TABLET AT BEDTIME AS NEEDED FOR SLEEP., Disp: 90 tablet, Rfl: 0 .  ciprofloxacin (CIPRO) 250 MG tablet, Take 1 tablet (250 mg total) by mouth 2 (two) times daily., Disp: 14 tablet,  Rfl: 0  Assessment/ Plan: 78 y.o. female   1. Acute cystitis without hematuria - ciprofloxacin (CIPRO) 250 MG tablet; Take 1 tablet (250 mg total) by mouth 2 (two) times daily.  Dispense: 14 tablet; Refill: 0   Start time: 10:35 AM End time: 10:42 AM  Meds ordered this encounter  Medications  . ciprofloxacin (CIPRO) 250 MG tablet    Sig: Take 1 tablet (250 mg total) by mouth 2 (two) times daily.    Dispense:  14 tablet    Refill:  0    Order Specific Question:   Supervising Provider    Answer:   Janora Norlander [[id number]]    Particia Nearing PA-C [place] [phone]

## 2018-06-16 ENCOUNTER — Other Ambulatory Visit: Payer: Self-pay

## 2018-06-17 ENCOUNTER — Ambulatory Visit (INDEPENDENT_AMBULATORY_CARE_PROVIDER_SITE_OTHER): Payer: Medicare HMO | Admitting: Family Medicine

## 2018-06-17 ENCOUNTER — Encounter: Payer: Self-pay | Admitting: Family Medicine

## 2018-06-17 VITALS — BP 117/74 | HR 82 | Temp 98.1°F | Ht 64.0 in | Wt 179.2 lb

## 2018-06-17 DIAGNOSIS — E119 Type 2 diabetes mellitus without complications: Secondary | ICD-10-CM

## 2018-06-17 DIAGNOSIS — E1169 Type 2 diabetes mellitus with other specified complication: Secondary | ICD-10-CM

## 2018-06-17 DIAGNOSIS — K219 Gastro-esophageal reflux disease without esophagitis: Secondary | ICD-10-CM | POA: Diagnosis not present

## 2018-06-17 DIAGNOSIS — E1159 Type 2 diabetes mellitus with other circulatory complications: Secondary | ICD-10-CM | POA: Diagnosis not present

## 2018-06-17 DIAGNOSIS — I152 Hypertension secondary to endocrine disorders: Secondary | ICD-10-CM

## 2018-06-17 DIAGNOSIS — I10 Essential (primary) hypertension: Secondary | ICD-10-CM

## 2018-06-17 DIAGNOSIS — E785 Hyperlipidemia, unspecified: Secondary | ICD-10-CM

## 2018-06-17 DIAGNOSIS — F339 Major depressive disorder, recurrent, unspecified: Secondary | ICD-10-CM | POA: Diagnosis not present

## 2018-06-17 LAB — BAYER DCA HB A1C WAIVED: HB A1C (BAYER DCA - WAIVED): 6.6 % (ref <7.0)

## 2018-06-17 LAB — LIPID PANEL

## 2018-06-17 MED ORDER — TRAZODONE HCL 100 MG PO TABS: 100.0000 mg | ORAL_TABLET | Freq: Every evening | ORAL | 3 refills | Status: DC | PRN

## 2018-06-17 NOTE — Progress Notes (Signed)
BP 117/74   Pulse 82   Temp 98.1 F (36.7 C) (Oral)   Ht '5\' 4"'$  (1.626 m)   Wt 179 lb 3.2 oz (81.3 kg)   BMI 30.76 kg/m    Subjective:   Patient ID: Linda Fletcher, female    DOB: 1940/06/08, 78 y.o.   MRN: [id number]  HPI: Linda Fletcher is a 78 y.o. female presenting on 06/17/2018 for Diabetes; Hypertension; and trouble sleeping (Patient states it has been going on for years and it has been getting worse)   HPI Hypertension Patient is currently on losartan, and their blood pressure today is 117/74. Patient denies any lightheadedness or dizziness. Patient denies headaches, blurred vision, chest pains, shortness of breath, or weakness. Denies any side effects from medication and is content with current medication.   Type 2 diabetes mellitus Patient comes in today for recheck of his diabetes. Patient has been currently taking glipizide and metformin, she says her blood sugars had been in the 270s and 300s but then all of a sudden this morning is 124 like she had been previously, she does not know why it is been up but she has not been getting out with the coronavirus as much so she has been more sedentary and at home.. Patient is currently on an ACE inhibitor/ARB. Patient has not seen an ophthalmologist this year. Patient denies any issues with their feet.   GERD Patient is currently on omeprazole.  She denies any major symptoms or abdominal pain or belching or burping. She denies any blood in her stool or lightheadedness or dizziness.   Hyperlipidemia Patient is coming in for recheck of his hyperlipidemia. The patient is currently taking lovastatin. They deny any issues with myalgias or history of liver damage from it. They deny any focal numbness or weakness or chest pain.   Depression and insomnia Patient feels like her depression is doing okay but she feels like the insomnia is not, she has been taking trazodone 50 for the.  She just feels like it is not working as well and she is not  able to get to sleep as well at night and does not sleep through the night, wakes up sometimes.  We will try increasing the trazodone and see how it does for her.  She does not have any side effects from it or any other problems with it she just is not sleeping as well as she would like.  Relevant past medical, surgical, family and social history reviewed and updated as indicated. Interim medical history since our last visit reviewed. Allergies and medications reviewed and updated.  Review of Systems  Constitutional: Negative for chills and fever.  Eyes: Negative for visual disturbance.  Respiratory: Negative for chest tightness and shortness of breath.   Cardiovascular: Negative for chest pain and leg swelling.  Musculoskeletal: Negative for back pain and gait problem.  Skin: Negative for rash.  Neurological: Negative for light-headedness and headaches.  Psychiatric/Behavioral: Positive for sleep disturbance. Negative for agitation, behavioral problems, dysphoric mood, self-injury and suicidal ideas. The patient is not nervous/anxious.   All other systems reviewed and are negative.   Per HPI unless specifically indicated above   Allergies as of 06/17/2018   No Known Allergies     Medication List       Accurate as of June 17, 2018  8:25 AM. If you have any questions, ask your nurse or doctor.        STOP taking these medications   ciprofloxacin 250 MG  tablet Commonly known as:  Cipro Stopped by:  Fransisca Kaufmann Dettinger, MD     TAKE these medications   Accu-Chek Aviva Plus test strip Generic drug:  glucose blood CHECK BLOOD SUGAR TWICE DAILY   Accu-Chek Aviva Plus w/Device Kit Use to check sugar bid. DX E11.9   Accu-Chek Softclix Lancets lancets TEST BLOOD SUGAR TWICE DAILY   calcium carbonate 600 MG Tabs tablet Commonly known as:  OS-CAL Take 1 tablet (600 mg total) by mouth 2 (two) times daily with a meal.   citalopram 40 MG tablet Commonly known as:  CELEXA Take 1  tablet (40 mg total) by mouth daily. (Needs to be seen in November)   donepezil 10 MG tablet Commonly known as:  ARICEPT Take 1 tablet (10 mg total) by mouth at bedtime. (Needs to be seen in November)   furosemide 40 MG tablet Commonly known as:  LASIX Take 1 tablet (40 mg total) by mouth daily.   glipiZIDE 10 MG tablet Commonly known as:  GLUCOTROL Take 1 tablet (10 mg total) by mouth 2 (two) times daily.   losartan 50 MG tablet Commonly known as:  COZAAR Take 1 tablet (50 mg total) by mouth daily.   lovastatin 20 MG tablet Commonly known as:  MEVACOR Take 1 tablet (20 mg total) by mouth daily.   Melatonin 10 MG Caps Take 10 mg by mouth at bedtime.   metFORMIN 500 MG tablet Commonly known as:  GLUCOPHAGE TAKE 1 TABLET EVERY MORNING  AND TAKE 2 TABLETS EVERY EVENING   omeprazole 40 MG capsule Commonly known as:  PRILOSEC Take 1 capsule (40 mg total) by mouth daily.   PROBIOTIC DAILY PO Take by mouth.   traZODone 100 MG tablet Commonly known as:  DESYREL Take 1 tablet (100 mg total) by mouth at bedtime as needed for sleep. What changed:    medication strength  See the new instructions. Changed by:  Fransisca Kaufmann Dettinger, MD        Objective:   BP 117/74   Pulse 82   Temp 98.1 F (36.7 C) (Oral)   Ht '5\' 4"'$  (1.626 m)   Wt 179 lb 3.2 oz (81.3 kg)   BMI 30.76 kg/m   Wt Readings from Last 3 Encounters:  06/17/18 179 lb 3.2 oz (81.3 kg)  03/09/18 181 lb (82.1 kg)  12/17/17 184 lb (83.5 kg)    Physical Exam Vitals signs and nursing note reviewed.  Constitutional:      General: She is not in acute distress.    Appearance: She is well-developed. She is not diaphoretic.  Eyes:     Conjunctiva/sclera: Conjunctivae normal.  Cardiovascular:     Rate and Rhythm: Normal rate and regular rhythm.     Heart sounds: Normal heart sounds. No murmur.  Pulmonary:     Effort: Pulmonary effort is normal. No respiratory distress.     Breath sounds: Normal breath sounds.  No wheezing.  Musculoskeletal: Normal range of motion.        General: No tenderness.  Skin:    General: Skin is warm and dry.     Findings: No rash.  Neurological:     Mental Status: She is alert and oriented to person, place, and time.     Coordination: Coordination normal.  Psychiatric:        Behavior: Behavior normal.       Assessment & Plan:   Problem List Items Addressed This Visit      Cardiovascular and Mediastinum  Hypertension associated with diabetes ([place]) - Primary   Relevant Orders   CMP14+EGFR     Digestive   GERD (gastroesophageal reflux disease)     Endocrine   Diabetes mellitus without complication ([place])   Relevant Orders   Bayer DCA Hb A1c Waived   CMP14+EGFR   Hyperlipidemia associated with type 2 diabetes mellitus ([place])   Relevant Orders   Lipid panel     Other   Depression, recurrent ([place])   Relevant Medications   traZODone (DESYREL) 100 MG tablet    Increased trazodone, will have to discuss possible increase in medications, will see where A1c is and see if she has been a few days that she was having high blood sugars or if it is been a consistent problem.  For now continue current medications for diabetes.  Follow up plan: Return in about 3 months (around 09/17/2018), or if symptoms worsen or fail to improve, for Diabetes and hypertension recheck.  Counseling provided for all of the vaccine components Orders Placed This Encounter  Procedures  . Bayer DCA Hb A1c Waived  . CMP14+EGFR  . Lipid panel    Caryl Pina, MD [facility] 06/17/2018, 8:25 AM

## 2018-06-18 LAB — CMP14+EGFR
ALT: 24 IU/L (ref 0–32)
AST: 25 IU/L (ref 0–40)
Albumin/Globulin Ratio: 1.6 (ref 1.2–2.2)
Albumin: 4.2 g/dL (ref 3.7–4.7)
Alkaline Phosphatase: 78 IU/L (ref 39–117)
BUN/Creatinine Ratio: 19 (ref 12–28)
BUN: 23 mg/dL (ref 8–27)
Bilirubin Total: 0.5 mg/dL (ref 0.0–1.2)
CO2: 27 mmol/L (ref 20–29)
Calcium: 9.5 mg/dL (ref 8.7–10.3)
Chloride: 94 mmol/L — ABNORMAL LOW (ref 96–106)
Creatinine, Ser: 1.19 mg/dL — ABNORMAL HIGH (ref 0.57–1.00)
GFR calc Af Amer: 51 mL/min/{1.73_m2} — ABNORMAL LOW (ref >59)
GFR calc non Af Amer: 44 mL/min/{1.73_m2} — ABNORMAL LOW (ref >59)
Globulin, Total: 2.6 g/dL (ref 1.5–4.5)
Glucose: 192 mg/dL — ABNORMAL HIGH (ref 65–99)
Potassium: 3.4 mmol/L — ABNORMAL LOW (ref 3.5–5.2)
Sodium: 138 mmol/L (ref 134–144)
Total Protein: 6.8 g/dL (ref 6.0–8.5)

## 2018-06-18 LAB — LIPID PANEL
Chol/HDL Ratio: 3.8 ratio (ref 0.0–4.4)
Cholesterol, Total: 161 mg/dL (ref 100–199)
HDL: 42 mg/dL (ref >39)
LDL Calculated: 82 mg/dL (ref 0–99)
Triglycerides: 186 mg/dL — ABNORMAL HIGH (ref 0–149)
VLDL Cholesterol Cal: 37 mg/dL (ref 5–40)

## 2018-07-21 DIAGNOSIS — R1310 Dysphagia, unspecified: Secondary | ICD-10-CM | POA: Diagnosis not present

## 2018-07-21 DIAGNOSIS — Z1159 Encounter for screening for other viral diseases: Secondary | ICD-10-CM | POA: Diagnosis not present

## 2018-07-21 DIAGNOSIS — K529 Noninfective gastroenteritis and colitis, unspecified: Secondary | ICD-10-CM | POA: Diagnosis not present

## 2018-07-21 DIAGNOSIS — Z86010 Personal history of colonic polyps: Secondary | ICD-10-CM | POA: Diagnosis not present

## 2018-08-03 ENCOUNTER — Telehealth: Payer: Self-pay | Admitting: Family Medicine

## 2018-08-03 NOTE — Chronic Care Management (AMB) (Signed)
Chronic Care Management   Note  08/03/2018 Name: Rozann Holts MRN: [id number] DOB: 1940/12/26  [name] is a 79 y.o. year old female who is a primary care patient of Dettinger, Fransisca Kaufmann, MD. I reached out to Lonn [place] by phone today in response to a referral sent by Ms. Odie S[employer] health plan.    [name] was given information about Chronic Care Management services today including:  1. CCM service includes personalized support from designated clinical staff supervised by her physician, including individualized plan of care and coordination with other care providers 2. 24/7 contact phone numbers for assistance for urgent and routine care needs. 3. Service will only be billed when office clinical staff spend 20 minutes or more in a month to coordinate care. 4. Only one practitioner may furnish and bill the service in a calendar month. 5. The patient may stop CCM services at any time (effective at the end of the month) by phone call to the office staff. 6. The patient will be responsible for cost sharing (co-pay) of up to 20% of the service fee (after annual deductible is met).  Patients son Iona Beard agreed to services and verbal consent obtained.   Follow up plan: Telephone appointment with CCM team member scheduled for: 08/29/2018  [place]  ??[email or web address]   ??[phone]

## 2018-08-15 ENCOUNTER — Ambulatory Visit (INDEPENDENT_AMBULATORY_CARE_PROVIDER_SITE_OTHER): Payer: Medicare HMO | Admitting: *Deleted

## 2018-08-15 DIAGNOSIS — F339 Major depressive disorder, recurrent, unspecified: Secondary | ICD-10-CM

## 2018-08-15 DIAGNOSIS — F0280 Dementia in other diseases classified elsewhere without behavioral disturbance: Secondary | ICD-10-CM | POA: Diagnosis not present

## 2018-08-15 DIAGNOSIS — I10 Essential (primary) hypertension: Secondary | ICD-10-CM | POA: Diagnosis not present

## 2018-08-15 DIAGNOSIS — E1159 Type 2 diabetes mellitus with other circulatory complications: Secondary | ICD-10-CM | POA: Diagnosis not present

## 2018-08-15 DIAGNOSIS — E119 Type 2 diabetes mellitus without complications: Secondary | ICD-10-CM | POA: Diagnosis not present

## 2018-08-15 DIAGNOSIS — I152 Hypertension secondary to endocrine disorders: Secondary | ICD-10-CM

## 2018-08-15 DIAGNOSIS — G301 Alzheimer's disease with late onset: Secondary | ICD-10-CM

## 2018-08-15 NOTE — Chronic Care Management (AMB) (Signed)
Chronic Care Management   Initial Visit Note  08/15/2018 Name: Linda Fletcher MRN: [id number] DOB: March 08, 1940  Referred by: Dettinger, Fransisca Kaufmann, MD Reason for referral : Chronic Care Management (RNCM Initial Visit)   Linda Fletcher is a 78 y.o. year old female who is a primary care patient of Dettinger, Fransisca Kaufmann, MD. The CCM team was consulted for assistance with chronic disease management and care coordination needs.   Review of patient status, including review of consultants reports, relevant laboratory and other test results, and collaboration with appropriate care team members and the patient's provider was performed as part of comprehensive patient evaluation and provision of chronic care management services.    SDOH (Social Determinants of Health) screening performed today. See Care Plan Entry related to challenges with: Stress  Subjective: I spoke with Linda Fletcher by telephone today for her Initial CCM Visit. She lives with her son and states that he is available for help when needed. She is checking her blood sugar twice a day and does not check her blood pressure regularly. She does not have any difficulty with paying for her medication and does not have any problem with access to food, transportation, or adequate housing. She does have some stress and is somewhat isolated. She is ok with the social distancing though and states it isn't much different than her life before Covid-19. She reports having fallen several times this year and that she did break some ribs with a fall that she had last year.   Objective: Lab Results  Component Value Date   HGBA1C 6.6 06/17/2018   HGBA1C 6.7 12/17/2017   HGBA1C 6.2 03/24/2017   Lab Results  Component Value Date   MICROALBUR 20 09/25/2014   LDLCALC 82 06/17/2018   CREATININE 1.19 (H) 06/17/2018   Fall Risk  08/15/2018  Falls in the past year? 1  Number falls in past yr: 1  Injury with Fall? 0  Comment Hx of fall last year with rib fracture   Risk for fall due to : Impaired balance/gait;Medication side effect;History of fall(s);Mental status change  Risk for fall due to: Comment dementia  Follow up Falls prevention discussed;Falls evaluation completed;Education provided  Comment written education to be mailed     Assessment: Goals Addressed      Patient Stated   . "I don't want to fall again" (pt-stated)        Current Barriers:  . Chronic Disease Management support and education needs related to fall prevention  Nurse Case Manager Clinical Goal(s):  Marland Kitchen Over the next 30 days, patient will verbalize understanding of plan for falls prevention . Over the next 60 days, patient will demonstrate improved health management independence as evidenced byno new falls.  Interventions:  . Reviewed medications with patient and discussed furosemide, losartan, and trazadone . Discussed plans with patient for ongoing care management follow up and provided patient with direct contact information for care management team . Provided patient with verbal and written educational materials related to falls prevention in the home. EMMI resources to be mailed to patient.   Patient Self Care Activities:  . Able to perform most ADLs independently but requires assistance with IADLs due to dementia  Initial goal documentation        Plan:  The care management team will reach out to the patient again over the next 30 days.   Contact information for CCM team provided  [name] BSN, RN-BC [place] [facility] [place]  Management Direct Dial: [phone]

## 2018-08-15 NOTE — Patient Instructions (Signed)
Visit Information  Goals Addressed            This Visit's Progress     Patient Stated    "I don't want to fall again" (pt-stated)        Current Barriers:   Chronic Disease Management support and education needs related to fall prevention  Nurse Case Manager Clinical Goal(s):   Over the next 30 days, patient will verbalize understanding of plan for falls prevention  Over the next 60 days, patient will demonstrate improved health management independence as evidenced byno new falls.  Interventions:   Reviewed medications with patient and discussed furosemide, losartan, and trazadone  Discussed plans with patient for ongoing care management follow up and provided patient with direct contact information for care management team  Provided patient with verbal and written educational materials related to falls prevention in the home. EMMI resources to be mailed to patient.   Patient Self Care Activities:   Able to perform most ADLs independently but requires assistance with IADLs due to dementia  Initial goal documentation        Fall Prevention in the Home, Adult Falls can cause injuries. They can happen to people of all ages. There are many things you can do to make your home safe and to help prevent falls. Ask for help when making these changes, if needed. What actions can I take to prevent falls? General Instructions  Use good lighting in all rooms. Replace any light bulbs that burn out.  Turn on the lights when you go into a dark area. Use night-lights.  Keep items that you use often in easy-to-reach places. Lower the shelves around your home if necessary.  Set up your furniture so you have a clear path. Avoid moving your furniture around.  Do not have throw rugs and other things on the floor that can make you trip.  Avoid walking on wet floors.  If any of your floors are uneven, fix them.  Add color or contrast paint or tape to clearly mark and help you  see: ? Any grab bars or handrails. ? First and last steps of stairways. ? Where the edge of each step is.  If you use a stepladder: ? Make sure that it is fully opened. Do not climb a closed stepladder. ? Make sure that both sides of the stepladder are locked into place. ? Ask someone to hold the stepladder for you while you use it.  If there are any pets around you, be aware of where they are. What can I do in the bathroom?      Keep the floor dry. Clean up any water that spills onto the floor as soon as it happens.  Remove soap buildup in the tub or shower regularly.  Use non-skid mats or decals on the floor of the tub or shower.  Attach bath mats securely with double-sided, non-slip rug tape.  If you need to sit down in the shower, use a plastic, non-slip stool.  Install grab bars by the toilet and in the tub and shower. Do not use towel bars as grab bars. What can I do in the bedroom?  Make sure that you have a light by your bed that is easy to reach.  Do not use any sheets or blankets that are too big for your bed. They should not hang down onto the floor.  Have a firm chair that has side arms. You can use this for support while you get dressed.  What can I do in the kitchen? °· Clean up any spills right away. °· If you need to reach something above you, use a strong step stool that has a grab bar. °· Keep electrical cords out of the way. °· Do not use floor polish or wax that makes floors slippery. If you must use wax, use non-skid floor wax. °What can I do with my stairs? °· Do not leave any items on the stairs. °· Make sure that you have a light switch at the top of the stairs and the bottom of the stairs. If you do not have them, ask someone to add them for you. °· Make sure that there are handrails on both sides of the stairs, and use them. Fix handrails that are broken or loose. Make sure that handrails are as long as the stairways. °· Install non-slip stair treads on all  stairs in your home. °· Avoid having throw rugs at the top or bottom of the stairs. If you do have throw rugs, attach them to the floor with carpet tape. °· Choose a carpet that does not hide the edge of the steps on the stairway. °· Check any carpeting to make sure that it is firmly attached to the stairs. Fix any carpet that is loose or worn. °What can I do on the outside of my home? °· Use bright outdoor lighting. °· Regularly fix the edges of walkways and driveways and fix any cracks. °· Remove anything that might make you trip as you walk through a door, such as a raised step or threshold. °· Trim any bushes or trees on the path to your home. °· Regularly check to see if handrails are loose or broken. Make sure that both sides of any steps have handrails. °· Install guardrails along the edges of any raised decks and porches. °· Clear walking paths of anything that might make someone trip, such as tools or rocks. °· Have any leaves, snow, or ice cleared regularly. °· Use sand or salt on walking paths during winter. °· Clean up any spills in your garage right away. This includes grease or oil spills. °What other actions can I take? °· Wear shoes that: °? Have a low heel. Do not wear high heels. °? Have rubber bottoms. °? Are comfortable and fit you well. °? Are closed at the toe. Do not wear open-toe sandals. °· Use tools that help you move around (mobility aids) if they are needed. These include: °? Canes. °? Walkers. °? Scooters. °? Crutches. °· Review your medicines with your doctor. Some medicines can make you feel dizzy. This can increase your chance of falling. °Ask your doctor what other things you can do to help prevent falls. °Where to find more information °· Centers for Disease Control and Prevention, STEAD[employer]: https://cdc.gov °· National Institute on Aging: https://go4life.nia.nih.gov °Contact a doctor if: °· You are afraid of falling at home. °· You feel weak, drowsy, or dizzy at home. °· You fall at  home. °Summary °· There are many simple things that you can do to make your home safe and to help prevent falls. °· Ways to make your home safe include removing tripping hazards and installing grab bars in the bathroom. °· Ask for help when making these changes in your home. °This information is not intended to replace advice given to you by your health care provider. Make sure you discuss any questions you have with your health care provider. °Document Released: 10/25/2008 Document Revised: 04/21/2018   Document Reviewed: 08/13/2016 °Elsevier Patient Education © 2020 [employer]vier Inc. ° ° °Ms. Deguia was given information about Chronic Care Management services today including:  °1. CCM service includes personalized support from designated clinical staff supervised by her physician, including individualized plan of care and coordination with other care providers °2. 24/7 contact phone numbers for assistance for urgent and routine care needs. °3. Service will only be billed when office clinical staff spend 20 minutes or more in a month to coordinate care. °4. Only one practitioner may furnish and bill the service in a calendar month. °5. The patient may stop CCM services at any time (effective at the end of the month) by phone call to the office staff. °6. The patient will be responsible for cost sharing (co-pay) of up to 20% of the service fee (after annual deductible is met). ° °Patient agreed to services and verbal consent obtained.  ° °Print copy of patient instructions provided.  ° °The care management team will reach out to the patient again over the next 30 days.  ° °  BSN, RN-BC °Embedded Chronic Care Manager °[facility] °Direct Dial: [phone]  ° ° ° °

## 2018-08-17 DIAGNOSIS — R1310 Dysphagia, unspecified: Secondary | ICD-10-CM | POA: Diagnosis not present

## 2018-08-17 DIAGNOSIS — R197 Diarrhea, unspecified: Secondary | ICD-10-CM | POA: Diagnosis not present

## 2018-08-17 DIAGNOSIS — E119 Type 2 diabetes mellitus without complications: Secondary | ICD-10-CM | POA: Diagnosis not present

## 2018-08-17 DIAGNOSIS — Z01818 Encounter for other preprocedural examination: Secondary | ICD-10-CM | POA: Diagnosis not present

## 2018-08-17 DIAGNOSIS — K635 Polyp of colon: Secondary | ICD-10-CM | POA: Diagnosis not present

## 2018-08-17 DIAGNOSIS — Z86010 Personal history of colonic polyps: Secondary | ICD-10-CM | POA: Diagnosis not present

## 2018-08-17 DIAGNOSIS — R130 Aphagia: Secondary | ICD-10-CM | POA: Diagnosis not present

## 2018-08-22 DIAGNOSIS — K529 Noninfective gastroenteritis and colitis, unspecified: Secondary | ICD-10-CM | POA: Diagnosis not present

## 2018-08-23 DIAGNOSIS — K200 Eosinophilic esophagitis: Secondary | ICD-10-CM | POA: Diagnosis not present

## 2018-08-29 ENCOUNTER — Telehealth: Payer: Medicare HMO

## 2018-09-07 DIAGNOSIS — K449 Diaphragmatic hernia without obstruction or gangrene: Secondary | ICD-10-CM | POA: Diagnosis not present

## 2018-09-07 DIAGNOSIS — K222 Esophageal obstruction: Secondary | ICD-10-CM | POA: Diagnosis not present

## 2018-09-07 DIAGNOSIS — D369 Benign neoplasm, unspecified site: Secondary | ICD-10-CM | POA: Diagnosis not present

## 2018-09-07 DIAGNOSIS — R197 Diarrhea, unspecified: Secondary | ICD-10-CM | POA: Diagnosis not present

## 2018-09-08 ENCOUNTER — Encounter (HOSPITAL_COMMUNITY): Payer: Self-pay

## 2018-09-08 ENCOUNTER — Emergency Department (HOSPITAL_COMMUNITY)
Admission: EM | Admit: 2018-09-08 | Discharge: 2018-09-12 | Disposition: A | Discharge status: Home / Self Care | Payer: Medicare HMO | Attending: Emergency Medicine | Admitting: Emergency Medicine

## 2018-09-08 ENCOUNTER — Telehealth: Payer: Self-pay | Admitting: Family Medicine

## 2018-09-08 ENCOUNTER — Other Ambulatory Visit: Payer: Self-pay

## 2018-09-08 DIAGNOSIS — Z7984 Long term (current) use of oral hypoglycemic drugs: Secondary | ICD-10-CM | POA: Diagnosis not present

## 2018-09-08 DIAGNOSIS — F332 Major depressive disorder, recurrent severe without psychotic features: Secondary | ICD-10-CM | POA: Diagnosis not present

## 2018-09-08 DIAGNOSIS — Z20828 Contact with and (suspected) exposure to other viral communicable diseases: Secondary | ICD-10-CM | POA: Insufficient documentation

## 2018-09-08 DIAGNOSIS — R58 Hemorrhage, not elsewhere classified: Secondary | ICD-10-CM | POA: Diagnosis not present

## 2018-09-08 DIAGNOSIS — R45851 Suicidal ideations: Secondary | ICD-10-CM | POA: Insufficient documentation

## 2018-09-08 DIAGNOSIS — Z79899 Other long term (current) drug therapy: Secondary | ICD-10-CM | POA: Insufficient documentation

## 2018-09-08 DIAGNOSIS — R456 Violent behavior: Secondary | ICD-10-CM | POA: Diagnosis not present

## 2018-09-08 DIAGNOSIS — Y92009 Unspecified place in unspecified non-institutional (private) residence as the place of occurrence of the external cause: Secondary | ICD-10-CM | POA: Insufficient documentation

## 2018-09-08 DIAGNOSIS — Z23 Encounter for immunization: Secondary | ICD-10-CM | POA: Insufficient documentation

## 2018-09-08 DIAGNOSIS — Z87891 Personal history of nicotine dependence: Secondary | ICD-10-CM | POA: Diagnosis not present

## 2018-09-08 DIAGNOSIS — F329 Major depressive disorder, single episode, unspecified: Secondary | ICD-10-CM

## 2018-09-08 DIAGNOSIS — X781XXA Intentional self-harm by knife, initial encounter: Secondary | ICD-10-CM | POA: Diagnosis not present

## 2018-09-08 DIAGNOSIS — Y9389 Activity, other specified: Secondary | ICD-10-CM | POA: Diagnosis not present

## 2018-09-08 DIAGNOSIS — E119 Type 2 diabetes mellitus without complications: Secondary | ICD-10-CM | POA: Diagnosis not present

## 2018-09-08 DIAGNOSIS — Z03818 Encounter for observation for suspected exposure to other biological agents ruled out: Secondary | ICD-10-CM | POA: Diagnosis not present

## 2018-09-08 DIAGNOSIS — Z008 Encounter for other general examination: Secondary | ICD-10-CM | POA: Insufficient documentation

## 2018-09-08 DIAGNOSIS — S81812A Laceration without foreign body, left lower leg, initial encounter: Secondary | ICD-10-CM | POA: Diagnosis not present

## 2018-09-08 DIAGNOSIS — S81811A Laceration without foreign body, right lower leg, initial encounter: Secondary | ICD-10-CM | POA: Diagnosis not present

## 2018-09-08 DIAGNOSIS — I10 Essential (primary) hypertension: Secondary | ICD-10-CM | POA: Insufficient documentation

## 2018-09-08 DIAGNOSIS — Y999 Unspecified external cause status: Secondary | ICD-10-CM | POA: Insufficient documentation

## 2018-09-08 DIAGNOSIS — S61512A Laceration without foreign body of left wrist, initial encounter: Secondary | ICD-10-CM | POA: Insufficient documentation

## 2018-09-08 DIAGNOSIS — T1491XA Suicide attempt, initial encounter: Secondary | ICD-10-CM | POA: Diagnosis not present

## 2018-09-08 DIAGNOSIS — F0391 Unspecified dementia with behavioral disturbance: Secondary | ICD-10-CM | POA: Insufficient documentation

## 2018-09-08 DIAGNOSIS — F32A Depression, unspecified: Secondary | ICD-10-CM

## 2018-09-08 DIAGNOSIS — R0902 Hypoxemia: Secondary | ICD-10-CM | POA: Diagnosis not present

## 2018-09-08 LAB — COMPREHENSIVE METABOLIC PANEL
ALT: 20 U/L (ref 0–44)
AST: 22 U/L (ref 15–41)
Albumin: 3.7 g/dL (ref 3.5–5.0)
Alkaline Phosphatase: 70 U/L (ref 38–126)
Anion gap: 13 (ref 5–15)
BUN: 19 mg/dL (ref 8–23)
CO2: 28 mmol/L (ref 22–32)
Calcium: 9.2 mg/dL (ref 8.9–10.3)
Chloride: 103 mmol/L (ref 98–111)
Creatinine, Ser: 1.12 mg/dL — ABNORMAL HIGH (ref 0.44–1.00)
GFR calc Af Amer: 54 mL/min — ABNORMAL LOW (ref >60)
GFR calc non Af Amer: 47 mL/min — ABNORMAL LOW (ref >60)
Glucose, Bld: 158 mg/dL — ABNORMAL HIGH (ref 70–99)
Potassium: 3.5 mmol/L (ref 3.5–5.1)
Sodium: 144 mmol/L (ref 135–145)
Total Bilirubin: 0.4 mg/dL (ref 0.3–1.2)
Total Protein: 7.5 g/dL (ref 6.5–8.1)

## 2018-09-08 LAB — URINALYSIS, ROUTINE W REFLEX MICROSCOPIC
Bilirubin Urine: NEGATIVE
Glucose, UA: NEGATIVE mg/dL
Hgb urine dipstick: NEGATIVE
Ketones, ur: NEGATIVE mg/dL
Leukocytes,Ua: NEGATIVE
Nitrite: NEGATIVE
Protein, ur: NEGATIVE mg/dL
Specific Gravity, Urine: 1.01 (ref 1.005–1.030)
pH: 5 (ref 5.0–8.0)

## 2018-09-08 LAB — RAPID URINE DRUG SCREEN, HOSP PERFORMED
Amphetamines: NOT DETECTED
Barbiturates: NOT DETECTED
Benzodiazepines: NOT DETECTED
Cocaine: NOT DETECTED
Opiates: NOT DETECTED
Tetrahydrocannabinol: NOT DETECTED

## 2018-09-08 LAB — ETHANOL: Alcohol, Ethyl (B): <10 mg/dL (ref <10)

## 2018-09-08 MED ORDER — FUROSEMIDE 40 MG PO TABS
40.0000 mg | ORAL_TABLET | Freq: Every day | ORAL | Status: DC
  Administered 2018-09-09 – 2018-09-12 (×4): 40 mg via ORAL
  Filled 2018-09-08 (×4): qty 1

## 2018-09-08 MED ORDER — TETANUS-DIPHTH-ACELL PERTUSSIS 5-2.5-18.5 LF-MCG/0.5 IM SUSP
0.5000 mL | Freq: Once | INTRAMUSCULAR | Status: AC
  Administered 2018-09-08: 0.5 mL via INTRAMUSCULAR
  Filled 2018-09-08: qty 0.5

## 2018-09-08 MED ORDER — CALCIUM CARBONATE 1250 (500 CA) MG PO TABS
600.0000 mg | ORAL_TABLET | Freq: Two times a day (BID) | ORAL | Status: DC
  Filled 2018-09-08 (×7): qty 1

## 2018-09-08 MED ORDER — PRAVASTATIN SODIUM 20 MG PO TABS
20.0000 mg | ORAL_TABLET | Freq: Every day | ORAL | Status: DC
  Administered 2018-09-09 – 2018-09-12 (×3): 20 mg via ORAL
  Filled 2018-09-08 (×5): qty 1

## 2018-09-08 MED ORDER — CITALOPRAM HYDROBROMIDE 20 MG PO TABS
40.0000 mg | ORAL_TABLET | Freq: Every day | ORAL | Status: DC
  Administered 2018-09-09 – 2018-09-12 (×4): 40 mg via ORAL
  Filled 2018-09-08: qty 1
  Filled 2018-09-08: qty 2
  Filled 2018-09-08 (×2): qty 1
  Filled 2018-09-08: qty 2
  Filled 2018-09-08: qty 1

## 2018-09-08 MED ORDER — LOSARTAN POTASSIUM 25 MG PO TABS
50.0000 mg | ORAL_TABLET | Freq: Every day | ORAL | Status: DC
  Administered 2018-09-09 – 2018-09-12 (×4): 50 mg via ORAL
  Filled 2018-09-08 (×4): qty 2

## 2018-09-08 MED ORDER — GLIPIZIDE 10 MG PO TABS
10.0000 mg | ORAL_TABLET | Freq: Two times a day (BID) | ORAL | Status: DC
  Administered 2018-09-09 – 2018-09-12 (×9): 10 mg via ORAL
  Filled 2018-09-08 (×11): qty 1
  Filled 2018-09-08: qty 2
  Filled 2018-09-08: qty 1

## 2018-09-08 MED ORDER — METFORMIN HCL 500 MG PO TABS
500.0000 mg | ORAL_TABLET | Freq: Two times a day (BID) | ORAL | Status: DC
  Administered 2018-09-09 – 2018-09-12 (×8): 500 mg via ORAL
  Filled 2018-09-08 (×8): qty 1

## 2018-09-08 MED ORDER — DONEPEZIL HCL 10 MG PO TABS
10.0000 mg | ORAL_TABLET | Freq: Every day | ORAL | Status: DC
  Administered 2018-09-09 – 2018-09-12 (×5): 10 mg via ORAL
  Filled 2018-09-08 (×3): qty 1
  Filled 2018-09-08: qty 2
  Filled 2018-09-08 (×3): qty 1

## 2018-09-08 NOTE — Progress Notes (Signed)
Pt meets inpatient criteria per Mordecai Maes, NP. Referral information has been sent to the following hospitals for review:  [facility]  [facility]      Disposition will continue to assist with inpatient placement needs.   Audree Camel, LCSW, [place] Disposition [facility] [phone]

## 2018-09-08 NOTE — ED Notes (Signed)
Patient refusing to cooperate with assessment. States "I am not crazy and you're trying to tell me I am crazy" When attempting the suicide risk shift assessment.
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
## 2018-09-08 NOTE — ED Notes (Signed)
ED Provider at bedside. 

## 2018-09-08 NOTE — ED Provider Notes (Addendum)
[facility] EMERGENCY DEPARTMENT Provider Note   CSN: [id number] Arrival date & time: 09/08/18  1514     History   Chief Complaint Chief Complaint  Patient presents with  . V70.1    HPI Linda Fletcher is a 78 y.o. female with a history of dementia and depression with multiple suicide attempts over the past several years presenting today after a suicide attempt.  Per son, patient patient states she can no longer live with her daughter or son.  EMS reports that patient was initially combative when found at her house and was restrained by police.  Patient was able to be redirected and is now cooperative with medical staff.   Per son, patient was diagnosed with dementia 1 year ago. Son describes frustrations between his mother and him/her daughter due to her symptoms of dementia.  Patient states that she "acts out in order to get attention."  Son states that today's incident was 1 of several suicide attempts. Son states she has been more anxious recently d/t COVID restrictions and recent death of his dog. Patient's states she announced  "I cut myself" and describes a "puddle of blood" around her upon finding her outside. Patient states previous attempts involved her taking pills. Pt was in [facility]) and then [place] (total she was committed for 1 month).   Diarrhea for 9 months, colonoscopy with polyp removal 2-3 weeks ago.    HPI  Past Medical History:  Diagnosis Date  . Arthritis   . Dementia ([place])   . Depression   . Diabetes mellitus without complication ([place])   . GERD (gastroesophageal reflux disease)   . Glaucoma   . Hx of adenomatous colonic polyps   . Hyperlipidemia   . Hypertension   . Obesity     Patient Active Problem List   Diagnosis Date Noted  . GERD (gastroesophageal reflux disease) 09/26/2015  . Hypertension associated with diabetes ([place]) 09/26/2015  . Depression, recurrent ([place]) 09/26/2015  . Osteoarthritis, shoulder 06/07/2015   . Hyperlipidemia associated with type 2 diabetes mellitus ([place]) 09/25/2014  . Dementia ([place])   . Diabetes mellitus without complication [facility])     Past Surgical History:  Procedure Laterality Date  . ABDOMINAL HYSTERECTOMY    . CHOLECYSTECTOMY    . THYROID CYST EXCISION    . TONSILLECTOMY AND ADENOIDECTOMY     age 77     OB History   No obstetric history on file.      Home Medications    Prior to Admission medications   Medication Sig Start Date End Date Taking? Authorizing Provider  ACCU-CHEK AVIVA PLUS test strip CHECK BLOOD SUGAR TWICE DAILY 10/19/17   Dettinger, Fransisca Kaufmann, MD  ACCU-CHEK SOFTCLIX LANCETS lancets TEST BLOOD SUGAR TWICE DAILY 11/19/15   Wardell Honour, MD  Blood Glucose Monitoring Suppl (ACCU-CHEK AVIVA PLUS) W/DEVICE KIT Use to check sugar bid. DX E11.9 10/19/14   Wardell Honour, MD  calcium carbonate (OS-CAL) 600 MG TABS tablet Take 1 tablet (600 mg total) by mouth 2 (two) times daily with a meal. 12/17/17   Dettinger, Fransisca Kaufmann, MD  citalopram (CELEXA) 40 MG tablet Take 1 tablet (40 mg total) by mouth daily. (Needs to be seen in November) 12/17/17   Dettinger, Fransisca Kaufmann, MD  donepezil (ARICEPT) 10 MG tablet Take 1 tablet (10 mg total) by mouth at bedtime. (Needs to be seen in November) 12/17/17   Dettinger, Fransisca Kaufmann, MD  furosemide (LASIX) 40 MG tablet Take  1 tablet (40 mg total) by mouth daily. 12/17/17   Dettinger, Fransisca Kaufmann, MD  glipiZIDE (GLUCOTROL) 10 MG tablet Take 1 tablet (10 mg total) by mouth 2 (two) times daily. 12/17/17   Dettinger, Fransisca Kaufmann, MD  losartan (COZAAR) 50 MG tablet Take 1 tablet (50 mg total) by mouth daily. 12/17/17   Dettinger, Fransisca Kaufmann, MD  lovastatin (MEVACOR) 20 MG tablet Take 1 tablet (20 mg total) by mouth daily. 12/17/17   Dettinger, Fransisca Kaufmann, MD  Melatonin 10 MG CAPS Take 10 mg by mouth at bedtime. 12/17/17   Dettinger, Fransisca Kaufmann, MD  metFORMIN (GLUCOPHAGE) 500 MG tablet TAKE 1 TABLET EVERY MORNING  AND TAKE 2 TABLETS EVERY EVENING  12/17/17   Dettinger, Fransisca Kaufmann, MD  omeprazole (PRILOSEC) 40 MG capsule Take 1 capsule (40 mg total) by mouth daily. 12/17/17   Dettinger, Fransisca Kaufmann, MD  Probiotic Product (PROBIOTIC DAILY PO) Take by mouth.    [provider]  traZODone (DESYREL) 100 MG tablet Take 1 tablet (100 mg total) by mouth at bedtime as needed for sleep. 06/17/18   Dettinger, Fransisca Kaufmann, MD    Family History Family History  Problem Relation Age of Onset  . Arthritis Mother   . Diabetes Mother   . Diabetes Son   . Hypertension Son   . Hyperlipidemia Son   . Hypertension Daughter     Social History Social History   Tobacco Use  . Smoking status: Former Smoker    Quit date: 07/18/1974    Years since quitting: 44.1  . Smokeless tobacco: Never Used  Substance Use Topics  . Alcohol use: No  . Drug use: No     Allergies   Patient has no known allergies.   Review of Systems Review of Systems  Constitutional: Negative for chills and fever.  HENT: Negative for congestion, hearing loss, sinus pressure and sore throat.   Eyes: Negative for pain and redness.  Respiratory: Negative for cough and shortness of breath.   Cardiovascular: Negative for chest pain and palpitations.  Gastrointestinal: Positive for diarrhea. Negative for nausea and vomiting.  Genitourinary: Negative for dysuria and hematuria.  Musculoskeletal: Negative for arthralgias.  Skin: Negative for rash.  Neurological: Negative for dizziness and headaches.  Psychiatric/Behavioral: Positive for sleep disturbance and suicidal ideas. The patient is nervous/anxious.      Physical Exam Updated Vital Signs BP 137/73 (BP Location: Right Arm)   Pulse 83   Resp 20   SpO2 97%   Physical Exam Constitutional:      General: She is not in acute distress.    Appearance: She is not ill-appearing.  HENT:     Head: Normocephalic and atraumatic.     Right Ear: External ear normal.     Left Ear: External ear normal.     Nose: Nose normal.      Mouth/Throat:     Comments: Teeth absent Eyes:     General:        Right eye: No discharge.        Left eye: No discharge.     Extraocular Movements: Extraocular movements intact.     Conjunctiva/sclera: Conjunctivae normal.     Pupils: Pupils are equal, round, and reactive to light.  Neck:     Musculoskeletal: Normal range of motion.  Cardiovascular:     Rate and Rhythm: Normal rate and regular rhythm.     Heart sounds: No murmur. No friction rub. No gallop.   Pulmonary:  Effort: Pulmonary effort is normal. No respiratory distress.     Breath sounds: No stridor. No wheezing or rhonchi.  Abdominal:     General: Bowel sounds are normal.     Palpations: Abdomen is soft.     Tenderness: There is no abdominal tenderness. There is no guarding.  Musculoskeletal:     Right lower leg: No edema.     Left lower leg: No edema.  Skin:    General: Skin is warm and dry.  Neurological:     Mental Status: She is alert and oriented to person, place, and time. Mental status is at baseline.  Psychiatric:        Mood and Affect: Mood normal.        Behavior: Behavior normal.      ED Treatments / Results  Labs (all labs ordered are listed, but only abnormal results are displayed) Labs Reviewed - No data to display  EKG None  Radiology No results found.  Procedures Procedures (including critical care time)  Medications Ordered in ED Medications - No data to display   Initial Impression / Assessment and Plan / ED Course  I have reviewed the triage vital signs and the nursing notes.  Pertinent labs & imaging results that were available during my care of the patient were reviewed by me and considered in my medical decision making (see chart for details).    Mildly elevated creatinine and glucose with hazy appearing urine suggestive of prerenal AKI.  Home meds reviewed and do not require levels.  Patient requires psychiatric evaluation and precautions at this time.  Psychiatry  consulted.   Per social work, patient meets criteria for inpatient psychiatric treatments.    1609 09/09/2018: Addendum  On physical exam patient had 2 cm superficial laceration to the anterior left shin, similar size and depth laceration to the right calf, and 1 cm superficial laceration to left wrist.  Lower extremity lacerations had surrounding dried blood but were no longer bleeding when evaluated. These lacerations were cleaned and irrigated and left to heal by secondary intent as they were not bleeding.  There are no signs of infection at this time.    Final Clinical Impressions(s) / ED Diagnoses   Final diagnoses:  None    ED Discharge Orders    None       Tedd Sias, [place] 09/09/18 0127    Tedd Sias, [place] 09/09/18 1611    Tedd Sias, [place] 09/09/18 2326    Noemi Chapel, MD 09/09/18 [phone]

## 2018-09-08 NOTE — ED Triage Notes (Signed)
EMS reports pt was on phone with her daughter and got into an argument with her.  Pt cut herself on r leg with plastic knife and told ems pt was suicidal and states, "I don't want to be here anymore."    EMS says pt was combative initially but easily redirected.

## 2018-09-08 NOTE — Telephone Encounter (Signed)
FYI

## 2018-09-09 LAB — SARS CORONAVIRUS 2 BY RT PCR (HOSPITAL ORDER, PERFORMED IN ~~LOC~~ HOSPITAL LAB): SARS Coronavirus 2: NEGATIVE

## 2018-09-09 LAB — CBG MONITORING, ED: Glucose-Capillary: 205 mg/dL — ABNORMAL HIGH (ref 70–99)

## 2018-09-09 MED ORDER — CALCIUM CARBONATE 1250 (500 CA) MG PO TABS
500.0000 mg | ORAL_TABLET | Freq: Two times a day (BID) | ORAL | Status: DC
  Administered 2018-09-09 – 2018-09-12 (×7): 500 mg via ORAL
  Filled 2018-09-09 (×11): qty 1

## 2018-09-09 MED ORDER — LOPERAMIDE HCL 2 MG PO CAPS
4.0000 mg | ORAL_CAPSULE | Freq: Four times a day (QID) | ORAL | Status: DC | PRN
  Administered 2018-09-09 – 2018-09-10 (×2): 4 mg via ORAL
  Filled 2018-09-09 (×2): qty 2

## 2018-09-09 NOTE — ED Notes (Signed)
Pt would like something for diarrhea, states she has had 4 loose stools

## 2018-09-09 NOTE — ED Notes (Signed)
Received report on pt who denies any complaints, dinner tray removed from room, pt ate all of tray,

## 2018-09-09 NOTE — BHH Counselor (Signed)
Reassessment- Pt reports she is not sleeping but is eating OK. Pt reports SI and denies HI/AH/VH. Pt continues to meet inpatient criteria.

## 2018-09-10 NOTE — BH Assessment (Signed)
[place] Assessment Progress Note This Probation officer spoke with patient this date to evaluate current mental health status. Patient voices passive S/I although did not voice intent or a plan. Patient is very animated and is observed to be agitated as she renders her history in reference to her current living situation and relationship with her son. Patient seems to be fixated on her family situation and continues to discuss her issues with her son. Patient continues to say, "I am not crazy he is and needs to be in here, not me." Patient is oriented x 3. Case was staffed with Money NP who recommended continued inpatient monitoring and will be seen again in the a.m.

## 2018-09-10 NOTE — Progress Notes (Signed)
CSW contacted referral facilities with the following results:  Still reviewing:  [facility] (resent per request) [facility] (resent per request) [facility] (no answer) [facility] (voicemail left) [facility] (no answer) [facility] (voicemail)  Declined: [facility] (medical)  Faxed to: [place]  TTS will continue to seek placement.  Chalmers Guest. Guerry Bruin, MSW, [place] Work/Disposition Phone: [phone] Fax: [phone]

## 2018-09-10 NOTE — ED Notes (Signed)
Patient has been very cooperative and able to walk to restroom with standby assistance.

## 2018-09-10 NOTE — ED Notes (Signed)
Pt VS updated. Pt very pleasant and cooperative; resting comfortably at this time & telesitter in place. No concerns voiced.

## 2018-09-10 NOTE — ED Notes (Signed)
Pt resting on side, no distress noted, resp even and non labored, telesitter remains in place,

## 2018-09-10 NOTE — ED Notes (Signed)
TTS consult in progress at this time.

## 2018-09-11 ENCOUNTER — Other Ambulatory Visit: Payer: Self-pay

## 2018-09-11 LAB — CBG MONITORING, ED: Glucose-Capillary: 185 mg/dL — ABNORMAL HIGH (ref 70–99)

## 2018-09-11 NOTE — ED Notes (Signed)
Pt ambulatory to bathroom and back to room 
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
## 2018-09-11 NOTE — Progress Notes (Signed)
CSW contacted referral facilities with the following results:  Still reviewing: [facility] (no answer) [facility] (voicemail left)  Declined: [facility] (medical) [facility] (at capacity) [facility] (at capacity today, can try tomorrow) [facility] (no reason given) [facility] (waitlist) [facility] (at capacity today, try tomorrow) [facility] (at capacity) [place] (dementia) [facility] (at capacity today, try tomorrow) [facility], music (at capacity) [facility] (no female beds currently)  TTS will continue to seek bed placement.  Chalmers Guest. Guerry Bruin, MSW, [place] Work/Disposition Phone: [phone] Fax: [phone]

## 2018-09-12 NOTE — TOC Initial Note (Signed)
Transition of Care [facility]) - Initial/Assessment Note    Patient Details  Name: Linda Fletcher MRN: [id number] Date of Birth: [birth year]  Transition of Care [facility]) CM/SW Contact:    Trish Mage, LCSW Phone Number: 09/12/2018, 3:38 PM  Clinical Narrative:   Patient seen due to psych. Clearance; family feeling overwhelmed at times and wanting help with services.  See comprehensive TTS notes for background and progression while in ED.  Ms Tullo defers to son, and asks me to call him.  He states his mother does not need help with "cooking, cleaning, or shopping," but he feels that she could benefit form medical services. He has no preference of agencies.  Furthermore, he tells me that she is not seeing a pyschiatrist, but her PCP is prescribing her psych meds, "the same ones that have been working for her for the past 20 years."  He declined referral to psychiatrist.  CSW left message for [facility] at [facility] re: referral for [facility] RN, PT, SW and aide services.  If [facility] is not able to accept patient, CSW will follow up with another referral, and let family know.  OK to d/c from [facility] standpoint.              Expected Discharge Plan: [place] Barriers to Discharge: No Barriers Identified   Patient Goals and CMS Choice   CMS Medicare.gov Compare Post Acute Care list provided to:: Other (Comment Required)(Son) Choice offered to / list presented to : Adult Children  Expected Discharge Plan and Services Expected Discharge Plan: [place]   Discharge Planning Services: CM Consult Post Acute Care Choice: [place] arrangements for the past 2 months: Single Family Home                           HH Arranged: RN, PT, Nurse's Aide [place] Agency: [facility] ([facility]) Date [place]: 09/12/18 Time [place]: 50 Representative spoke with at [place]: [place] Arrangements/Services Living arrangements for the  past 2 months: [place] Lives with:: Adult Children Patient language and need for interpreter reviewed:: Yes Do you feel safe going back to the place where you live?: Yes      Need for Family Participation in Patient Care: Yes (Comment) Care giver support system in place?: Yes (comment)   Criminal Activity/Legal Involvement Pertinent to Current Situation/Hospitalization: No - Comment as needed  Activities of Daily Living Home Assistive Devices/Equipment: None ADL Screening (condition at time of admission) Patient's cognitive ability adequate to safely complete daily activities?: Yes Is the patient deaf or have difficulty hearing?: No Does the patient have difficulty seeing, even when wearing glasses/contacts?: No Does the patient have difficulty concentrating, remembering, or making decisions?: No Patient able to express need for assistance with ADLs?: Yes Does the patient have difficulty dressing or bathing?: No Independently performs ADLs?: Yes (appropriate for developmental age) Does the patient have difficulty walking or climbing stairs?: No Weakness of Legs: None Weakness of Arms/Hands: None  Permission Sought/Granted Permission sought to share information with : Family Supports Permission granted to share information with : Yes, Verbal Permission Granted  Share Information with NAME: Mr Dunkerson     Permission granted to share info w Relationship: son  Permission granted to share info w Contact Information: [phone]  Emotional Assessment Appearance:: Appears stated age     Orientation: : Oriented to  Self, Oriented to Place Alcohol / Substance Use: Not Applicable Psych Involvement: No (comment)  Admission diagnosis:  Leg laceration  Patient Active Problem List   Diagnosis Date Noted  . GERD (gastroesophageal reflux disease) 09/26/2015  . Hypertension associated with diabetes ([place]) 09/26/2015  . Depression, recurrent ([place]) 09/26/2015  . Osteoarthritis,  shoulder 06/07/2015  . Hyperlipidemia associated with type 2 diabetes mellitus ([place]) 09/25/2014  . Dementia ([place])   . Diabetes mellitus without complication ([place])    PCP:  Dettinger, Fransisca Kaufmann, MD Pharmacy:   [place], [place] [address] Phone: [phone] Fax: [phone]  [id number] [place], [place] [address] Phone: [phone] Fax: [phone]  [place], [address] [place] [address] Phone: [phone] Fax: [phone]     Social Determinants of Health (SDOH) Interventions    Readmission Risk Interventions No flowsheet data found.

## 2018-09-12 NOTE — ED Notes (Signed)
Pt has been psych cleared with SW consult, per EDP d/c sitter order

## 2018-09-12 NOTE — Discharge Instructions (Addendum)
Behavioral health has cleared patient to go home.  Social work consults have been initiated for a home health visit.

## 2018-09-12 NOTE — ED Provider Notes (Signed)
Behavioral health is cleared patient to go home.  I initiated social work consult for home health care and home evaluation.   Nat Christen, MD 09/12/18 2035
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
## 2018-09-12 NOTE — ED Notes (Signed)
T/c to son, advised pt is ready for discharge, ETA about 1 hour

## 2018-09-17 DIAGNOSIS — E785 Hyperlipidemia, unspecified: Secondary | ICD-10-CM | POA: Diagnosis not present

## 2018-09-17 DIAGNOSIS — I10 Essential (primary) hypertension: Secondary | ICD-10-CM | POA: Diagnosis not present

## 2018-09-17 DIAGNOSIS — Z86010 Personal history of colonic polyps: Secondary | ICD-10-CM | POA: Diagnosis not present

## 2018-09-17 DIAGNOSIS — F329 Major depressive disorder, single episode, unspecified: Secondary | ICD-10-CM | POA: Diagnosis not present

## 2018-09-17 DIAGNOSIS — E119 Type 2 diabetes mellitus without complications: Secondary | ICD-10-CM | POA: Diagnosis not present

## 2018-09-17 DIAGNOSIS — E669 Obesity, unspecified: Secondary | ICD-10-CM | POA: Diagnosis not present

## 2018-09-17 DIAGNOSIS — K219 Gastro-esophageal reflux disease without esophagitis: Secondary | ICD-10-CM | POA: Diagnosis not present

## 2018-09-17 DIAGNOSIS — F0390 Unspecified dementia without behavioral disturbance: Secondary | ICD-10-CM | POA: Diagnosis not present

## 2018-09-17 DIAGNOSIS — Z7984 Long term (current) use of oral hypoglycemic drugs: Secondary | ICD-10-CM | POA: Diagnosis not present

## 2018-09-20 ENCOUNTER — Telehealth: Payer: Self-pay | Admitting: *Deleted

## 2018-09-20 NOTE — Telephone Encounter (Signed)
VM from [place] w/ [facility] Pt & family does not want [facility] nursing or aide, will do SW & PT Pt has had some recent falls In med review w/ pt & family, pt has been taking Glimipiride QD instead of BID

## 2018-09-21 NOTE — Telephone Encounter (Signed)
Okay that is fine, she can continue to take it daily instead of twice a day on the glimepiride.  I think the PT is the most important so I am glad that she is doing that.

## 2018-09-23 ENCOUNTER — Ambulatory Visit (INDEPENDENT_AMBULATORY_CARE_PROVIDER_SITE_OTHER): Payer: Medicare HMO

## 2018-09-23 ENCOUNTER — Other Ambulatory Visit: Payer: Self-pay

## 2018-09-23 DIAGNOSIS — E119 Type 2 diabetes mellitus without complications: Secondary | ICD-10-CM | POA: Diagnosis not present

## 2018-09-23 DIAGNOSIS — Z7984 Long term (current) use of oral hypoglycemic drugs: Secondary | ICD-10-CM

## 2018-09-23 DIAGNOSIS — F0390 Unspecified dementia without behavioral disturbance: Secondary | ICD-10-CM

## 2018-09-23 DIAGNOSIS — Z86010 Personal history of colonic polyps: Secondary | ICD-10-CM | POA: Diagnosis not present

## 2018-09-23 DIAGNOSIS — E785 Hyperlipidemia, unspecified: Secondary | ICD-10-CM | POA: Diagnosis not present

## 2018-09-23 DIAGNOSIS — E669 Obesity, unspecified: Secondary | ICD-10-CM | POA: Diagnosis not present

## 2018-09-23 DIAGNOSIS — Z87891 Personal history of nicotine dependence: Secondary | ICD-10-CM

## 2018-09-23 DIAGNOSIS — F329 Major depressive disorder, single episode, unspecified: Secondary | ICD-10-CM | POA: Diagnosis not present

## 2018-09-23 DIAGNOSIS — K219 Gastro-esophageal reflux disease without esophagitis: Secondary | ICD-10-CM

## 2018-09-23 DIAGNOSIS — I10 Essential (primary) hypertension: Secondary | ICD-10-CM | POA: Diagnosis not present

## 2018-09-28 DIAGNOSIS — F0390 Unspecified dementia without behavioral disturbance: Secondary | ICD-10-CM | POA: Diagnosis not present

## 2018-09-28 DIAGNOSIS — Z86010 Personal history of colonic polyps: Secondary | ICD-10-CM | POA: Diagnosis not present

## 2018-09-28 DIAGNOSIS — E785 Hyperlipidemia, unspecified: Secondary | ICD-10-CM | POA: Diagnosis not present

## 2018-09-28 DIAGNOSIS — I10 Essential (primary) hypertension: Secondary | ICD-10-CM | POA: Diagnosis not present

## 2018-09-28 DIAGNOSIS — K219 Gastro-esophageal reflux disease without esophagitis: Secondary | ICD-10-CM | POA: Diagnosis not present

## 2018-09-28 DIAGNOSIS — E119 Type 2 diabetes mellitus without complications: Secondary | ICD-10-CM | POA: Diagnosis not present

## 2018-09-28 DIAGNOSIS — F329 Major depressive disorder, single episode, unspecified: Secondary | ICD-10-CM | POA: Diagnosis not present

## 2018-09-28 DIAGNOSIS — E669 Obesity, unspecified: Secondary | ICD-10-CM | POA: Diagnosis not present

## 2018-09-28 DIAGNOSIS — Z7984 Long term (current) use of oral hypoglycemic drugs: Secondary | ICD-10-CM | POA: Diagnosis not present

## 2018-09-30 ENCOUNTER — Other Ambulatory Visit: Payer: Self-pay

## 2018-10-01 DIAGNOSIS — K219 Gastro-esophageal reflux disease without esophagitis: Secondary | ICD-10-CM | POA: Diagnosis not present

## 2018-10-01 DIAGNOSIS — E785 Hyperlipidemia, unspecified: Secondary | ICD-10-CM | POA: Diagnosis not present

## 2018-10-01 DIAGNOSIS — E119 Type 2 diabetes mellitus without complications: Secondary | ICD-10-CM | POA: Diagnosis not present

## 2018-10-01 DIAGNOSIS — E669 Obesity, unspecified: Secondary | ICD-10-CM | POA: Diagnosis not present

## 2018-10-01 DIAGNOSIS — Z7984 Long term (current) use of oral hypoglycemic drugs: Secondary | ICD-10-CM | POA: Diagnosis not present

## 2018-10-01 DIAGNOSIS — I10 Essential (primary) hypertension: Secondary | ICD-10-CM | POA: Diagnosis not present

## 2018-10-01 DIAGNOSIS — F0390 Unspecified dementia without behavioral disturbance: Secondary | ICD-10-CM | POA: Diagnosis not present

## 2018-10-01 DIAGNOSIS — F329 Major depressive disorder, single episode, unspecified: Secondary | ICD-10-CM | POA: Diagnosis not present

## 2018-10-01 DIAGNOSIS — Z86010 Personal history of colonic polyps: Secondary | ICD-10-CM | POA: Diagnosis not present

## 2018-10-02 ENCOUNTER — Encounter (HOSPITAL_COMMUNITY): Payer: Self-pay | Admitting: Emergency Medicine

## 2018-10-02 ENCOUNTER — Other Ambulatory Visit: Payer: Self-pay

## 2018-10-02 ENCOUNTER — Emergency Department (HOSPITAL_COMMUNITY)
Admission: EM | Admit: 2018-10-02 | Discharge: 2018-10-03 | Disposition: A | Discharge status: Home / Self Care | Payer: Medicare HMO | Attending: Emergency Medicine | Admitting: Emergency Medicine

## 2018-10-02 ENCOUNTER — Emergency Department (HOSPITAL_COMMUNITY): Payer: Medicare HMO

## 2018-10-02 DIAGNOSIS — W19XXXA Unspecified fall, initial encounter: Secondary | ICD-10-CM | POA: Diagnosis not present

## 2018-10-02 DIAGNOSIS — E119 Type 2 diabetes mellitus without complications: Secondary | ICD-10-CM | POA: Insufficient documentation

## 2018-10-02 DIAGNOSIS — S0003XA Contusion of scalp, initial encounter: Secondary | ICD-10-CM | POA: Diagnosis not present

## 2018-10-02 DIAGNOSIS — Y999 Unspecified external cause status: Secondary | ICD-10-CM | POA: Insufficient documentation

## 2018-10-02 DIAGNOSIS — Z79899 Other long term (current) drug therapy: Secondary | ICD-10-CM | POA: Insufficient documentation

## 2018-10-02 DIAGNOSIS — S199XXA Unspecified injury of neck, initial encounter: Secondary | ICD-10-CM | POA: Diagnosis not present

## 2018-10-02 DIAGNOSIS — Y93E1 Activity, personal bathing and showering: Secondary | ICD-10-CM | POA: Insufficient documentation

## 2018-10-02 DIAGNOSIS — W182XXA Fall in (into) shower or empty bathtub, initial encounter: Secondary | ICD-10-CM | POA: Diagnosis not present

## 2018-10-02 DIAGNOSIS — Y92002 Bathroom of unspecified non-institutional (private) residence single-family (private) house as the place of occurrence of the external cause: Secondary | ICD-10-CM | POA: Insufficient documentation

## 2018-10-02 DIAGNOSIS — E041 Nontoxic single thyroid nodule: Secondary | ICD-10-CM | POA: Diagnosis not present

## 2018-10-02 DIAGNOSIS — R55 Syncope and collapse: Secondary | ICD-10-CM

## 2018-10-02 DIAGNOSIS — Z87891 Personal history of nicotine dependence: Secondary | ICD-10-CM | POA: Diagnosis not present

## 2018-10-02 DIAGNOSIS — I10 Essential (primary) hypertension: Secondary | ICD-10-CM | POA: Diagnosis not present

## 2018-10-02 DIAGNOSIS — S060X9A Concussion with loss of consciousness of unspecified duration, initial encounter: Secondary | ICD-10-CM | POA: Diagnosis not present

## 2018-10-02 DIAGNOSIS — F0390 Unspecified dementia without behavioral disturbance: Secondary | ICD-10-CM | POA: Diagnosis not present

## 2018-10-02 DIAGNOSIS — R42 Dizziness and giddiness: Secondary | ICD-10-CM | POA: Diagnosis present

## 2018-10-02 DIAGNOSIS — R609 Edema, unspecified: Secondary | ICD-10-CM | POA: Diagnosis not present

## 2018-10-02 DIAGNOSIS — R52 Pain, unspecified: Secondary | ICD-10-CM | POA: Diagnosis not present

## 2018-10-02 DIAGNOSIS — R4020 Unspecified coma: Secondary | ICD-10-CM | POA: Diagnosis not present

## 2018-10-02 NOTE — ED Triage Notes (Signed)
Pt was taking a shower at home when she slipped getting out and hit head on sink causing her to lose consciousness. Pt with hx of dementia but currently alert and oriented. Pt has some abrasions to forehead as well a knot on frontal part of scalp.

## 2018-10-02 NOTE — ED Provider Notes (Signed)
[facility] EMERGENCY DEPARTMENT Provider Note   CSN: [id number] Arrival date & time: 10/02/18  2220     History   Chief Complaint Chief Complaint  Patient presents with   Fall    HPI Linda Fletcher is a 78 y.o. female.     The history is provided by the patient.  Fall This is a new problem. The problem occurs constantly. The problem has not changed since onset.Associated symptoms include headaches. Pertinent negatives include no chest pain and no shortness of breath. The symptoms are aggravated by standing. Nothing relieves the symptoms.   Patient with history of depression, dementia, diabetes presents after fall.  She reports that she was in the shower when she felt lightheaded and fell.  She thinks that she had loss of consciousness. She reports prior to get a shower she felt well.  Denies any recent illnesses.   Past Medical History:  Diagnosis Date   Arthritis    Dementia ([place])    Depression    Diabetes mellitus without complication (HCC)    GERD (gastroesophageal reflux disease)    Glaucoma    Hx of adenomatous colonic polyps    Hyperlipidemia    Hypertension    Obesity     Patient Active Problem List   Diagnosis Date Noted   GERD (gastroesophageal reflux disease) 09/26/2015   Hypertension associated with diabetes ([place]) 09/26/2015   Depression, recurrent ([place]) 09/26/2015   Osteoarthritis, shoulder 06/07/2015   Hyperlipidemia associated with type 2 diabetes mellitus ([place]) 09/25/2014   Dementia ([place])    Diabetes mellitus without complication ([place])     Past Surgical History:  Procedure Laterality Date   ABDOMINAL HYSTERECTOMY     CHOLECYSTECTOMY     THYROID CYST EXCISION     TONSILLECTOMY AND ADENOIDECTOMY     age 25     OB History   No obstetric history on file.      Home Medications    Prior to Admission medications   Medication Sig Start Date End Date Taking? Authorizing Provider  calcium carbonate (OS-CAL) 600 MG TABS  tablet Take 1 tablet (600 mg total) by mouth 2 (two) times daily with a meal. Patient not taking: Reported on 09/08/2018 12/17/17   Dettinger, Fransisca Kaufmann, MD  citalopram (CELEXA) 40 MG tablet Take 1 tablet (40 mg total) by mouth daily. (Needs to be seen in November) Patient taking differently: Take 40 mg by mouth daily.  12/17/17   Dettinger, Fransisca Kaufmann, MD  donepezil (ARICEPT) 10 MG tablet Take 1 tablet (10 mg total) by mouth at bedtime. (Needs to be seen in November) Patient taking differently: Take 10 mg by mouth at bedtime.  12/17/17   Dettinger, Fransisca Kaufmann, MD  furosemide (LASIX) 40 MG tablet Take 1 tablet (40 mg total) by mouth daily. Patient not taking: Reported on 09/08/2018 12/17/17   Dettinger, Fransisca Kaufmann, MD  glipiZIDE (GLUCOTROL) 10 MG tablet Take 1 tablet (10 mg total) by mouth 2 (two) times daily. 12/17/17   Dettinger, Fransisca Kaufmann, MD  losartan (COZAAR) 50 MG tablet Take 1 tablet (50 mg total) by mouth daily. 12/17/17   Dettinger, Fransisca Kaufmann, MD  lovastatin (MEVACOR) 20 MG tablet Take 1 tablet (20 mg total) by mouth daily. 12/17/17   Dettinger, Fransisca Kaufmann, MD  metFORMIN (GLUCOPHAGE) 500 MG tablet TAKE 1 TABLET EVERY MORNING  AND TAKE 2 TABLETS EVERY EVENING Patient not taking: Reported on 09/08/2018 12/17/17   Dettinger, Fransisca Kaufmann, MD  omeprazole (PRILOSEC) 40 MG capsule Take 1  capsule (40 mg total) by mouth daily. Patient not taking: Reported on 09/08/2018 12/17/17   Dettinger, Fransisca Kaufmann, MD  Probiotic Product (PROBIOTIC DAILY PO) Take 1 capsule by mouth daily.     [provider]  traZODone (DESYREL) 100 MG tablet Take 1 tablet (100 mg total) by mouth at bedtime as needed for sleep. 06/17/18   Dettinger, Fransisca Kaufmann, MD    Family History Family History  Problem Relation Age of Onset   Arthritis Mother    Diabetes Mother    Diabetes Son    Hypertension Son    Hyperlipidemia Son    Hypertension Daughter     Social History Social History   Tobacco Use   Smoking status: Former Smoker     Quit date: 07/18/1974    Years since quitting: 44.2   Smokeless tobacco: Never Used  Substance Use Topics   Alcohol use: No   Drug use: No     Allergies   Patient has no known allergies.   Review of Systems Review of Systems  Constitutional: Negative for fever.  Respiratory: Negative for shortness of breath.   Cardiovascular: Negative for chest pain.  Gastrointestinal: Negative for vomiting.  Neurological: Positive for headaches.  All other systems reviewed and are negative.    Physical Exam Updated Vital Signs BP (!) 141/64    Pulse 68    Temp 97.9 F (36.6 C)    Resp 18    Ht 1.626 m (5\' 4" )    Wt 81.6 kg    SpO2 97%    BMI 30.90 kg/m   Physical Exam CONSTITUTIONAL: Elderly, no acute distress HEAD: Abrasion to forehead, no lacerations, no signs of trauma EYES: EOMI/PERRL ENMT: Mucous membranes moist, no signs of facial trauma NECK: supple no meningeal signs SPINE/BACK:entire spine nontender, no bruising/crepitance/stepoffs noted to spine CV: S1/S2 noted, no murmurs/rubs/gallops noted LUNGS: Lungs are clear to auscultation bilaterally, no apparent distress ABDOMEN: soft, nontender, no rebound or guarding, bowel sounds noted throughout abdomen GU:no cva tenderness NEURO: Pt is awake/alert/appropriate, moves all extremitiesx4.  No facial droop.  No arm or leg drift EXTREMITIES: pulses normal/equal, full ROM, pelvis stable, all other extremities/joints palpated/ranged and nontender SKIN: warm, color normal  ED Treatments / Results  Labs (all labs ordered are listed, but only abnormal results are displayed) Labs Reviewed  URINALYSIS, ROUTINE W REFLEX MICROSCOPIC - Abnormal; Notable for the following components:      Result Value   Glucose, UA 150 (*)    Protein, ur 30 (*)    Leukocytes,Ua TRACE (*)    All other components within normal limits  BASIC METABOLIC PANEL  CBC WITH DIFFERENTIAL/PLATELET    EKG EKG Interpretation  Date/Time:  Sunday October 02 2018 23:52:45 EDT Ventricular Rate:  63 PR Interval:    QRS Duration: 128 QT Interval:  469 QTC Calculation: 481 R Axis:   -49 Text Interpretation:  Sinus rhythm Nonspecific IVCD with LAD Left ventricular hypertrophy Borderline T abnormalities, inferior leads No significant change since last tracing Confirmed by Ripley Fraise [phone]) on 10/03/2018 1:12:55 AM   Radiology Ct Head Wo Contrast  Result Date: 10/02/2018 CLINICAL DATA:  Fall, loss of consciousness EXAM: CT HEAD WITHOUT CONTRAST; CT CERVICAL SPINE WITHOUT CONTRAST TECHNIQUE: Contiguous axial images were obtained from the base of the skull through the vertex without intravenous contrast. COMPARISON:  None. FINDINGS: Brain: No evidence of acute territorial infarction, hemorrhage, hydrocephalus,extra-axial collection or mass lesion/mass effect. There is dilatation the ventricles and sulci consistent with  age-related atrophy. Low-attenuation changes in the deep white matter consistent with small vessel ischemia. Vascular: No hyperdense vessel or unexpected calcification. Skull: The skull is intact. No fracture or focal lesion identified. Sinuses/Orbits: The visualized paranasal sinuses and mastoid air cells are clear. The orbits and globes intact. Other: small soft tissue hematoma seen overlying the right and left frontal skull. Cervical spine: Alignment: There is straightening of the normal cervical lordosis. Skull base and vertebrae: Visualized skull base is intact. No atlanto-occipital dissociation. The vertebral body heights are well maintained. No fracture or pathologic osseous lesion seen. Soft tissues and spinal canal: The visualized paraspinal soft tissues are unremarkable. No prevertebral soft tissue swelling is seen. The spinal canal is grossly unremarkable, no large epidural collection or significant canal narrowing. Disc levels: Disc height loss with disc osteophyte complex and uncovertebral osteophytes are most notable at C5-C6 and  C6-C7. Upper chest: There is a large heterogeneous mass seen within the right thyroid lobe measuring 4.1 cm with peripheral coarse calcifications. The mass extends into the upper mediastinum. Other: None IMPRESSION: 1. No acute intracranial abnormality. 2. Findings consistent with age related atrophy and chronic small vessel ischemia 3. Two small soft tissue hematoma seen overlying the right and left frontal skull 4.  No acute fracture or malalignment of the spine. 5. Large 4.1 cm heterogeneous mass seen within the right thyroid lobe. would recommend thyroid ultrasound for further evaluation. Electronically Signed   By: Prudencio Pair M.D.   On: 10/02/2018 23:53   Ct Cervical Spine Wo Contrast  Result Date: 10/02/2018 CLINICAL DATA:  Fall, loss of consciousness EXAM: CT HEAD WITHOUT CONTRAST; CT CERVICAL SPINE WITHOUT CONTRAST TECHNIQUE: Contiguous axial images were obtained from the base of the skull through the vertex without intravenous contrast. COMPARISON:  None. FINDINGS: Brain: No evidence of acute territorial infarction, hemorrhage, hydrocephalus,extra-axial collection or mass lesion/mass effect. There is dilatation the ventricles and sulci consistent with age-related atrophy. Low-attenuation changes in the deep white matter consistent with small vessel ischemia. Vascular: No hyperdense vessel or unexpected calcification. Skull: The skull is intact. No fracture or focal lesion identified. Sinuses/Orbits: The visualized paranasal sinuses and mastoid air cells are clear. The orbits and globes intact. Other: small soft tissue hematoma seen overlying the right and left frontal skull. Cervical spine: Alignment: There is straightening of the normal cervical lordosis. Skull base and vertebrae: Visualized skull base is intact. No atlanto-occipital dissociation. The vertebral body heights are well maintained. No fracture or pathologic osseous lesion seen. Soft tissues and spinal canal: The visualized paraspinal  soft tissues are unremarkable. No prevertebral soft tissue swelling is seen. The spinal canal is grossly unremarkable, no large epidural collection or significant canal narrowing. Disc levels: Disc height loss with disc osteophyte complex and uncovertebral osteophytes are most notable at C5-C6 and C6-C7. Upper chest: There is a large heterogeneous mass seen within the right thyroid lobe measuring 4.1 cm with peripheral coarse calcifications. The mass extends into the upper mediastinum. Other: None IMPRESSION: 1. No acute intracranial abnormality. 2. Findings consistent with age related atrophy and chronic small vessel ischemia 3. Two small soft tissue hematoma seen overlying the right and left frontal skull 4.  No acute fracture or malalignment of the spine. 5. Large 4.1 cm heterogeneous mass seen within the right thyroid lobe. would recommend thyroid ultrasound for further evaluation. Electronically Signed   By: Prudencio Pair M.D.   On: 10/02/2018 23:53    Procedures Procedures   Medications Ordered in ED Medications - No data  to display   Initial Impression / Assessment and Plan / ED Course  I have reviewed the triage vital signs and the nursing notes.  Pertinent labs & imaging results that were available during my care of the patient were reviewed by me and considered in my medical decision making (see chart for details).        11:49 PM Patient presents after fall in the shower.  Initial reports were that she slipped in the shower.  She reports to me that she felt lightheaded prior to falling.  She does report feeling well recently.  History is somewhat limited as she does have a history of dementia but appears to be high functioning.  I spoke to her son via phone who reports she is actually been doing well lately.  He confirms the story of her falling in the shower. Of note, she was seen last month for a suicide attempt however her son reports that she has been doing well from that  perspective lately We will start with imaging of head and neck as I am unable to clear her C-spine.  Labs and EKG are also pending. 1:15 AM CT head negative for acute injury.   She was found to have incidental thyroid nodule that will need follow-up with PCP for ultrasound   Patient ambulated well.  Discussed with son about thyroid nodule, she has PCP follow-up later today.  I feel she is safe for discharge  Vitals:   10/03/18 0115 10/03/18 0130 10/03/18 0145 10/03/18 0200  BP:  (!) 151/68  (!) 148/57  Pulse: 63 62 62 (!) 57  Temp:      Resp: 15 14 16 19   Height:      Weight:      SpO2: 99% 99% 100% 98%  BMI (Calculated):       Final Clinical Impressions(s) / ED Diagnoses   Final diagnoses:  Fall, initial encounter  Concussion with loss of consciousness, initial encounter  Syncope and collapse  Thyroid nodule    ED Discharge Orders    None       Ripley Fraise, MD 10/03/18 0230

## 2018-10-03 ENCOUNTER — Encounter: Payer: Self-pay | Admitting: Family Medicine

## 2018-10-03 ENCOUNTER — Ambulatory Visit (INDEPENDENT_AMBULATORY_CARE_PROVIDER_SITE_OTHER): Payer: Medicare HMO | Admitting: Family Medicine

## 2018-10-03 VITALS — BP 158/79 | HR 71 | Temp 97.5°F | Resp 16 | Ht 64.0 in | Wt 182.8 lb

## 2018-10-03 DIAGNOSIS — E1159 Type 2 diabetes mellitus with other circulatory complications: Secondary | ICD-10-CM

## 2018-10-03 DIAGNOSIS — E119 Type 2 diabetes mellitus without complications: Secondary | ICD-10-CM | POA: Diagnosis not present

## 2018-10-03 DIAGNOSIS — E1169 Type 2 diabetes mellitus with other specified complication: Secondary | ICD-10-CM

## 2018-10-03 DIAGNOSIS — R6889 Other general symptoms and signs: Secondary | ICD-10-CM | POA: Diagnosis not present

## 2018-10-03 DIAGNOSIS — D649 Anemia, unspecified: Secondary | ICD-10-CM | POA: Insufficient documentation

## 2018-10-03 DIAGNOSIS — E041 Nontoxic single thyroid nodule: Secondary | ICD-10-CM

## 2018-10-03 DIAGNOSIS — K219 Gastro-esophageal reflux disease without esophagitis: Secondary | ICD-10-CM

## 2018-10-03 DIAGNOSIS — I10 Essential (primary) hypertension: Secondary | ICD-10-CM | POA: Diagnosis not present

## 2018-10-03 DIAGNOSIS — E785 Hyperlipidemia, unspecified: Secondary | ICD-10-CM

## 2018-10-03 LAB — BASIC METABOLIC PANEL
Anion gap: 8 (ref 5–15)
BUN: 16 mg/dL (ref 8–23)
CO2: 31 mmol/L (ref 22–32)
Calcium: 9 mg/dL (ref 8.9–10.3)
Chloride: 101 mmol/L (ref 98–111)
Creatinine, Ser: 1.01 mg/dL — ABNORMAL HIGH (ref 0.44–1.00)
GFR calc Af Amer: >60 mL/min (ref >60)
GFR calc non Af Amer: 53 mL/min — ABNORMAL LOW (ref >60)
Glucose, Bld: 167 mg/dL — ABNORMAL HIGH (ref 70–99)
Potassium: 3.1 mmol/L — ABNORMAL LOW (ref 3.5–5.1)
Sodium: 140 mmol/L (ref 135–145)

## 2018-10-03 LAB — URINALYSIS, ROUTINE W REFLEX MICROSCOPIC
Bacteria, UA: NONE SEEN
Bilirubin Urine: NEGATIVE
Glucose, UA: 150 mg/dL — AB
Hgb urine dipstick: NEGATIVE
Ketones, ur: NEGATIVE mg/dL
Nitrite: NEGATIVE
Protein, ur: 30 mg/dL — AB
Specific Gravity, Urine: 1.015 (ref 1.005–1.030)
pH: 5 (ref 5.0–8.0)

## 2018-10-03 LAB — CBC WITH DIFFERENTIAL/PLATELET
Abs Immature Granulocytes: 0.02 10*3/uL (ref 0.00–0.07)
Basophils Absolute: 0 10*3/uL (ref 0.0–0.1)
Basophils Relative: 0 %
Eosinophils Absolute: 0.2 10*3/uL (ref 0.0–0.5)
Eosinophils Relative: 2 %
HCT: 32.5 % — ABNORMAL LOW (ref 36.0–46.0)
Hemoglobin: 9.8 g/dL — ABNORMAL LOW (ref 12.0–15.0)
Immature Granulocytes: 0 %
Lymphocytes Relative: 19 %
Lymphs Abs: 1.4 10*3/uL (ref 0.7–4.0)
MCH: 25.6 pg — ABNORMAL LOW (ref 26.0–34.0)
MCHC: 30.2 g/dL (ref 30.0–36.0)
MCV: 84.9 fL (ref 80.0–100.0)
Monocytes Absolute: 0.6 10*3/uL (ref 0.1–1.0)
Monocytes Relative: 8 %
Neutro Abs: 5 10*3/uL (ref 1.7–7.7)
Neutrophils Relative %: 71 %
Platelets: 169 10*3/uL (ref 150–400)
RBC: 3.83 MIL/uL — ABNORMAL LOW (ref 3.87–5.11)
RDW: 14.8 % (ref 11.5–15.5)
WBC: 7.1 10*3/uL (ref 4.0–10.5)
nRBC: 0 % (ref 0.0–0.2)

## 2018-10-03 LAB — BAYER DCA HB A1C WAIVED: HB A1C (BAYER DCA - WAIVED): 6.8 % (ref <7.0)

## 2018-10-03 NOTE — Progress Notes (Signed)
BP (!) 158/79   Pulse 71   Temp (!) 97.5 F (36.4 C) (Temporal)   Resp 16   Ht 5' 4" (1.626 m)   Wt 182 lb 12.8 oz (82.9 kg)   SpO2 99%   BMI 31.38 kg/m    Subjective:   Patient ID: Linda Fletcher, female    DOB: 1940/07/13, 78 y.o.   MRN: [id number]  HPI: Linda Fletcher is a 78 y.o. female presenting on 10/03/2018 for Hypertension (check up of chronic medical conditions) and Hospitalization Follow-up (AP- fall - 9/20)   HPI Patient was in the hospital recently in the emergency department yesterday night for a fall, she says she felt lightheaded or dizzy and fell and hit her head on the bathtub while she was taking shower.  She has a shower chair but she is not using it.  The emergency department took her blood pressure right away and it was in the 150s and her blood sugar was in the 220s so it was not low.  She denies having any chest pain or palpitations.  She says she has had some episodes of lightheadedness and dizziness but also spinning sensation, it sounds like it could be vertigo or lightheadedness.  Patient denies any chest pain or palpitations or shortness of breath.  Patient was also found to be anemic more than she had normally been in the hospital  Type 2 diabetes mellitus Patient comes in today for recheck of his diabetes. Patient has been currently taking metformin and glipizide. Patient is currently on an ACE inhibitor/ARB. Patient has not seen an ophthalmologist this year. Patient eyes any issues with their feet.   Hyperlipidemia Patient is coming in for recheck of his hyperlipidemia. The patient is currently taking lovastatin. They deny any issues with myalgias or history of liver damage from it. They deny any focal numbness or weakness or chest pain.   Hypertension Patient is currently on losartan and, and their blood pressure today is 158/79. Patient denies any lightheadedness or dizziness. Patient denies headaches, blurred vision, chest pains, shortness of breath, or  weakness. Denies any side effects from medication and is content with current medication.   Thyroid nodule Patient is coming in for thyroid nodule today as well. They deny any issues with hair changes or heat or cold problems or diarrhea or constipation. They deny any chest pain or palpitations.  They found a nodule on CT scan in the hospital  GERD Patient is currently on omeprazole.  She denies any major symptoms or abdominal pain or belching or burping. She denies any blood in her stool or lightheadedness or dizziness.   Relevant past medical, surgical, family and social history reviewed and updated as indicated. Interim medical history since our last visit reviewed. Allergies and medications reviewed and updated.  Review of Systems  Constitutional: Negative for chills and fever.  HENT: Negative for congestion, ear discharge and ear pain.   Eyes: Negative for redness and visual disturbance.  Respiratory: Negative for chest tightness and shortness of breath.   Cardiovascular: Negative for chest pain and leg swelling.  Genitourinary: Negative for difficulty urinating and dysuria.  Musculoskeletal: Negative for back pain and gait problem.  Skin: Negative for rash.  Neurological: Positive for dizziness and light-headedness. Negative for speech difficulty, weakness, numbness and headaches.  Psychiatric/Behavioral: Negative for agitation, behavioral problems, self-injury, sleep disturbance and suicidal ideas.  All other systems reviewed and are negative.   Per HPI unless specifically indicated above   Allergies as of 10/03/2018  No Known Allergies     Medication List       Accurate as of October 03, 2018  8:32 AM. If you have any questions, ask your nurse or doctor.        calcium carbonate 600 MG Tabs tablet Commonly known as: OS-CAL Take 1 tablet (600 mg total) by mouth 2 (two) times daily with a meal.   citalopram 40 MG tablet Commonly known as: CELEXA Take 1 tablet (40  mg total) by mouth daily. (Needs to be seen in November) What changed: additional instructions   donepezil 10 MG tablet Commonly known as: ARICEPT Take 1 tablet (10 mg total) by mouth at bedtime. (Needs to be seen in November) What changed: additional instructions   furosemide 40 MG tablet Commonly known as: LASIX Take 1 tablet (40 mg total) by mouth daily.   glipiZIDE 10 MG tablet Commonly known as: GLUCOTROL Take 1 tablet (10 mg total) by mouth 2 (two) times daily.   losartan 50 MG tablet Commonly known as: COZAAR Take 1 tablet (50 mg total) by mouth daily.   lovastatin 20 MG tablet Commonly known as: MEVACOR Take 1 tablet (20 mg total) by mouth daily.   metFORMIN 500 MG tablet Commonly known as: GLUCOPHAGE TAKE 1 TABLET EVERY MORNING  AND TAKE 2 TABLETS EVERY EVENING   omeprazole 40 MG capsule Commonly known as: PRILOSEC Take 1 capsule (40 mg total) by mouth daily.   PROBIOTIC DAILY PO Take 1 capsule by mouth daily.   traZODone 100 MG tablet Commonly known as: DESYREL Take 1 tablet (100 mg total) by mouth at bedtime as needed for sleep.        Objective:   BP (!) 158/79   Pulse 71   Temp (!) 97.5 F (36.4 C) (Temporal)   Resp 16   Ht 5' 4" (1.626 m)   Wt 182 lb 12.8 oz (82.9 kg)   SpO2 99%   BMI 31.38 kg/m   Wt Readings from Last 3 Encounters:  10/03/18 182 lb 12.8 oz (82.9 kg)  10/02/18 180 lb (81.6 kg)  09/08/18 180 lb (81.6 kg)    Physical Exam Vitals signs and nursing note reviewed.  Constitutional:      General: She is not in acute distress.    Appearance: She is well-developed. She is not diaphoretic.  Eyes:     Conjunctiva/sclera: Conjunctivae normal.  Neck:     Thyroid: No thyroid mass (No palpable thyroid mass), thyromegaly or thyroid tenderness.  Cardiovascular:     Rate and Rhythm: Normal rate and regular rhythm.     Heart sounds: Normal heart sounds. No murmur.  Pulmonary:     Effort: Pulmonary effort is normal. No respiratory  distress.     Breath sounds: Normal breath sounds. No wheezing.  Musculoskeletal: Normal range of motion.        General: No tenderness.  Skin:    General: Skin is warm and dry.     Findings: No rash.  Neurological:     Mental Status: She is alert and oriented to person, place, and time.     Coordination: Coordination normal.  Psychiatric:        Behavior: Behavior normal.     Results for orders placed or performed during the hospital encounter of 39/76/73  Basic metabolic panel  Result Value Ref Range   Sodium 140 135 - 145 mmol/L   Potassium 3.1 (L) 3.5 - 5.1 mmol/L   Chloride 101 98 - 111 mmol/L  CO2 31 22 - 32 mmol/L   Glucose, Bld 167 (H) 70 - 99 mg/dL   BUN 16 8 - 23 mg/dL   Creatinine, Ser 1.01 (H) 0.44 - 1.00 mg/dL   Calcium 9.0 8.9 - 10.3 mg/dL   GFR calc non Af Amer 53 (L) >60 mL/min   GFR calc Af Amer >60 >60 mL/min   Anion gap 8 5 - 15  CBC with Differential/Platelet  Result Value Ref Range   WBC 7.1 4.0 - 10.5 K/uL   RBC 3.83 (L) 3.87 - 5.11 MIL/uL   Hemoglobin 9.8 (L) 12.0 - 15.0 g/dL   HCT 32.5 (L) 36.0 - 46.0 %   MCV 84.9 80.0 - 100.0 fL   MCH 25.6 (L) 26.0 - 34.0 pg   MCHC 30.2 30.0 - 36.0 g/dL   RDW 14.8 11.5 - 15.5 %   Platelets 169 150 - 400 K/uL   nRBC 0.0 0.0 - 0.2 %   Neutrophils Relative % 71 %   Neutro Abs 5.0 1.7 - 7.7 K/uL   Lymphocytes Relative 19 %   Lymphs Abs 1.4 0.7 - 4.0 K/uL   Monocytes Relative 8 %   Monocytes Absolute 0.6 0.1 - 1.0 K/uL   Eosinophils Relative 2 %   Eosinophils Absolute 0.2 0.0 - 0.5 K/uL   Basophils Relative 0 %   Basophils Absolute 0.0 0.0 - 0.1 K/uL   Immature Granulocytes 0 %   Abs Immature Granulocytes 0.02 0.00 - 0.07 K/uL  Urinalysis, Routine w reflex microscopic  Result Value Ref Range   Color, Urine YELLOW YELLOW   APPearance CLEAR CLEAR   Specific Gravity, Urine 1.015 1.005 - 1.030   pH 5.0 5.0 - 8.0   Glucose, UA 150 (A) NEGATIVE mg/dL   Hgb urine dipstick NEGATIVE NEGATIVE   Bilirubin Urine  NEGATIVE NEGATIVE   Ketones, ur NEGATIVE NEGATIVE mg/dL   Protein, ur 30 (A) NEGATIVE mg/dL   Nitrite NEGATIVE NEGATIVE   Leukocytes,Ua TRACE (A) NEGATIVE   RBC / HPF 0-5 0 - 5 RBC/hpf   WBC, UA 6-10 0 - 5 WBC/hpf   Bacteria, UA NONE SEEN NONE SEEN   Squamous Epithelial / LPF 0-5 0 - 5    Assessment & Plan:   Problem List Items Addressed This Visit      Cardiovascular and Mediastinum   Hypertension associated with diabetes ([place])   Relevant Orders   CMP14+EGFR     Digestive   GERD (gastroesophageal reflux disease)     Endocrine   Diabetes mellitus without complication ([place]) - Primary   Relevant Orders   CMP14+EGFR   Bayer DCA Hb A1c Waived   Hyperlipidemia associated with type 2 diabetes mellitus (HCC)   Relevant Orders   Lipid panel   Thyroid nodule   Relevant Orders   Thyroid Panel With TSH   US THYROID     Other   Anemia   Relevant Orders   Anemia Profile B      Will send for thyroid nodule, will check a full anemia panel because her blood counts are down, she denies any bleeding episodes.  We will check blood work for diabetes and keep medication for now.  She shows no signs of hypotension and will monitor for now  Follow up plan: Return in about 3 months (around 01/02/2019), or if symptoms worsen or fail to improve, for Diabetes and hypertension.  Counseling provided for all of the vaccine components Orders Placed This Encounter  Procedures  . US THYROID  .  CMP14+EGFR  . Bayer DCA Hb A1c Waived  . Lipid panel  . Thyroid Panel With TSH  . Anemia Profile B    Caryl Pina, MD [facility] 10/03/2018, 8:32 AM

## 2018-10-03 NOTE — ED Notes (Signed)
Ambulated Pt, steady on her feet without assistance

## 2018-10-04 LAB — CMP14+EGFR
ALT: 17 IU/L (ref 0–32)
AST: 18 IU/L (ref 0–40)
Albumin/Globulin Ratio: 1.5 (ref 1.2–2.2)
Albumin: 4 g/dL (ref 3.7–4.7)
Alkaline Phosphatase: 87 IU/L (ref 39–117)
BUN/Creatinine Ratio: 14 (ref 12–28)
BUN: 14 mg/dL (ref 8–27)
Bilirubin Total: 0.3 mg/dL (ref 0.0–1.2)
CO2: 28 mmol/L (ref 20–29)
Calcium: 9.1 mg/dL (ref 8.7–10.3)
Chloride: 100 mmol/L (ref 96–106)
Creatinine, Ser: 1 mg/dL (ref 0.57–1.00)
GFR calc Af Amer: 62 mL/min/{1.73_m2} (ref >59)
GFR calc non Af Amer: 54 mL/min/{1.73_m2} — ABNORMAL LOW (ref >59)
Globulin, Total: 2.6 g/dL (ref 1.5–4.5)
Glucose: 120 mg/dL — ABNORMAL HIGH (ref 65–99)
Potassium: 3.4 mmol/L — ABNORMAL LOW (ref 3.5–5.2)
Sodium: 144 mmol/L (ref 134–144)
Total Protein: 6.6 g/dL (ref 6.0–8.5)

## 2018-10-04 LAB — THYROID PANEL WITH TSH
Free Thyroxine Index: 2.4 (ref 1.2–4.9)
T3 Uptake Ratio: 25 % (ref 24–39)
T4, Total: 9.6 ug/dL (ref 4.5–12.0)
TSH: 4.17 u[IU]/mL (ref 0.450–4.500)

## 2018-10-04 LAB — ANEMIA PROFILE B
Basophils Absolute: 0 10*3/uL (ref 0.0–0.2)
Basos: 1 %
EOS (ABSOLUTE): 0.2 10*3/uL (ref 0.0–0.4)
Eos: 2 %
Ferritin: 12 ng/mL — ABNORMAL LOW (ref 15–150)
Folate: 16.7 ng/mL (ref >3.0)
Hematocrit: 32.7 % — ABNORMAL LOW (ref 34.0–46.6)
Hemoglobin: 10.5 g/dL — ABNORMAL LOW (ref 11.1–15.9)
Immature Grans (Abs): 0 10*3/uL (ref 0.0–0.1)
Immature Granulocytes: 0 %
Iron Saturation: 9 % — CL (ref 15–55)
Iron: 31 ug/dL (ref 27–139)
Lymphocytes Absolute: 1.3 10*3/uL (ref 0.7–3.1)
Lymphs: 19 %
MCH: 25.8 pg — ABNORMAL LOW (ref 26.6–33.0)
MCHC: 32.1 g/dL (ref 31.5–35.7)
MCV: 80 fL (ref 79–97)
Monocytes Absolute: 0.5 10*3/uL (ref 0.1–0.9)
Monocytes: 8 %
Neutrophils Absolute: 4.9 10*3/uL (ref 1.4–7.0)
Neutrophils: 70 %
Platelets: 183 10*3/uL (ref 150–450)
RBC: 4.07 x10E6/uL (ref 3.77–5.28)
RDW: 15.1 % (ref 11.7–15.4)
Retic Ct Pct: 2.3 % (ref 0.6–2.6)
Total Iron Binding Capacity: 354 ug/dL (ref 250–450)
UIBC: 323 ug/dL (ref 118–369)
Vitamin B-12: 787 pg/mL (ref 232–1245)
WBC: 7 10*3/uL (ref 3.4–10.8)

## 2018-10-04 LAB — LIPID PANEL
Chol/HDL Ratio: 4.2 ratio (ref 0.0–4.4)
Cholesterol, Total: 169 mg/dL (ref 100–199)
HDL: 40 mg/dL (ref >39)
LDL Chol Calc (NIH): 88 mg/dL (ref 0–99)
Triglycerides: 248 mg/dL — ABNORMAL HIGH (ref 0–149)
VLDL Cholesterol Cal: 41 mg/dL — ABNORMAL HIGH (ref 5–40)

## 2018-10-07 ENCOUNTER — Other Ambulatory Visit: Payer: Self-pay | Admitting: *Deleted

## 2018-10-07 MED ORDER — POTASSIUM CHLORIDE ER 10 MEQ PO TBCR: 10.0000 meq | EXTENDED_RELEASE_TABLET | Freq: Every day | ORAL | 1 refills | Status: DC

## 2018-10-07 MED ORDER — FERROUS SULFATE 325 (65 FE) MG PO TABS: 325.0000 mg | ORAL_TABLET | Freq: Every day | ORAL | 1 refills | Status: DC

## 2018-10-11 ENCOUNTER — Ambulatory Visit (HOSPITAL_COMMUNITY)
Admission: RE | Admit: 2018-10-11 | Discharge: 2018-10-11 | Disposition: A | Discharge status: Home / Self Care | Payer: Medicare HMO | Source: Ambulatory Visit | Attending: Family Medicine | Admitting: Family Medicine

## 2018-10-11 ENCOUNTER — Other Ambulatory Visit: Payer: Self-pay

## 2018-10-11 DIAGNOSIS — E041 Nontoxic single thyroid nodule: Secondary | ICD-10-CM | POA: Diagnosis not present

## 2018-10-11 DIAGNOSIS — E042 Nontoxic multinodular goiter: Secondary | ICD-10-CM | POA: Diagnosis not present

## 2018-10-13 ENCOUNTER — Telehealth: Payer: Self-pay | Admitting: Family Medicine

## 2018-10-13 DIAGNOSIS — E041 Nontoxic single thyroid nodule: Secondary | ICD-10-CM

## 2018-10-13 NOTE — Telephone Encounter (Signed)
Please let the patient know that 1 of her thyroid nodules was large enough to be biopsied and they wanted to go ahead and biopsy it, I have placed this order to get scheduled.

## 2018-10-13 NOTE — Telephone Encounter (Signed)
Patient aware.

## 2018-10-17 ENCOUNTER — Telehealth: Payer: Self-pay | Admitting: Family Medicine

## 2018-10-18 ENCOUNTER — Other Ambulatory Visit: Payer: Self-pay | Admitting: Family Medicine

## 2018-10-18 DIAGNOSIS — E041 Nontoxic single thyroid nodule: Secondary | ICD-10-CM

## 2018-10-26 ENCOUNTER — Encounter (HOSPITAL_COMMUNITY): Payer: Self-pay

## 2018-10-26 ENCOUNTER — Ambulatory Visit (HOSPITAL_COMMUNITY)
Admission: RE | Admit: 2018-10-26 | Discharge: 2018-10-26 | Disposition: A | Discharge status: Home / Self Care | Payer: Medicare HMO | Source: Ambulatory Visit | Attending: Family Medicine | Admitting: Family Medicine

## 2018-10-26 ENCOUNTER — Other Ambulatory Visit: Payer: Self-pay

## 2018-10-26 DIAGNOSIS — E042 Nontoxic multinodular goiter: Secondary | ICD-10-CM | POA: Diagnosis not present

## 2018-10-26 DIAGNOSIS — J181 Lobar pneumonia, unspecified organism: Secondary | ICD-10-CM | POA: Diagnosis not present

## 2018-10-26 DIAGNOSIS — E0789 Other specified disorders of thyroid: Secondary | ICD-10-CM | POA: Diagnosis not present

## 2018-10-26 DIAGNOSIS — E041 Nontoxic single thyroid nodule: Secondary | ICD-10-CM | POA: Diagnosis not present

## 2018-10-26 MED ORDER — LIDOCAINE HCL (PF) 2 % IJ SOLN
INTRAMUSCULAR | Status: AC
  Filled 2018-10-26: qty 10

## 2018-10-28 LAB — CYTOLOGY - NON PAP

## 2018-11-04 DIAGNOSIS — E042 Nontoxic multinodular goiter: Secondary | ICD-10-CM | POA: Diagnosis not present

## 2018-11-22 ENCOUNTER — Telehealth: Payer: Self-pay | Admitting: Family Medicine

## 2018-11-22 ENCOUNTER — Encounter (HOSPITAL_COMMUNITY): Payer: Self-pay

## 2018-11-22 NOTE — Telephone Encounter (Signed)
Spoke with son - set up flu appt, Dettinger appt and aware of recent test results

## 2018-11-24 ENCOUNTER — Ambulatory Visit: Payer: Medicare HMO

## 2018-12-26 DIAGNOSIS — H26492 Other secondary cataract, left eye: Secondary | ICD-10-CM | POA: Diagnosis not present

## 2018-12-26 DIAGNOSIS — H40011 Open angle with borderline findings, low risk, right eye: Secondary | ICD-10-CM | POA: Diagnosis not present

## 2018-12-26 DIAGNOSIS — H2511 Age-related nuclear cataract, right eye: Secondary | ICD-10-CM | POA: Diagnosis not present

## 2018-12-26 DIAGNOSIS — Z961 Presence of intraocular lens: Secondary | ICD-10-CM | POA: Diagnosis not present

## 2018-12-26 DIAGNOSIS — E119 Type 2 diabetes mellitus without complications: Secondary | ICD-10-CM | POA: Diagnosis not present

## 2018-12-26 DIAGNOSIS — H524 Presbyopia: Secondary | ICD-10-CM | POA: Diagnosis not present

## 2018-12-26 LAB — HM DIABETES EYE EXAM

## 2018-12-29 DIAGNOSIS — H401131 Primary open-angle glaucoma, bilateral, mild stage: Secondary | ICD-10-CM | POA: Diagnosis not present

## 2018-12-29 DIAGNOSIS — H25811 Combined forms of age-related cataract, right eye: Secondary | ICD-10-CM | POA: Diagnosis not present

## 2019-01-09 ENCOUNTER — Other Ambulatory Visit: Payer: Self-pay | Admitting: Family Medicine

## 2019-01-16 ENCOUNTER — Other Ambulatory Visit: Payer: Self-pay

## 2019-01-17 ENCOUNTER — Encounter: Payer: Self-pay | Admitting: Family Medicine

## 2019-01-17 ENCOUNTER — Ambulatory Visit (INDEPENDENT_AMBULATORY_CARE_PROVIDER_SITE_OTHER): Payer: Medicare HMO | Admitting: Family Medicine

## 2019-01-17 VITALS — BP 137/76 | HR 77 | Temp 97.6°F | Ht 64.0 in | Wt 179.8 lb

## 2019-01-17 DIAGNOSIS — I10 Essential (primary) hypertension: Secondary | ICD-10-CM

## 2019-01-17 DIAGNOSIS — K219 Gastro-esophageal reflux disease without esophagitis: Secondary | ICD-10-CM

## 2019-01-17 DIAGNOSIS — E1159 Type 2 diabetes mellitus with other circulatory complications: Secondary | ICD-10-CM | POA: Diagnosis not present

## 2019-01-17 DIAGNOSIS — E119 Type 2 diabetes mellitus without complications: Secondary | ICD-10-CM

## 2019-01-17 DIAGNOSIS — F339 Major depressive disorder, recurrent, unspecified: Secondary | ICD-10-CM | POA: Diagnosis not present

## 2019-01-17 DIAGNOSIS — E785 Hyperlipidemia, unspecified: Secondary | ICD-10-CM | POA: Diagnosis not present

## 2019-01-17 DIAGNOSIS — E1169 Type 2 diabetes mellitus with other specified complication: Secondary | ICD-10-CM

## 2019-01-17 LAB — BAYER DCA HB A1C WAIVED: HB A1C (BAYER DCA - WAIVED): 6.6 % (ref <7.0)

## 2019-01-17 MED ORDER — GLIPIZIDE 10 MG PO TABS: 10.0000 mg | ORAL_TABLET | Freq: Two times a day (BID) | ORAL | 3 refills | Status: DC

## 2019-01-17 MED ORDER — FERROUS SULFATE 325 (65 FE) MG PO TABS: 325.0000 mg | ORAL_TABLET | Freq: Every day | ORAL | 1 refills | Status: DC

## 2019-01-17 MED ORDER — METFORMIN HCL 500 MG PO TABS: ORAL_TABLET | ORAL | 3 refills | Status: DC

## 2019-01-17 MED ORDER — LOVASTATIN 20 MG PO TABS: 20.0000 mg | ORAL_TABLET | Freq: Every day | ORAL | 3 refills | Status: DC

## 2019-01-17 MED ORDER — LOSARTAN POTASSIUM 50 MG PO TABS: 50.0000 mg | ORAL_TABLET | Freq: Every day | ORAL | 3 refills | Status: DC

## 2019-01-17 MED ORDER — DONEPEZIL HCL 10 MG PO TABS: 10.0000 mg | ORAL_TABLET | Freq: Every day | ORAL | 0 refills | Status: DC

## 2019-01-17 MED ORDER — OMEPRAZOLE 40 MG PO CPDR: 40.0000 mg | DELAYED_RELEASE_CAPSULE | Freq: Every day | ORAL | 3 refills | Status: DC

## 2019-01-17 MED ORDER — POTASSIUM CHLORIDE ER 10 MEQ PO TBCR: 10.0000 meq | EXTENDED_RELEASE_TABLET | Freq: Every day | ORAL | 1 refills | Status: DC

## 2019-01-17 MED ORDER — CITALOPRAM HYDROBROMIDE 40 MG PO TABS: 40.0000 mg | ORAL_TABLET | Freq: Every day | ORAL | 0 refills | Status: DC

## 2019-01-17 MED ORDER — FUROSEMIDE 40 MG PO TABS: 40.0000 mg | ORAL_TABLET | Freq: Every day | ORAL | 3 refills | Status: DC

## 2019-01-17 MED ORDER — TRAZODONE HCL 100 MG PO TABS: 100.0000 mg | ORAL_TABLET | Freq: Every evening | ORAL | 3 refills | Status: DC | PRN

## 2019-01-17 NOTE — Progress Notes (Signed)
BP 137/76   Pulse 77   Temp 97.6 F (36.4 C) (Temporal)   Ht '5\' 4"'$  (1.626 m)   Wt 179 lb 12.8 oz (81.6 kg)   SpO2 98%   BMI 30.86 kg/m    Subjective:   Patient ID: Linda Fletcher, female    DOB: December 28, 1940, 79 y.o.   MRN: [id number]  HPI: Linda Fletcher is a 79 y.o. female presenting on 01/17/2019 for Diabetes (3 month follow up)   HPI Depression and Alzheimer's mood related disorder Patient is coming in for recheck on depression Alzheimer's.  She still is having some memory issues.  She is currently taking the Aricept and the citalopram and trazodone for sleep, she feels like these are helping mood and is happy and content today. Depression screen [facility] 2/9 01/17/2019 10/03/2018 08/15/2018 06/17/2018 12/14/2017  Decreased Interest 0 0 0 0 3  Down, Depressed, Hopeless 0 0 0 1 3  PHQ - 2 Score 0 0 0 1 6  Altered sleeping - - - - 3  Tired, decreased energy - - - - 1  Change in appetite - - - - 1  Feeling bad or failure about yourself  - - - - 3  Trouble concentrating - - - - 0  Moving slowly or fidgety/restless - - - - 0  Suicidal thoughts - - - - 0  PHQ-9 Score - - - - 14  Difficult doing work/chores - - - - Very difficult  Some recent data might be hidden    Type 2 diabetes mellitus Patient comes in today for recheck of his diabetes. Patient has been currently taking Metformin and glipizide. Patient is currently on an ACE inhibitor/ARB. Patient has not seen an ophthalmologist this year. Patient denies any issues with their feet.   Hypertension Patient is currently on losartan, and their blood pressure today is 137/76. Patient does have some lightheadedness when she stands up, we have not been able to find the cause for this and her blood pressures are not down.. Patient denies headaches, blurred vision, chest pains, shortness of breath, or weakness. Denies any side effects from medication and is content with current medication.  Patient also takes Lasix as needed  Hyperlipidemia Patient  is coming in for recheck of his hyperlipidemia. The patient is currently taking lovastatin. They deny any issues with myalgias or history of liver damage from it. They deny any focal numbness or weakness or chest pain.   Relevant past medical, surgical, family and social history reviewed and updated as indicated. Interim medical history since our last visit reviewed. Allergies and medications reviewed and updated.  Review of Systems  Constitutional: Negative for chills and fever.  HENT: Negative for congestion, ear discharge and ear pain.   Eyes: Negative for visual disturbance.  Respiratory: Negative for chest tightness and shortness of breath.   Cardiovascular: Negative for chest pain and leg swelling.  Genitourinary: Negative for difficulty urinating and dysuria.  Musculoskeletal: Negative for back pain and gait problem.  Skin: Negative for rash.  Neurological: Positive for dizziness and light-headedness. Negative for headaches.  Psychiatric/Behavioral: Negative for agitation, behavioral problems, dysphoric mood and sleep disturbance.  All other systems reviewed and are negative.   Per HPI unless specifically indicated above   Allergies as of 01/17/2019   No Known Allergies     Medication List       Accurate as of January 17, 2019  9:42 AM. If you have any questions, ask your nurse or doctor.  calcium carbonate 600 MG Tabs tablet Commonly known as: OS-CAL Take 1 tablet (600 mg total) by mouth 2 (two) times daily with a meal.   citalopram 40 MG tablet Commonly known as: CELEXA Take 1 tablet (40 mg total) by mouth daily.   donepezil 10 MG tablet Commonly known as: ARICEPT Take 1 tablet (10 mg total) by mouth at bedtime.   ferrous sulfate 325 (65 FE) MG tablet Take 1 tablet (325 mg total) by mouth daily with breakfast.   furosemide 40 MG tablet Commonly known as: LASIX Take 1 tablet (40 mg total) by mouth daily.   glipiZIDE 10 MG tablet Commonly known as:  GLUCOTROL Take 1 tablet (10 mg total) by mouth 2 (two) times daily.   losartan 50 MG tablet Commonly known as: COZAAR Take 1 tablet (50 mg total) by mouth daily.   lovastatin 20 MG tablet Commonly known as: MEVACOR Take 1 tablet (20 mg total) by mouth daily.   metFORMIN 500 MG tablet Commonly known as: GLUCOPHAGE TAKE 1 TABLET EVERY MORNING  AND TAKE 2 TABLETS EVERY EVENING   omeprazole 40 MG capsule Commonly known as: PRILOSEC Take 1 capsule (40 mg total) by mouth daily.   potassium chloride 10 MEQ tablet Commonly known as: KLOR-CON Take 1 tablet (10 mEq total) by mouth daily.   PROBIOTIC DAILY PO Take 1 capsule by mouth daily.   traZODone 100 MG tablet Commonly known as: DESYREL Take 1 tablet (100 mg total) by mouth at bedtime as needed for sleep.        Objective:   BP 137/76   Pulse 77   Temp 97.6 F (36.4 C) (Temporal)   Ht '5\' 4"'$  (1.626 m)   Wt 179 lb 12.8 oz (81.6 kg)   SpO2 98%   BMI 30.86 kg/m   Wt Readings from Last 3 Encounters:  01/17/19 179 lb 12.8 oz (81.6 kg)  10/03/18 182 lb 12.8 oz (82.9 kg)  10/02/18 180 lb (81.6 kg)    Physical Exam Vitals and nursing note reviewed.  Constitutional:      General: She is not in acute distress.    Appearance: She is well-developed. She is not diaphoretic.  Eyes:     Conjunctiva/sclera: Conjunctivae normal.  Cardiovascular:     Rate and Rhythm: Normal rate and regular rhythm.     Heart sounds: Normal heart sounds. No murmur.  Pulmonary:     Effort: Pulmonary effort is normal. No respiratory distress.     Breath sounds: Normal breath sounds. No wheezing.  Musculoskeletal:        General: No tenderness. Normal range of motion.  Skin:    General: Skin is warm and dry.     Findings: No rash.  Neurological:     Mental Status: She is alert and oriented to person, place, and time.     Coordination: Coordination normal.  Psychiatric:        Behavior: Behavior normal.     Results for orders placed or  performed in visit on 12/30/18  HM DIABETES EYE EXAM  Result Value Ref Range   HM Diabetic Eye Exam No Retinopathy No Retinopathy    Assessment & Plan:   Problem List Items Addressed This Visit      Cardiovascular and Mediastinum   Hypertension associated with diabetes ([place])   Relevant Medications   glipiZIDE (GLUCOTROL) 10 MG tablet   furosemide (LASIX) 40 MG tablet   losartan (COZAAR) 50 MG tablet   lovastatin (MEVACOR) 20 MG tablet  metFORMIN (GLUCOPHAGE) 500 MG tablet   Other Relevant Orders   CMP14+EGFR     Digestive   GERD (gastroesophageal reflux disease)   Relevant Medications   omeprazole (PRILOSEC) 40 MG capsule   Other Relevant Orders   CBC with Differential/Platelet     Endocrine   Diabetes mellitus without complication (HCC) - Primary   Relevant Medications   glipiZIDE (GLUCOTROL) 10 MG tablet   losartan (COZAAR) 50 MG tablet   lovastatin (MEVACOR) 20 MG tablet   metFORMIN (GLUCOPHAGE) 500 MG tablet   Other Relevant Orders   hgba1c   Hyperlipidemia associated with type 2 diabetes mellitus (HCC)   Relevant Medications   glipiZIDE (GLUCOTROL) 10 MG tablet   losartan (COZAAR) 50 MG tablet   lovastatin (MEVACOR) 20 MG tablet   metFORMIN (GLUCOPHAGE) 500 MG tablet   Other Relevant Orders   Lipid panel     Other   Depression, recurrent (HCC)   Relevant Medications   traZODone (DESYREL) 100 MG tablet   citalopram (CELEXA) 40 MG tablet   Other Relevant Orders   CBC with Differential/Platelet   TSH      Continue current medication, sounds like blood sugars are up slightly, A1c is 6.6, no major changes warranted today but will just refocus on diet and keep the current medication.  Patient had echo couple years ago which showed no abnormalities, not been able to find a cause for the dizziness.  She does take Lasix as needed and encouraged to use it sparingly.    Follow up plan: Return in about 3 months (around 04/17/2019), or if symptoms worsen or fail  to improve, for Diabetes depression and Alzheimer's recheck.  Counseling provided for all of the vaccine components Orders Placed This Encounter  Procedures  . hgba1c    Caryl Pina, MD [facility] 01/17/2019, 9:42 AM

## 2019-01-17 NOTE — Patient Instructions (Signed)
Patient needs to call over about the thyroid and see if they wanted any type of thyroid follow-up, I cannot see those recommendations or not recommendations, it looks like the biopsy was normal but I cannot tell if they recommended follow-up in a year or not.

## 2019-01-18 LAB — CMP14+EGFR
ALT: 23 IU/L (ref 0–32)
AST: 28 IU/L (ref 0–40)
Albumin/Globulin Ratio: 1.6 (ref 1.2–2.2)
Albumin: 4.2 g/dL (ref 3.7–4.7)
Alkaline Phosphatase: 85 IU/L (ref 39–117)
BUN/Creatinine Ratio: 19 (ref 12–28)
BUN: 22 mg/dL (ref 8–27)
Bilirubin Total: 0.4 mg/dL (ref 0.0–1.2)
CO2: 27 mmol/L (ref 20–29)
Calcium: 9.5 mg/dL (ref 8.7–10.3)
Chloride: 96 mmol/L (ref 96–106)
Creatinine, Ser: 1.15 mg/dL — ABNORMAL HIGH (ref 0.57–1.00)
GFR calc Af Amer: 52 mL/min/{1.73_m2} — ABNORMAL LOW (ref >59)
GFR calc non Af Amer: 45 mL/min/{1.73_m2} — ABNORMAL LOW (ref >59)
Globulin, Total: 2.7 g/dL (ref 1.5–4.5)
Glucose: 163 mg/dL — ABNORMAL HIGH (ref 65–99)
Potassium: 3.5 mmol/L (ref 3.5–5.2)
Sodium: 140 mmol/L (ref 134–144)
Total Protein: 6.9 g/dL (ref 6.0–8.5)

## 2019-01-18 LAB — CBC WITH DIFFERENTIAL/PLATELET
Basophils Absolute: 0 10*3/uL (ref 0.0–0.2)
Basos: 0 %
EOS (ABSOLUTE): 0.2 10*3/uL (ref 0.0–0.4)
Eos: 2 %
Hematocrit: 40 % (ref 34.0–46.6)
Hemoglobin: 13.5 g/dL (ref 11.1–15.9)
Immature Grans (Abs): 0 10*3/uL (ref 0.0–0.1)
Immature Granulocytes: 0 %
Lymphocytes Absolute: 2.5 10*3/uL (ref 0.7–3.1)
Lymphs: 28 %
MCH: 28.4 pg (ref 26.6–33.0)
MCHC: 33.8 g/dL (ref 31.5–35.7)
MCV: 84 fL (ref 79–97)
Monocytes Absolute: 0.7 10*3/uL (ref 0.1–0.9)
Monocytes: 7 %
Neutrophils Absolute: 5.8 10*3/uL (ref 1.4–7.0)
Neutrophils: 63 %
Platelets: 183 10*3/uL (ref 150–450)
RBC: 4.75 x10E6/uL (ref 3.77–5.28)
RDW: 15.4 % (ref 11.7–15.4)
WBC: 9.3 10*3/uL (ref 3.4–10.8)

## 2019-01-18 LAB — LIPID PANEL
Chol/HDL Ratio: 4 ratio (ref 0.0–4.4)
Cholesterol, Total: 186 mg/dL (ref 100–199)
HDL: 47 mg/dL (ref >39)
LDL Chol Calc (NIH): 93 mg/dL (ref 0–99)
Triglycerides: 274 mg/dL — ABNORMAL HIGH (ref 0–149)
VLDL Cholesterol Cal: 46 mg/dL — ABNORMAL HIGH (ref 5–40)

## 2019-01-18 LAB — TSH: TSH: 4.39 u[IU]/mL (ref 0.450–4.500)

## 2019-02-03 DIAGNOSIS — H25811 Combined forms of age-related cataract, right eye: Secondary | ICD-10-CM | POA: Diagnosis not present

## 2019-02-03 DIAGNOSIS — H401131 Primary open-angle glaucoma, bilateral, mild stage: Secondary | ICD-10-CM | POA: Diagnosis not present

## 2019-02-03 DIAGNOSIS — Z01818 Encounter for other preprocedural examination: Secondary | ICD-10-CM | POA: Diagnosis not present

## 2019-02-03 DIAGNOSIS — H409 Unspecified glaucoma: Secondary | ICD-10-CM | POA: Diagnosis not present

## 2019-02-03 DIAGNOSIS — H401111 Primary open-angle glaucoma, right eye, mild stage: Secondary | ICD-10-CM | POA: Diagnosis not present

## 2019-02-17 ENCOUNTER — Ambulatory Visit (INDEPENDENT_AMBULATORY_CARE_PROVIDER_SITE_OTHER): Payer: Medicare HMO | Admitting: *Deleted

## 2019-02-17 DIAGNOSIS — E041 Nontoxic single thyroid nodule: Secondary | ICD-10-CM

## 2019-02-17 DIAGNOSIS — F0280 Dementia in other diseases classified elsewhere without behavioral disturbance: Secondary | ICD-10-CM | POA: Diagnosis not present

## 2019-02-17 DIAGNOSIS — G301 Alzheimer's disease with late onset: Secondary | ICD-10-CM

## 2019-02-17 NOTE — Patient Instructions (Signed)
Visit Information  Goals Addressed            This Visit's Progress     Patient Stated   . "I don't want to fall again" (pt-stated)       Risk for falls in patient with dementia positional lightheadedness.    Current Barriers:  . Chronic Disease Management support and education needs related to fall prevention  Nurse Case Manager Clinical Goal(s):  Marland Kitchen Over the next 60 days, patient will demonstrate improved health management independence as evidenced byno new falls.  Interventions:  . Assessed for falls since last telephone outreach . Reminded to move purposefully and change positions slowly and to keep a clear well lit pathway through her home . Discussed plans with patient for ongoing care management follow up and provided patient with direct contact information for care management team  Patient Self Care Activities:  . Able to perform most ADLs independently but requires assistance with IADLs due to dementia  Please see past updates related to this goal by clicking on the "Past Updates" button in the selected goal        Other   . Thyroid Nodule       Current Barriers:  Marland Kitchen Knowledge Deficits related to thyroid nodule follow-up recommendations  Nurse Case Manager Clinical Goal(s):  Marland Kitchen Over the next 30 days, patient will work with RN CCM to address needs related to thyroid nodule  Interventions:  . Talked with patient by phone . Chart reviewed including lab reports, imaging reports, communication between patient and PCP, and office notes o Cytology report was clear. No follow-up recommendations were included on report.  . Patient is content for now and does not wish to f/u with endocrinology or anyone else regarding the thyroid nodule . Advised to reach out to PCP if she notices any changes  Patient Self Care Activities:  . Performs ADL's independently . Has assistance with some ADLs and IADLs due to dementia  Initial goal documentation      The care management  team will reach out to the patient again over the next 60 days.   [name], BSN, RN-BC Embedded Chronic Care Manager [facility] [place] [facility] Direct Dial: [phone]   The patient verbalized understanding of instructions provided today and declined a print copy of patient instruction materials.

## 2019-02-17 NOTE — Chronic Care Management (AMB) (Signed)
  Chronic Care Management   Follow Up Note   02/17/2019 Name: Linda Fletcher MRN: [id number] DOB: 1940/08/25  Referred by: Dettinger, Fransisca Kaufmann, MD Reason for referral : Chronic Care Management (RN follow up)   Linda Fletcher is a 79 y.o. year old female who is a primary care patient of Dettinger, Fransisca Kaufmann, MD. The CCM team was consulted for assistance with chronic disease management and care coordination needs.    Review of patient status, including review of consultants reports, relevant laboratory and other test results, and collaboration with appropriate care team members and the patient's provider was performed as part of comprehensive patient evaluation and provision of chronic care management services.    RN Care Plan   . "I don't want to fall again" (pt-stated)       Risk for falls in patient with dementia positional lightheadedness.    Current Barriers:  . Chronic Disease Management support and education needs related to fall prevention  Nurse Case Manager Clinical Goal(s):  Marland Kitchen Over the next 60 days, patient will demonstrate improved health management independence as evidenced byno new falls.  Interventions:  . Assessed for falls since last telephone outreach . Reminded to move purposefully and change positions slowly and to keep a clear well lit pathway through her home . Discussed plans with patient for ongoing care management follow up and provided patient with direct contact information for care management team  Patient Self Care Activities:  . Able to perform most ADLs independently but requires assistance with IADLs due to dementia  Please see past updates related to this goal by clicking on the "Past Updates" button in the selected goal      . Thyroid Nodule       Current Barriers:  Marland Kitchen Knowledge Deficits related to thyroid nodule follow-up recommendations  Nurse Case Manager Clinical Goal(s):  Marland Kitchen Over the next 30 days, patient will work with RN CCM to address needs  related to thyroid nodule  Interventions:  . Talked with patient by phone . Chart reviewed including lab reports, imaging reports, communication between patient and PCP, and office notes o Cytology report was clear. No follow-up recommendations were included on report.  . Patient is content for now and does not wish to f/u with endocrinology or anyone else regarding the thyroid nodule . Advised to reach out to PCP if she notices any changes  Patient Self Care Activities:  . Performs ADL's independently . Has assistance with some ADLs and IADLs due to dementia  Initial goal documentation         Follow-up Plan:   The care management team will reach out to the patient again over the next 60 days.    [name], BSN, RN-BC Embedded Chronic Care Manager [facility] / [place] [facility] Direct Dial: [phone]

## 2019-04-01 ENCOUNTER — Other Ambulatory Visit: Payer: Self-pay | Admitting: Family Medicine

## 2019-04-03 ENCOUNTER — Other Ambulatory Visit: Payer: Self-pay | Admitting: Family Medicine

## 2019-04-14 ENCOUNTER — Telehealth: Payer: Medicare HMO

## 2019-04-26 ENCOUNTER — Ambulatory Visit: Payer: Medicare HMO | Admitting: *Deleted

## 2019-04-26 NOTE — Chronic Care Management (AMB) (Signed)
  Chronic Care Management   Outreach Note  04/26/2019 Name: Linda Fletcher MRN: [id number] DOB: 1940/08/04  Referred by: Dettinger, Fransisca Kaufmann, MD Reason for referral : Chronic Care Management (RN follow-up)   An unsuccessful telephone outreach was attempted today. The patient was referred to the case management team for assistance with care management and care coordination.   Follow Up Plan: The care management team will reach out to the patient again over the next 20 days.   [name], BSN, RN-BC Embedded Chronic Care Manager [facility] / [place] [facility] Direct Dial: [phone]

## 2019-05-16 ENCOUNTER — Telehealth: Payer: Medicare HMO

## 2019-05-22 ENCOUNTER — Ambulatory Visit: Payer: Medicare HMO | Admitting: *Deleted

## 2019-05-22 NOTE — Chronic Care Management (AMB) (Signed)
  Chronic Care Management   Follow-up Note  05/22/2019 Name: Linda Fletcher MRN: [id number] DOB: [birth year]  Referred by: Dettinger, Fransisca Kaufmann, MD Reason for referral : Chronic Care Management (RN follow up)   An unsuccessful telephone follow-up was attempted today. The patient was referred to the case management team for assistance with care management and care coordination.   Needs 3 month f/u with Evelina Dun, FNP scheduled.   Follow Up Plan: The care management team will reach out to the patient again over the next 30 days.   [name], BSN, RN-BC Embedded Chronic Care Manager [facility] [place] Management Direct Dial: [phone]

## 2019-06-08 ENCOUNTER — Other Ambulatory Visit: Payer: Self-pay | Admitting: Family Medicine

## 2019-06-08 MED ORDER — BLOOD GLUCOSE MONITOR KIT: PACK | 0 refills | Status: AC

## 2019-06-08 NOTE — Progress Notes (Signed)
Sent prescription for glucometer

## 2019-06-17 ENCOUNTER — Other Ambulatory Visit: Payer: Self-pay | Admitting: Family Medicine

## 2019-06-23 ENCOUNTER — Ambulatory Visit (INDEPENDENT_AMBULATORY_CARE_PROVIDER_SITE_OTHER): Payer: Medicare HMO | Admitting: Family Medicine

## 2019-06-23 ENCOUNTER — Encounter: Payer: Self-pay | Admitting: Family Medicine

## 2019-06-23 ENCOUNTER — Other Ambulatory Visit: Payer: Self-pay

## 2019-06-23 VITALS — BP 124/69 | HR 67 | Temp 97.0°F | Ht 64.0 in | Wt 186.4 lb

## 2019-06-23 DIAGNOSIS — E1169 Type 2 diabetes mellitus with other specified complication: Secondary | ICD-10-CM | POA: Diagnosis not present

## 2019-06-23 DIAGNOSIS — E119 Type 2 diabetes mellitus without complications: Secondary | ICD-10-CM | POA: Diagnosis not present

## 2019-06-23 DIAGNOSIS — I10 Essential (primary) hypertension: Secondary | ICD-10-CM

## 2019-06-23 DIAGNOSIS — E1159 Type 2 diabetes mellitus with other circulatory complications: Secondary | ICD-10-CM

## 2019-06-23 DIAGNOSIS — E785 Hyperlipidemia, unspecified: Secondary | ICD-10-CM

## 2019-06-23 LAB — CMP14+EGFR
ALT: 26 IU/L (ref 0–32)
AST: 28 IU/L (ref 0–40)
Albumin/Globulin Ratio: 1.6 (ref 1.2–2.2)
Albumin: 4.1 g/dL (ref 3.7–4.7)
Alkaline Phosphatase: 76 IU/L (ref 48–121)
BUN/Creatinine Ratio: 18 (ref 12–28)
BUN: 20 mg/dL (ref 8–27)
Bilirubin Total: 0.4 mg/dL (ref 0.0–1.2)
CO2: 25 mmol/L (ref 20–29)
Calcium: 9.3 mg/dL (ref 8.7–10.3)
Chloride: 99 mmol/L (ref 96–106)
Creatinine, Ser: 1.14 mg/dL — ABNORMAL HIGH (ref 0.57–1.00)
GFR calc Af Amer: 53 mL/min/1.73 — ABNORMAL LOW
GFR calc non Af Amer: 46 mL/min/1.73 — ABNORMAL LOW
Globulin, Total: 2.6 g/dL (ref 1.5–4.5)
Glucose: 162 mg/dL — ABNORMAL HIGH (ref 65–99)
Potassium: 4.2 mmol/L (ref 3.5–5.2)
Sodium: 140 mmol/L (ref 134–144)
Total Protein: 6.7 g/dL (ref 6.0–8.5)

## 2019-06-23 LAB — LIPID PANEL
Chol/HDL Ratio: 4.2 ratio (ref 0.0–4.4)
Cholesterol, Total: 182 mg/dL (ref 100–199)
HDL: 43 mg/dL (ref >39)
LDL Chol Calc (NIH): 89 mg/dL (ref 0–99)
Triglycerides: 303 mg/dL — ABNORMAL HIGH (ref 0–149)
VLDL Cholesterol Cal: 50 mg/dL — ABNORMAL HIGH (ref 5–40)

## 2019-06-23 LAB — BAYER DCA HB A1C WAIVED: HB A1C (BAYER DCA - WAIVED): 7.1 % — ABNORMAL HIGH (ref <7.0)

## 2019-06-23 NOTE — Progress Notes (Signed)
BP 124/69   Pulse 67   Temp (!) 97 F (36.1 C) (Temporal)   Ht '5\' 4"'$  (1.626 m)   Wt 186 lb 6 oz (84.5 kg)   BMI 31.99 kg/m    Subjective:   Patient ID: Linda Fletcher, female    DOB: [birth year], 79 y.o.   MRN: [id number]  HPI: Linda Fletcher is a 79 y.o. female presenting on 06/23/2019 for Medical Management of Chronic Issues   HPI Type 2 diabetes mellitus Patient comes in today for recheck of his diabetes. Patient has been currently taking Metformin 500 twice daily, her home blood sugars are running a lot higher recently 100s during the day. Patient is currently on an ACE inhibitor/ARB. Patient has seen an ophthalmologist this year. Patient denies any issues with their feet. The symptom started onset as an adult hypertension hyperlipidemia ARE RELATED TO DM   Hypertension Patient is currently on losartan and furosemide, and their blood pressure today is 124/69. Patient denies any lightheadedness or dizziness. Patient denies headaches, blurred vision, chest pains, shortness of breath, or weakness. Denies any side effects from medication and is content with current medication.   Hyperlipidemia Patient is coming in for recheck of his hyperlipidemia. The patient is currently taking lovastatin. They deny any issues with myalgias or history of liver damage from it. They deny any focal numbness or weakness or chest pain.  Patient comes in complaining of a rash along her right buttock that she says is starting go down her right leg.  She says been there for a couple months she feels like it is sore and sometimes itchy and has been getting worse recently.  Relevant past medical, surgical, family and social history reviewed and updated as indicated. Interim medical history since our last visit reviewed. Allergies and medications reviewed and updated.  Review of Systems  Constitutional: Negative for chills and fever.  Eyes: Negative for redness and visual disturbance.  Respiratory: Negative  for chest tightness and shortness of breath.   Cardiovascular: Negative for chest pain and leg swelling.  Musculoskeletal: Negative for back pain and gait problem.  Skin: Positive for rash.  Neurological: Negative for light-headedness and headaches.  Psychiatric/Behavioral: Negative for agitation and behavioral problems.  All other systems reviewed and are negative.   Per HPI unless specifically indicated above   Allergies as of 06/23/2019   No Known Allergies     Medication List       Accurate as of June 23, 2019  9:27 AM. If you have any questions, ask your nurse or doctor.        blood glucose meter kit and supplies Kit Dispense based on patient and insurance preference. Use up to four times daily as directed. (FOR ICD-9 250.00, 250.01).   calcium carbonate 600 MG Tabs tablet Commonly known as: OS-CAL Take 1 tablet (600 mg total) by mouth 2 (two) times daily with a meal.   citalopram 40 MG tablet Commonly known as: CELEXA TAKE 1 TABLET (40 MG TOTAL) BY MOUTH DAILY.   donepezil 10 MG tablet Commonly known as: ARICEPT TAKE 1 TABLET (10 MG TOTAL) BY MOUTH AT BEDTIME.   FeroSul 325 (65 FE) MG tablet Generic drug: ferrous sulfate TAKE 1 TABLET (325 MG TOTAL) BY MOUTH DAILY WITH BREAKFAST.   furosemide 40 MG tablet Commonly known as: LASIX Take 1 tablet (40 mg total) by mouth daily.   glipiZIDE 10 MG tablet Commonly known as: GLUCOTROL Take 1 tablet (10 mg total) by mouth 2 (two)  times daily.   losartan 50 MG tablet Commonly known as: COZAAR Take 1 tablet (50 mg total) by mouth daily.   lovastatin 20 MG tablet Commonly known as: MEVACOR Take 1 tablet (20 mg total) by mouth daily.   metFORMIN 500 MG tablet Commonly known as: GLUCOPHAGE TAKE 1 TABLET EVERY MORNING  AND TAKE 2 TABLETS EVERY EVENING   omeprazole 40 MG capsule Commonly known as: PRILOSEC Take 1 capsule (40 mg total) by mouth daily.   potassium chloride 10 MEQ tablet Commonly known as:  KLOR-CON Take 1 tablet (10 mEq total) by mouth daily.   PROBIOTIC DAILY PO Take 1 capsule by mouth daily.   traZODone 100 MG tablet Commonly known as: DESYREL Take 1 tablet (100 mg total) by mouth at bedtime as needed for sleep.        Objective:   There were no vitals taken for this visit.  Wt Readings from Last 3 Encounters:  01/17/19 179 lb 12.8 oz (81.6 kg)  10/03/18 182 lb 12.8 oz (82.9 kg)  10/02/18 180 lb (81.6 kg)    Physical Exam Vitals and nursing note reviewed.  Constitutional:      General: She is not in acute distress.    Appearance: She is well-developed. She is not diaphoretic.  Eyes:     Conjunctiva/sclera: Conjunctivae normal.  Cardiovascular:     Rate and Rhythm: Normal rate and regular rhythm.     Heart sounds: Normal heart sounds. No murmur heard.   Pulmonary:     Effort: Pulmonary effort is normal. No respiratory distress.     Breath sounds: Normal breath sounds. No wheezing.  Musculoskeletal:        General: No swelling or tenderness. Normal range of motion.  Skin:    General: Skin is warm and dry.     Findings: Rash (Stage I pressure sore on right buttocks going down right hamstring few small spots that are open but no signs of erythema or infection) present.  Neurological:     Mental Status: She is alert and oriented to person, place, and time.     Coordination: Coordination normal.  Psychiatric:        Behavior: Behavior normal.       Assessment & Plan:   Problem List Items Addressed This Visit      Cardiovascular and Mediastinum   Hypertension associated with diabetes ([place])   Relevant Orders   CMP14+EGFR (Completed)   Lipid panel (Completed)     Endocrine   Diabetes mellitus without complication ([place]) - Primary   Relevant Orders   Microalbumin / creatinine urine ratio (Completed)   Bayer DCA Hb A1c Waived (Completed)   CMP14+EGFR (Completed)   Lipid panel (Completed)   Hyperlipidemia associated with type 2 diabetes mellitus  ([facility])   Relevant Orders   Lipid panel (Completed)      Patient's blood sugar is up some but her A1c is 7.1, we will not make an adjustment on her medicine now but will watch it for 3 more months and see where it is at.  Instructed patient on the pressure sore that she needs to use a barrier cream such as Vaseline mycin or Eucerin or something to protect and that she needs to get up and move more frequently few small spots that are open right now but it could worsen if she continues to stay in bed Follow up plan: Return in about 3 months (around 09/23/2019), or if symptoms worsen or fail to improve, for diabetes  and htn.  Counseling provided for all of the vaccine components Orders Placed This Encounter  Procedures  . Microalbumin / creatinine urine ratio  . Bayer [facility] Hb A1c [place], MD [facility] 06/23/2019, 9:27 AM

## 2019-06-24 LAB — MICROALBUMIN / CREATININE URINE RATIO
Creatinine, Urine: 21.8 mg/dL
Microalb/Creat Ratio: <14 mg/g creat (ref 0–29)
Microalbumin, Urine: <3 ug/mL

## 2019-06-28 DIAGNOSIS — R197 Diarrhea, unspecified: Secondary | ICD-10-CM | POA: Diagnosis not present

## 2019-06-28 DIAGNOSIS — K219 Gastro-esophageal reflux disease without esophagitis: Secondary | ICD-10-CM | POA: Diagnosis not present

## 2019-06-30 ENCOUNTER — Encounter: Payer: Self-pay | Admitting: Family Medicine

## 2019-07-18 ENCOUNTER — Encounter: Payer: Self-pay | Admitting: Family Medicine

## 2019-07-21 ENCOUNTER — Ambulatory Visit (INDEPENDENT_AMBULATORY_CARE_PROVIDER_SITE_OTHER): Payer: Medicare HMO | Admitting: Nurse Practitioner

## 2019-07-21 ENCOUNTER — Other Ambulatory Visit: Payer: Self-pay

## 2019-07-21 ENCOUNTER — Encounter: Payer: Self-pay | Admitting: Nurse Practitioner

## 2019-07-21 VITALS — BP 130/71 | HR 79 | Temp 97.4°F | Ht 64.0 in | Wt 186.6 lb

## 2019-07-21 DIAGNOSIS — L89301 Pressure ulcer of unspecified buttock, stage 1: Secondary | ICD-10-CM | POA: Insufficient documentation

## 2019-07-21 DIAGNOSIS — L89321 Pressure ulcer of left buttock, stage 1: Secondary | ICD-10-CM | POA: Diagnosis not present

## 2019-07-21 DIAGNOSIS — I10 Essential (primary) hypertension: Secondary | ICD-10-CM | POA: Diagnosis not present

## 2019-07-21 DIAGNOSIS — E785 Hyperlipidemia, unspecified: Secondary | ICD-10-CM | POA: Diagnosis not present

## 2019-07-21 DIAGNOSIS — R410 Disorientation, unspecified: Secondary | ICD-10-CM | POA: Diagnosis not present

## 2019-07-21 DIAGNOSIS — E1169 Type 2 diabetes mellitus with other specified complication: Secondary | ICD-10-CM

## 2019-07-21 DIAGNOSIS — E1159 Type 2 diabetes mellitus with other circulatory complications: Secondary | ICD-10-CM | POA: Diagnosis not present

## 2019-07-21 LAB — URINALYSIS
Bilirubin, UA: NEGATIVE
Glucose, UA: NEGATIVE
Ketones, UA: NEGATIVE
Leukocytes,UA: NEGATIVE
Nitrite, UA: NEGATIVE
Protein,UA: NEGATIVE
RBC, UA: NEGATIVE
Specific Gravity, UA: 1.015 (ref 1.005–1.030)
Urobilinogen, Ur: 0.2 mg/dL (ref 0.2–1.0)
pH, UA: 5 (ref 5.0–7.5)

## 2019-07-21 MED ORDER — SILVER SULFADIAZINE 1 % EX CREA: 1.0000 "application " | TOPICAL_CREAM | Freq: Every day | CUTANEOUS | 1 refills | Status: DC

## 2019-07-21 NOTE — Assessment & Plan Note (Signed)
Patient is a 79 year old female who is following up today for mixed hyperlipidemia.  Disease process well-controlled no changes to current medication.  Patient presents with hyperlipidemia first diagnosed in 2016.  Compliance with treatment has been good.  Patient is compliant with medication, maintains a low cholesterol diet.  Follows up as directed.  Patient denies experiencing any hypercholesterolemia related symptoms.

## 2019-07-21 NOTE — Patient Instructions (Addendum)
Hyperlipidemia associated with type 2 diabetes mellitus [facility]) Patient is a 79 year old female who is following up today for mixed hyperlipidemia.  Disease process well-controlled no changes to current medication.  Patient presents with hyperlipidemia first diagnosed in 2016.  Compliance with treatment has been good.  Patient is compliant with medication, maintains a low cholesterol diet.  Follows up as directed.  Patient denies experiencing any hypercholesterolemia related symptoms.  Confusion Patient is presenting with confusion and somnolence, symptoms not well controlled.  This is new for patient and happened suddenly in the last 5 days.  Symptoms are unchanged.  Patient started shuffling feet and could not hold up her weight without staggering or almost falling after going out to a family dinner.  Primary caregiver reports that everybody ate the same food.  Nothing has changed with patient's routine, patient is not complaining of fever, nausea, headache or change in appetite. Urinalysis completed, MRI ordered, neuro,  Pressure injury of buttock, stage 1 Pressure wound on right and left buttock is gradually improving.  This has been ongoing for a few weeks.  Patient caregiver reports using topical triamcinolone.  Medication is effective but reports increased itching.  Caregiver is proactively turning patient and keeping skin dry. Provided education with printed handout given to patient caregiver to keep skin dry and keep patient turned.  Started Silvadene cream apply on clean dry skin daily. Follow-up with worsening or unresolved symptoms. Rx sent to pharmacy.   Hypersomnia Hypersomnia is a condition in which a person feels very tired during the day even though he or she gets plenty of sleep at night. A person with this condition may take naps during the day and may find it very difficult to wake up from sleep. Hypersomnia may affect a person's ability to think, concentrate, drive, or remember  things. What are the causes? The cause of this condition may not be known. Possible causes include:  Certain medicines.  Sleep disorders, such as narcolepsy and sleep apnea.  Injury to the head, brain, or spinal cord.  Drug or alcohol use.  Gastroesophageal reflux disease (GERD).  Tumors.  Certain medical conditions, such as depression, diabetes, or an underactive thyroid gland (hypothyroidism). What are the signs or symptoms? The main symptoms of hypersomnia include:  Feeling very tired throughout the day, regardless of how much sleep you got the night before.  Having trouble waking up. Others may find it difficult to wake you up when you are sleeping.  Sleeping for longer and longer periods at a time.  Taking naps throughout the day. Other symptoms may include:  Feeling restless, anxious, or annoyed.  Lacking energy.  Having trouble with: ? Remembering. ? Speaking. ? Thinking.  Loss of appetite.  Seeing, hearing, tasting, smelling, or feeling things that are not real (hallucinations). How is this diagnosed? This condition may be diagnosed based on:  Your symptoms and medical history.  Your sleeping habits. Your health care provider may ask you to write down your sleeping habits in a daily sleep log, along with any symptoms you have.  A series of tests that are done while you sleep (sleep study or polysomnogram).  A test that measures how quickly you can fall asleep during the day (daytime nap study or multiple sleep latency test). How is this treated? Treatment can help you manage your condition. Treatment may include:  Following a regular sleep routine.  Lifestyle changes, such as changing your eating habits, getting regular exercise, and avoiding alcohol or caffeinated beverages.  Taking medicines  to make you more alert (stimulants) during the day.  Treating any underlying medical causes of hypersomnia. Follow these instructions at home: Sleep  routine   Schedule the same bedtime and wake-up time each day.  Practice a relaxing bedtime routine. This may include reading, meditation, deep breathing, or taking a warm bath before going to sleep.  Get regular exercise each day. Avoid strenuous exercise in the evening hours.  Keep your sleep environment at a cooler temperature, darkened, and quiet.  Sleep with pillows and a mattress that are comfortable and supportive.  Schedule short 20-minute naps for when you feel sleepiest during the day.  Talk with your employer or teachers about your hypersomnia. If possible, adjust your schedule so that: ? You have a regular daytime work schedule. ? You can take a scheduled nap during the day. ? You do not have to work or be active at night.  Do not eat a heavy meal for a few hours before bedtime. Eat your meals at about the same times every day.  Avoid drinking alcohol or caffeinated beverages. Safety   Do not drive or use heavy machinery if you are sleepy. Ask your health care provider if it is safe for you to drive.  Wear a life jacket when swimming or spending time near water. General instructions  Take supplements and over-the-counter and prescription medicines only as told by your health care provider.  Keep a sleep log that will help your doctor manage your condition. This may include information about: ? What time you go to bed each night. ? How often you wake up at night. ? How many hours you sleep at night. ? How often and for how long you nap during the day. ? Any observations from others, such as leg movements during sleep, sleep walking, or snoring.  Keep all follow-up visits as told by your health care provider. This is important. Contact a health care provider if:  You have new symptoms.  Your symptoms get worse. Get help right away if:  You have serious thoughts about hurting yourself or someone else. If you ever feel like you may hurt yourself or others, or  have thoughts about taking your own life, get help right away. You can go to your nearest emergency department or call:  Your local emergency services (911 in the [place]  A suicide crisis helpline, such as the [place] at (434)002-0397. This is open 24 hours a day. Summary  Hypersomnia refers to a condition in which you feel very tired during the day even though you get plenty of sleep at night.  A person with this condition may take naps during the day and may find it very difficult to wake up from sleep.  Hypersomnia may affect a person's ability to think, concentrate, drive, or remember things.  Treatment, such as following a regular sleep routine and making some lifestyle changes, can help you manage your condition. This information is not intended to replace advice given to you by your health care provider. Make sure you discuss any questions you have with your health care provider. Document Revised: 12/31/2016 Document Reviewed: 12/31/2016 Elsevier Patient Education  2020 [employer].

## 2019-07-21 NOTE — Assessment & Plan Note (Signed)
Patient is presenting with confusion and somnolence, symptoms not well controlled.  This is new for patient and happened suddenly in the last 5 days.  Symptoms are unchanged.  Patient started shuffling feet and could not hold up her weight without staggering or almost falling after going out to a family dinner.  Primary caregiver reports that everybody ate the same food.  Nothing has changed with patient's routine, patient is not complaining of fever, nausea, headache or change in appetite. Urinalysis completed, MRI ordered, neuro,

## 2019-07-21 NOTE — Progress Notes (Signed)
Established Patient Office Visit  Subjective:  Patient ID: Linda Fletcher, female    DOB: 21-Jan-1940  Age: 79 y.o. MRN: [id number]  CC:  Chief Complaint  Patient presents with  . balance    shuffling while walking last 4-5 days, balance issues  . bed sore    left buttocks    HPI Linda Fletcher presents for Mixed hyperlipidemia. Patient was diagnosed in 2016. Compliance with treatment has been good; The patient is compliant with medications, maintains a low cholesterol diet , follows up as directed. The patient denies experiencing any hypercholesterolemia related symptoms.    Pressure wound right and left buttock.  This is not new for patient.  This has been ongoing for a few weeks. patient caregiver reports using topical triamcinolone, effective but reports increased itching.  Patient reports skin is gradually improving with minimal redness and skin irritation.  Caregiver reports proactively turning patient and keeping skin dry.   Confusion/somlenence patient caregiver reports new symptoms of increased lethargy.  Symptoms started on Sunday after lunch in a restaurant.  Patient started shuffling feet and could not hold up her weight without staggering or almost falling.  Nothing changed with  patient's diet or routine.  No fevers, no nausea, no headache or change in appetite.     Past Medical History:  Diagnosis Date  . Arthritis   . Dementia ([place])   . Depression   . Diabetes mellitus without complication ([place])   . GERD (gastroesophageal reflux disease)   . Glaucoma   . Hx of adenomatous colonic polyps   . Hyperlipidemia   . Hypertension   . Obesity     Past Surgical History:  Procedure Laterality Date  . ABDOMINAL HYSTERECTOMY    . CHOLECYSTECTOMY    . THYROID CYST EXCISION    . TONSILLECTOMY AND ADENOIDECTOMY     age 16    Family History  Problem Relation Age of Onset  . Arthritis Mother   . Diabetes Mother   . Diabetes Son   . Hypertension Son   . Hyperlipidemia  Son   . Hypertension Daughter     Social History   Socioeconomic History  . Marital status: Widowed    Spouse name: Not on file  . Number of children: 3  . Years of education: Not on file  . Highest education level: 3rd grade  Occupational History  . Occupation: Retired     Comment: Regulatory affairs officer  Tobacco Use  . Smoking status: Former Smoker    Quit date: 07/18/1974    Years since quitting: 45.0  . Smokeless tobacco: Never Used  Vaping Use  . Vaping Use: Never used  Substance and Sexual Activity  . Alcohol use: No  . Drug use: No  . Sexual activity: Not Currently  Other Topics Concern  . Not on file  Social History Narrative   Lives with son and daughter in law. Cannot read very well due to 3rd grade education. Son helps with reading when needed.   Social Determinants of Health   Financial Resource Strain:   . Difficulty of Paying Living Expenses:   Food Insecurity:   . Worried About Charity fundraiser in the Last Year:   . Arboriculturist in the Last Year:   Transportation Needs:   . Film/video editor (Medical):   Marland Kitchen Lack of Transportation (Non-Medical):   Physical Activity:   . Days of Exercise per Week:   . Minutes of Exercise per Session:   Stress:  Stress Concern Present  . Feeling of Stress : To some extent  Social Connections: Unknown  . Frequency of Communication with Friends and Family: Not on file  . Frequency of Social Gatherings with Friends and Family: Not on file  . Attends Religious Services: Not on file  . Active Member of Clubs or Organizations: No  . Attends Archivist Meetings: Never  . Marital Status: Not on file  Intimate Partner Violence:   . Fear of Current or Ex-Partner:   . Emotionally Abused:   Marland Kitchen Physically Abused:   . Sexually Abused:     Outpatient Medications Prior to Visit  Medication Sig Dispense Refill  . blood glucose meter kit and supplies KIT Dispense based on patient and insurance preference. Use up to four  times daily as directed. (FOR ICD-9 250.00, 250.01). 1 each 0  . calcium carbonate (OS-CAL) 600 MG TABS tablet Take 1 tablet (600 mg total) by mouth 2 (two) times daily with a meal. 180 tablet 3  . citalopram (CELEXA) 40 MG tablet TAKE 1 TABLET (40 MG TOTAL) BY MOUTH DAILY. 90 tablet 0  . donepezil (ARICEPT) 10 MG tablet TAKE 1 TABLET (10 MG TOTAL) BY MOUTH AT BEDTIME. 90 tablet 0  . FEROSUL 325 (65 Fe) MG tablet TAKE 1 TABLET (325 MG TOTAL) BY MOUTH DAILY WITH BREAKFAST. 90 tablet 0  . furosemide (LASIX) 40 MG tablet Take 1 tablet (40 mg total) by mouth daily. 90 tablet 3  . glipiZIDE (GLUCOTROL) 10 MG tablet Take 1 tablet (10 mg total) by mouth 2 (two) times daily. 180 tablet 3  . losartan (COZAAR) 50 MG tablet Take 1 tablet (50 mg total) by mouth daily. 90 tablet 3  . lovastatin (MEVACOR) 20 MG tablet Take 1 tablet (20 mg total) by mouth daily. 90 tablet 3  . metFORMIN (GLUCOPHAGE) 500 MG tablet TAKE 1 TABLET EVERY MORNING  AND TAKE 2 TABLETS EVERY EVENING 270 tablet 3  . omeprazole (PRILOSEC) 40 MG capsule Take 1 capsule (40 mg total) by mouth daily. 90 capsule 3  . potassium chloride (KLOR-CON) 10 MEQ tablet Take 1 tablet (10 mEq total) by mouth daily. 90 tablet 1  . Probiotic Product (PROBIOTIC DAILY PO) Take 1 capsule by mouth daily.     . traZODone (DESYREL) 100 MG tablet Take 1 tablet (100 mg total) by mouth at bedtime as needed for sleep. 90 tablet 3   No facility-administered medications prior to visit.    No Known Allergies  ROS Review of Systems  Constitutional: Negative for fatigue, fever and unexpected weight change.  HENT: Negative.   Respiratory: Negative.   Cardiovascular: Negative.   Gastrointestinal: Negative.   Genitourinary: Negative.   Skin: Negative.   Neurological: Positive for speech difficulty and weakness.  Psychiatric/Behavioral: Positive for confusion.      Objective:    Physical Exam Constitutional:      Appearance: Normal appearance.  HENT:      Nose: Nose normal.  Eyes:     Conjunctiva/sclera: Conjunctivae normal.  Cardiovascular:     Rate and Rhythm: Normal rate.     Pulses: Normal pulses.     Heart sounds: Normal heart sounds.  Pulmonary:     Effort: Pulmonary effort is normal.     Breath sounds: Normal breath sounds.  Abdominal:     General: Bowel sounds are normal.  Skin:    General: Skin is warm.  Neurological:     Mental Status: She is alert and oriented to  person, place, and time.     Motor: Weakness present.     Coordination: Coordination abnormal.     Gait: Gait abnormal.     BP 130/71   Pulse 79   Temp (!) 97.4 F (36.3 C) (Temporal)   Ht 5' 4" (1.626 m)   Wt 186 lb 9.6 oz (84.6 kg)   BMI 32.03 kg/m  Wt Readings from Last 3 Encounters:  07/21/19 186 lb 9.6 oz (84.6 kg)  06/23/19 186 lb 6 oz (84.5 kg)  01/17/19 179 lb 12.8 oz (81.6 kg)     There are no preventive care reminders to display for this patient.  There are no preventive care reminders to display for this patient.  Lab Results  Component Value Date   TSH 4.390 01/17/2019   Lab Results  Component Value Date   WBC 9.3 01/17/2019   HGB 13.5 01/17/2019   HCT 40.0 01/17/2019   MCV 84 01/17/2019   PLT 183 01/17/2019   Lab Results  Component Value Date   NA 140 06/23/2019   K 4.2 06/23/2019   CO2 25 06/23/2019   GLUCOSE 162 (H) 06/23/2019   BUN 20 06/23/2019   CREATININE 1.14 (H) 06/23/2019   BILITOT 0.4 06/23/2019   ALKPHOS 76 06/23/2019   AST 28 06/23/2019   ALT 26 06/23/2019   PROT 6.7 06/23/2019   ALBUMIN 4.1 06/23/2019   CALCIUM 9.3 06/23/2019   ANIONGAP 8 10/03/2018   Lab Results  Component Value Date   CHOL 182 06/23/2019   Lab Results  Component Value Date   HDL 43 06/23/2019   Lab Results  Component Value Date   LDLCALC 89 06/23/2019   Lab Results  Component Value Date   TRIG 303 (H) 06/23/2019   Lab Results  Component Value Date   CHOLHDL 4.2 06/23/2019   Lab Results  Component Value Date    HGBA1C 7.1 (H) 06/23/2019      Assessment & Plan:   Problem List Items Addressed This Visit      Cardiovascular and Mediastinum   Hypertension associated with diabetes ([place])     Endocrine   Hyperlipidemia associated with type 2 diabetes mellitus ([place])    Patient is a 79 year old female who is following up today for mixed hyperlipidemia.  Disease process well-controlled no changes to current medication.  Patient presents with hyperlipidemia first diagnosed in 2016.  Compliance with treatment has been good.  Patient is compliant with medication, maintains a low cholesterol diet.  Follows up as directed.  Patient denies experiencing any hypercholesterolemia related symptoms.        Nervous and Auditory   Confusion - Primary    Patient is presenting with confusion and somnolence, symptoms not well controlled.  This is new for patient and happened suddenly in the last 5 days.  Symptoms are unchanged.  Patient started shuffling feet and could not hold up her weight without staggering or almost falling after going out to a family dinner.  Primary caregiver reports that everybody ate the same food.  Nothing has changed with patient's routine, patient is not complaining of fever, nausea, headache or change in appetite. Urinalysis completed, MRI ordered, neuro,      Relevant Orders   Urinalysis (Completed)   MR Brain W Wo Contrast     Musculoskeletal and Integument   Pressure injury of buttock, stage 1    Pressure wound on right and left buttock is gradually improving.  This has been ongoing for a few weeks.  Patient caregiver reports using topical triamcinolone.  Medication is effective but reports increased itching.  Caregiver is proactively turning patient and keeping skin dry. Provided education with printed handout given to patient caregiver to keep skin dry and keep patient turned.  Started Silvadene cream apply on clean dry skin daily. Follow-up with worsening or unresolved symptoms. Rx  sent to pharmacy.      Relevant Medications   silver sulfADIAZINE (SILVADENE) 1 % cream      Meds ordered this encounter  Medications  . silver sulfADIAZINE (SILVADENE) 1 % cream    Sig: Apply 1 application topically daily.    Dispense:  50 g    Refill:  1    Order Specific Question:   Supervising Provider    Answer:   Caryl Pina A [[id number]]    Follow-up: Return if symptoms worsen or fail to improve.    Ivy Lynn, NP

## 2019-07-21 NOTE — Assessment & Plan Note (Signed)
Pressure wound on right and left buttock is gradually improving.  This has been ongoing for a few weeks.  Patient caregiver reports using topical triamcinolone.  Medication is effective but reports increased itching.  Caregiver is proactively turning patient and keeping skin dry. Provided education with printed handout given to patient caregiver to keep skin dry and keep patient turned.  Started Silvadene cream apply on clean dry skin daily. Follow-up with worsening or unresolved symptoms. Rx sent to pharmacy.

## 2019-07-26 ENCOUNTER — Encounter: Payer: Self-pay | Admitting: Family Medicine

## 2019-07-31 DIAGNOSIS — M79674 Pain in right toe(s): Secondary | ICD-10-CM | POA: Diagnosis not present

## 2019-07-31 DIAGNOSIS — M205X1 Other deformities of toe(s) (acquired), right foot: Secondary | ICD-10-CM | POA: Diagnosis not present

## 2019-07-31 DIAGNOSIS — M2041 Other hammer toe(s) (acquired), right foot: Secondary | ICD-10-CM | POA: Diagnosis not present

## 2019-07-31 DIAGNOSIS — M2042 Other hammer toe(s) (acquired), left foot: Secondary | ICD-10-CM | POA: Diagnosis not present

## 2019-07-31 DIAGNOSIS — L602 Onychogryphosis: Secondary | ICD-10-CM | POA: Diagnosis not present

## 2019-08-07 ENCOUNTER — Other Ambulatory Visit: Payer: Self-pay | Admitting: Family Medicine

## 2019-08-09 ENCOUNTER — Other Ambulatory Visit: Payer: Self-pay | Admitting: Family Medicine

## 2019-08-11 ENCOUNTER — Other Ambulatory Visit: Payer: Self-pay | Admitting: Family Medicine

## 2019-08-18 ENCOUNTER — Ambulatory Visit (HOSPITAL_COMMUNITY): Payer: Medicare HMO

## 2019-08-31 ENCOUNTER — Other Ambulatory Visit: Payer: Self-pay | Admitting: Family Medicine

## 2019-09-20 DIAGNOSIS — H401131 Primary open-angle glaucoma, bilateral, mild stage: Secondary | ICD-10-CM | POA: Diagnosis not present

## 2019-09-26 ENCOUNTER — Ambulatory Visit: Payer: Medicare HMO | Admitting: Family Medicine

## 2019-10-19 ENCOUNTER — Other Ambulatory Visit: Payer: Self-pay | Admitting: Family Medicine

## 2019-10-19 NOTE — Telephone Encounter (Signed)
Dettinger NTBS for 3 mos (Sept) ckup. Mail order sent

## 2019-10-21 ENCOUNTER — Other Ambulatory Visit: Payer: Self-pay | Admitting: Family Medicine

## 2019-11-11 ENCOUNTER — Other Ambulatory Visit: Payer: Self-pay | Admitting: Family Medicine

## 2019-11-11 DIAGNOSIS — K219 Gastro-esophageal reflux disease without esophagitis: Secondary | ICD-10-CM

## 2019-11-11 DIAGNOSIS — E119 Type 2 diabetes mellitus without complications: Secondary | ICD-10-CM

## 2019-11-13 ENCOUNTER — Other Ambulatory Visit: Payer: Self-pay | Admitting: Family Medicine

## 2019-11-13 NOTE — Telephone Encounter (Signed)
Dettinger. NTBS 3 mos ckup was to be September. Mail order sent

## 2019-12-24 ENCOUNTER — Other Ambulatory Visit: Payer: Self-pay | Admitting: Family Medicine

## 2019-12-24 DIAGNOSIS — F339 Major depressive disorder, recurrent, unspecified: Secondary | ICD-10-CM

## 2020-01-01 ENCOUNTER — Other Ambulatory Visit: Payer: Self-pay | Admitting: Family Medicine

## 2020-01-01 NOTE — Telephone Encounter (Signed)
Dettinger NTBS time for 6 mos ckup. Mail order not sent

## 2020-01-02 NOTE — Telephone Encounter (Signed)
Appointment scheduled.

## 2020-01-05 ENCOUNTER — Other Ambulatory Visit: Payer: Self-pay | Admitting: Family Medicine

## 2020-01-22 ENCOUNTER — Ambulatory Visit (INDEPENDENT_AMBULATORY_CARE_PROVIDER_SITE_OTHER): Payer: Medicare HMO | Admitting: Family Medicine

## 2020-01-22 ENCOUNTER — Other Ambulatory Visit: Payer: Self-pay

## 2020-01-22 ENCOUNTER — Encounter: Payer: Self-pay | Admitting: Family Medicine

## 2020-01-22 VITALS — BP 147/78 | HR 75 | Ht 64.0 in | Wt 186.0 lb

## 2020-01-22 DIAGNOSIS — E119 Type 2 diabetes mellitus without complications: Secondary | ICD-10-CM | POA: Diagnosis not present

## 2020-01-22 DIAGNOSIS — E785 Hyperlipidemia, unspecified: Secondary | ICD-10-CM

## 2020-01-22 DIAGNOSIS — I152 Hypertension secondary to endocrine disorders: Secondary | ICD-10-CM | POA: Diagnosis not present

## 2020-01-22 DIAGNOSIS — K219 Gastro-esophageal reflux disease without esophagitis: Secondary | ICD-10-CM | POA: Diagnosis not present

## 2020-01-22 DIAGNOSIS — E1169 Type 2 diabetes mellitus with other specified complication: Secondary | ICD-10-CM

## 2020-01-22 DIAGNOSIS — E1159 Type 2 diabetes mellitus with other circulatory complications: Secondary | ICD-10-CM

## 2020-01-22 LAB — BAYER DCA HB A1C WAIVED: HB A1C (BAYER DCA - WAIVED): 6.7 % (ref <7.0)

## 2020-01-22 MED ORDER — OMEPRAZOLE 40 MG PO CPDR: 40.0000 mg | DELAYED_RELEASE_CAPSULE | Freq: Every day | ORAL | 3 refills | Status: DC

## 2020-01-22 MED ORDER — DONEPEZIL HCL 10 MG PO TABS: 10.0000 mg | ORAL_TABLET | Freq: Every day | ORAL | 3 refills | Status: DC

## 2020-01-22 MED ORDER — POTASSIUM CHLORIDE ER 10 MEQ PO TBCR: 10.0000 meq | EXTENDED_RELEASE_TABLET | Freq: Every day | ORAL | 3 refills | Status: DC

## 2020-01-22 MED ORDER — LOVASTATIN 20 MG PO TABS: 20.0000 mg | ORAL_TABLET | Freq: Every day | ORAL | 3 refills | Status: DC

## 2020-01-22 MED ORDER — GLIPIZIDE 10 MG PO TABS: 10.0000 mg | ORAL_TABLET | Freq: Two times a day (BID) | ORAL | 3 refills | Status: DC

## 2020-01-22 MED ORDER — FUROSEMIDE 40 MG PO TABS: 40.0000 mg | ORAL_TABLET | Freq: Every day | ORAL | 3 refills | Status: DC

## 2020-01-22 MED ORDER — METFORMIN HCL 500 MG PO TABS: ORAL_TABLET | ORAL | 3 refills | Status: DC

## 2020-01-22 MED ORDER — FERROUS SULFATE 325 (65 FE) MG PO TABS
325.0000 mg | ORAL_TABLET | Freq: Every day | ORAL | 3 refills | Status: DC
Start: 2020-01-22 — End: 2020-09-13

## 2020-01-22 MED ORDER — CITALOPRAM HYDROBROMIDE 40 MG PO TABS: 40.0000 mg | ORAL_TABLET | Freq: Every day | ORAL | 3 refills | Status: DC

## 2020-01-22 MED ORDER — LOSARTAN POTASSIUM 50 MG PO TABS: 50.0000 mg | ORAL_TABLET | Freq: Every day | ORAL | 3 refills | Status: DC

## 2020-01-22 NOTE — Progress Notes (Signed)
BP (!) 147/78   Pulse 75   Ht _0  (1.626 m)   Wt 186 lb (84.4 kg)   SpO2 99%   BMI 31.93 kg/m    Subjective:   Patient ID: Linda Fletcher, female    DOB: 1940-08-08, 80 y.o.   MRN: [id number]  HPI: Linda Fletcher is a 80 y.o. female presenting on 01/22/2020 for Medical Management of Chronic Issues, Diabetes, Hypertension, and Hyperlipidemia   HPI Type 2 diabetes mellitus Patient comes in today for recheck of his diabetes. Patient has been currently taking metformin and glipizide, still having diarrhea, will stop metformin, A1c is 6.7.. Patient is currently on an ACE inhibitor/ARB. Patient has seen an ophthalmologist this year. Patient denies any issues with their feet. The symptom started onset as an adult hypertension hyperlipidemia ARE RELATED TO DM   Hypertension Patient is currently on losartan, and their blood pressure today is 147/78. Patient denies any lightheadedness or dizziness. Patient denies headaches, blurred vision, chest pains, shortness of breath, or weakness. Denies any side effects from medication and is content with current medication.   Hyperlipidemia Patient is coming in for recheck of his hyperlipidemia. The patient is currently taking lovastatin. They deny any issues with myalgias or history of liver damage from it. They deny any focal numbness or weakness or chest pain.   Relevant past medical, surgical, family and social history reviewed and updated as indicated. Interim medical history since our last visit reviewed. Allergies and medications reviewed and updated.  Review of Systems  Constitutional: Negative for chills and fever.  Eyes: Negative for visual disturbance.  Respiratory: Negative for chest tightness and shortness of breath.   Cardiovascular: Negative for chest pain and leg swelling.  Gastrointestinal: Negative for abdominal pain.  Musculoskeletal: Negative for back pain and gait problem.  Skin: Negative for rash.  Neurological: Negative for  light-headedness and headaches.  Psychiatric/Behavioral: Negative for agitation and behavioral problems.  All other systems reviewed and are negative.   Per HPI unless specifically indicated above   Allergies as of 01/22/2020   No Known Allergies     Medication List       Accurate as of January 22, 2020  9:09 AM. If you have any questions, ask your nurse or doctor.        STOP taking these medications   silver sulfADIAZINE 1 % cream Commonly known as: SILVADENE Stopped by: Worthy Rancher, MD     TAKE these medications   blood glucose meter kit and supplies Kit Dispense based on patient and insurance preference. Use up to four times daily as directed. (FOR ICD-9 250.00, 250.01).   calcium carbonate 600 MG Tabs tablet Commonly known as: OS-CAL Take 1 tablet (600 mg total) by mouth 2 (two) times daily with a meal.   citalopram 40 MG tablet Commonly known as: CELEXA TAKE 1 TABLET (40 MG TOTAL) BY MOUTH DAILY.   donepezil 10 MG tablet Commonly known as: ARICEPT TAKE 1 TABLET (10 MG TOTAL) BY MOUTH AT BEDTIME.   FeroSul 325 (65 FE) MG tablet Generic drug: ferrous sulfate TAKE 1 TABLET (325 MG TOTAL) BY MOUTH DAILY WITH BREAKFAST.   furosemide 40 MG tablet Commonly known as: LASIX TAKE 1 TABLET (40 MG TOTAL) BY MOUTH DAILY.   glipiZIDE 10 MG tablet Commonly known as: GLUCOTROL TAKE 1 TABLET TWICE DAILY   losartan 50 MG tablet Commonly known as: COZAAR TAKE 1 TABLET (50 MG TOTAL) BY MOUTH DAILY.   lovastatin 20 MG tablet Commonly  known as: MEVACOR TAKE 1 TABLET (20 MG TOTAL) BY MOUTH DAILY.   metFORMIN 500 MG tablet Commonly known as: GLUCOPHAGE TAKE 1 TABLET EVERY MORNING  AND TAKE 2 TABLETS EVERY EVENING   omeprazole 40 MG capsule Commonly known as: PRILOSEC TAKE 1 CAPSULE (40 MG TOTAL) BY MOUTH DAILY.   potassium chloride 10 MEQ tablet Commonly known as: KLOR-CON TAKE 1 TABLET EVERY DAY   PROBIOTIC DAILY PO Take 1 capsule by mouth daily.    traZODone 100 MG tablet Commonly known as: DESYREL TAKE 1 TABLET (100 MG TOTAL) BY MOUTH AT BEDTIME AS NEEDED FOR SLEEP.   True Metrix Blood Glucose Test test strip Generic drug: glucose blood Check BS up to 4 times daily Dx E11.9        Objective:   BP (!) 147/78   Pulse 75   Ht _0  (1.626 m)   Wt 186 lb (84.4 kg)   SpO2 99%   BMI 31.93 kg/m   Wt Readings from Last 3 Encounters:  01/22/20 186 lb (84.4 kg)  07/21/19 186 lb 9.6 oz (84.6 kg)  06/23/19 186 lb 6 oz (84.5 kg)    Physical Exam Vitals and nursing note reviewed.  Constitutional:      General: She is not in acute distress.    Appearance: She is well-developed and well-nourished. She is not diaphoretic.  Eyes:     Extraocular Movements: EOM normal.     Conjunctiva/sclera: Conjunctivae normal.  Cardiovascular:     Rate and Rhythm: Normal rate and regular rhythm.     Pulses: Intact distal pulses.     Heart sounds: Normal heart sounds. No murmur heard.   Pulmonary:     Effort: Pulmonary effort is normal. No respiratory distress.     Breath sounds: Normal breath sounds. No wheezing.  Musculoskeletal:        General: No tenderness or edema. Normal range of motion.  Skin:    General: Skin is warm and dry.     Findings: No rash.  Neurological:     Mental Status: She is alert and oriented to person, place, and time.     Coordination: Coordination normal.  Psychiatric:        Mood and Affect: Mood and affect normal.        Behavior: Behavior normal.       Assessment & Plan:   Problem List Items Addressed This Visit      Cardiovascular and Mediastinum   Hypertension associated with diabetes ([place]) - Primary   Relevant Medications   furosemide (LASIX) 40 MG tablet   glipiZIDE (GLUCOTROL) 10 MG tablet   losartan (COZAAR) 50 MG tablet   lovastatin (MEVACOR) 20 MG tablet   Other Relevant Orders   CBC with Differential/Platelet   CMP14+EGFR   Lipid panel   Bayer DCA Hb A1c Waived     Digestive    GERD (gastroesophageal reflux disease)   Relevant Medications   omeprazole (PRILOSEC) 40 MG capsule     Endocrine   Diabetes mellitus without complication ([facility])   Relevant Medications   glipiZIDE (GLUCOTROL) 10 MG tablet   losartan (COZAAR) 50 MG tablet   lovastatin (MEVACOR) 20 MG tablet   Other Relevant Orders   CBC with Differential/Platelet   CMP14+EGFR   Lipid panel   Bayer DCA Hb A1c Waived   TSH   Hyperlipidemia associated with type 2 diabetes mellitus ([facility])   Relevant Medications   glipiZIDE (GLUCOTROL) 10 MG tablet   losartan (COZAAR)  50 MG tablet   lovastatin (MEVACOR) 20 MG tablet   Other Relevant Orders   CBC with Differential/Platelet   CMP14+EGFR   Lipid panel   Bayer DCA Hb A1c Waived      Continue current medication except we will stop metformin because of diarrhea and see how her blood sugars run and see if the diarrhea gets better. Follow up plan: Return in about 3 months (around 04/21/2020), or if symptoms worsen or fail to improve, for Hypertension and GERD and diabetes.  Counseling provided for all of the vaccine components Orders Placed This Encounter  Procedures  . CBC with Differential/Platelet  . CMP14+EGFR  . Lipid panel  . Bayer DCA Hb A1c Waived  . TSH    Caryl Pina, MD [facility] 01/22/2020, 9:09 AM

## 2020-01-23 LAB — CBC WITH DIFFERENTIAL/PLATELET
Basophils Absolute: 0 10*3/uL (ref 0.0–0.2)
Basos: 0 %
EOS (ABSOLUTE): 0.1 10*3/uL (ref 0.0–0.4)
Eos: 2 %
Hematocrit: 42 % (ref 34.0–46.6)
Hemoglobin: 14.1 g/dL (ref 11.1–15.9)
Immature Grans (Abs): 0 10*3/uL (ref 0.0–0.1)
Immature Granulocytes: 0 %
Lymphocytes Absolute: 2 10*3/uL (ref 0.7–3.1)
Lymphs: 25 %
MCH: 29.9 pg (ref 26.6–33.0)
MCHC: 33.6 g/dL (ref 31.5–35.7)
MCV: 89 fL (ref 79–97)
Monocytes Absolute: 0.6 10*3/uL (ref 0.1–0.9)
Monocytes: 7 %
Neutrophils Absolute: 5.2 10*3/uL (ref 1.4–7.0)
Neutrophils: 66 %
Platelets: 183 10*3/uL (ref 150–450)
RBC: 4.72 x10E6/uL (ref 3.77–5.28)
RDW: 12.8 % (ref 11.7–15.4)
WBC: 8 10*3/uL (ref 3.4–10.8)

## 2020-01-23 LAB — CMP14+EGFR
ALT: 30 IU/L (ref 0–32)
AST: 27 IU/L (ref 0–40)
Albumin/Globulin Ratio: 1.7 (ref 1.2–2.2)
Albumin: 4.2 g/dL (ref 3.7–4.7)
Alkaline Phosphatase: 77 IU/L (ref 44–121)
BUN/Creatinine Ratio: 17 (ref 12–28)
BUN: 18 mg/dL (ref 8–27)
Bilirubin Total: 0.3 mg/dL (ref 0.0–1.2)
CO2: 29 mmol/L (ref 20–29)
Calcium: 9.5 mg/dL (ref 8.7–10.3)
Chloride: 95 mmol/L — ABNORMAL LOW (ref 96–106)
Creatinine, Ser: 1.07 mg/dL — ABNORMAL HIGH (ref 0.57–1.00)
GFR calc Af Amer: 57 mL/min/{1.73_m2} — ABNORMAL LOW (ref >59)
GFR calc non Af Amer: 49 mL/min/{1.73_m2} — ABNORMAL LOW (ref >59)
Globulin, Total: 2.5 g/dL (ref 1.5–4.5)
Glucose: 153 mg/dL — ABNORMAL HIGH (ref 65–99)
Potassium: 3.3 mmol/L — ABNORMAL LOW (ref 3.5–5.2)
Sodium: 144 mmol/L (ref 134–144)
Total Protein: 6.7 g/dL (ref 6.0–8.5)

## 2020-01-23 LAB — TSH: TSH: 4.05 u[IU]/mL (ref 0.450–4.500)

## 2020-01-23 LAB — LIPID PANEL
Chol/HDL Ratio: 3.6 ratio (ref 0.0–4.4)
Cholesterol, Total: 171 mg/dL (ref 100–199)
HDL: 47 mg/dL (ref >39)
LDL Chol Calc (NIH): 88 mg/dL (ref 0–99)
Triglycerides: 217 mg/dL — ABNORMAL HIGH (ref 0–149)
VLDL Cholesterol Cal: 36 mg/dL (ref 5–40)

## 2020-01-27 ENCOUNTER — Other Ambulatory Visit: Payer: Self-pay

## 2020-01-27 MED ORDER — POTASSIUM CHLORIDE ER 20 MEQ PO TBCR: 20.0000 meq | EXTENDED_RELEASE_TABLET | Freq: Every day | ORAL | 1 refills | Status: DC

## 2020-01-31 ENCOUNTER — Encounter: Payer: Self-pay | Admitting: Family Medicine

## 2020-02-01 MED ORDER — RYBELSUS 7 MG PO TABS: 7.0000 mg | ORAL_TABLET | Freq: Every day | ORAL | 3 refills | Status: DC

## 2020-03-20 DIAGNOSIS — E119 Type 2 diabetes mellitus without complications: Secondary | ICD-10-CM | POA: Diagnosis not present

## 2020-03-20 DIAGNOSIS — H401131 Primary open-angle glaucoma, bilateral, mild stage: Secondary | ICD-10-CM | POA: Diagnosis not present

## 2020-03-20 DIAGNOSIS — M069 Rheumatoid arthritis, unspecified: Secondary | ICD-10-CM | POA: Diagnosis not present

## 2020-03-21 LAB — HM DIABETES EYE EXAM

## 2020-03-22 ENCOUNTER — Other Ambulatory Visit: Payer: Self-pay | Admitting: Family Medicine

## 2020-03-26 IMAGING — CT CT CERVICAL SPINE W/O CM
4 of 7 series · 14 of 33 positions shown, 15 images · non-contrast
Comparison: None.

CLINICAL DATA: Fall, loss of consciousness

EXAM:
CT HEAD WITHOUT CONTRAST; CT CERVICAL SPINE WITHOUT CONTRAST
TECHNIQUE: Contiguous axial images were obtained from the base of the skull
through the vertex without intravenous contrast.

[Series 8: c spine soft · axial · 0.31mm/px · z∈[+913,+1021]mm · 4 of 92 slices shown]
[im 19/92  soft-tissue]
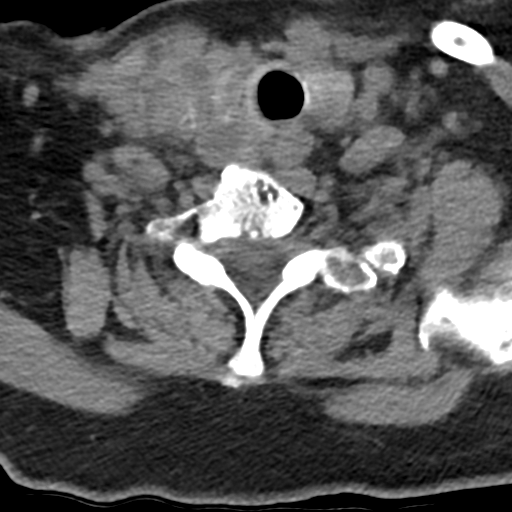
[im 37/92  soft-tissue]
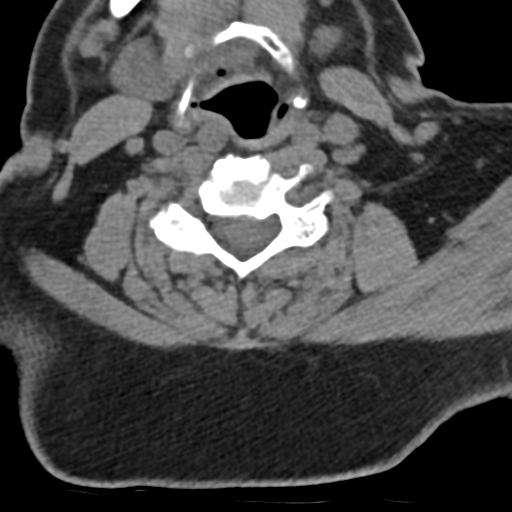
[im 55/92  soft-tissue]
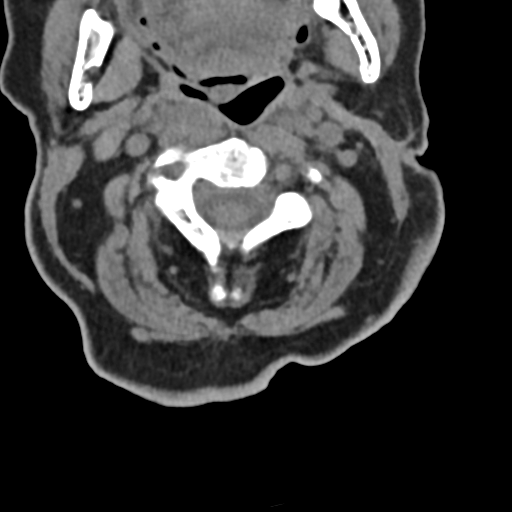
[im 73/92  soft-tissue]
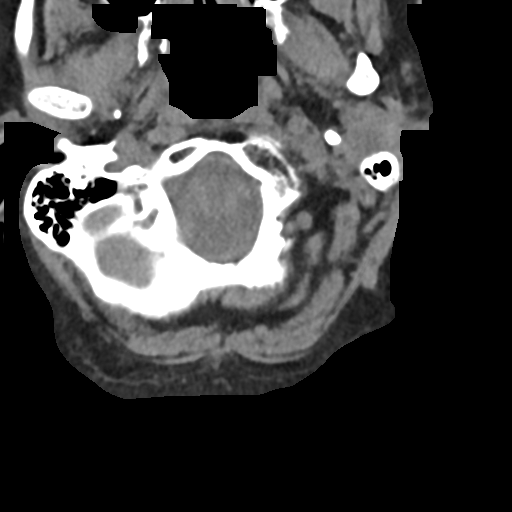

[Series 9: sagittal bone · sagittal · 0.27mm/px · 5 of 61 slices shown]
[im 11/61  bone]
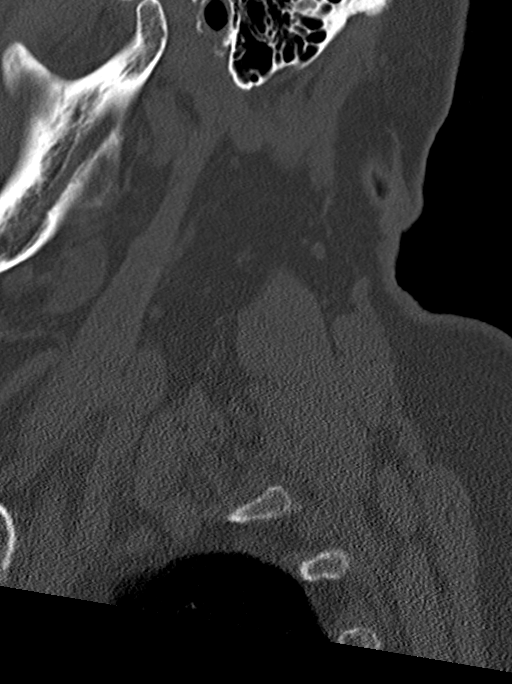
[im 21/61  bone]
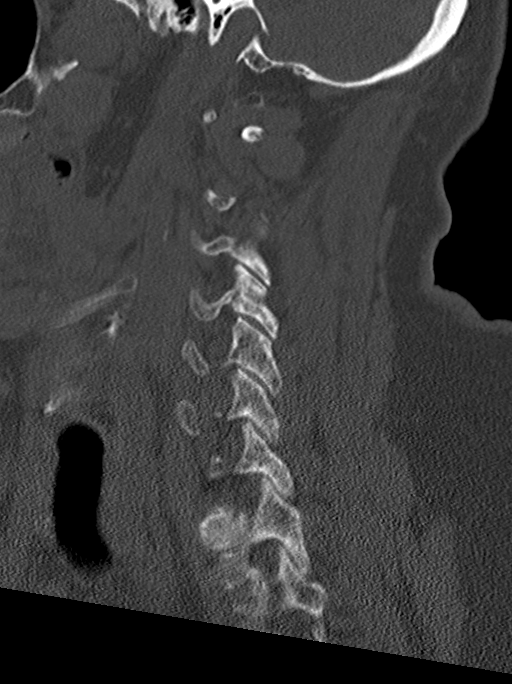
[im 31/61  bone]
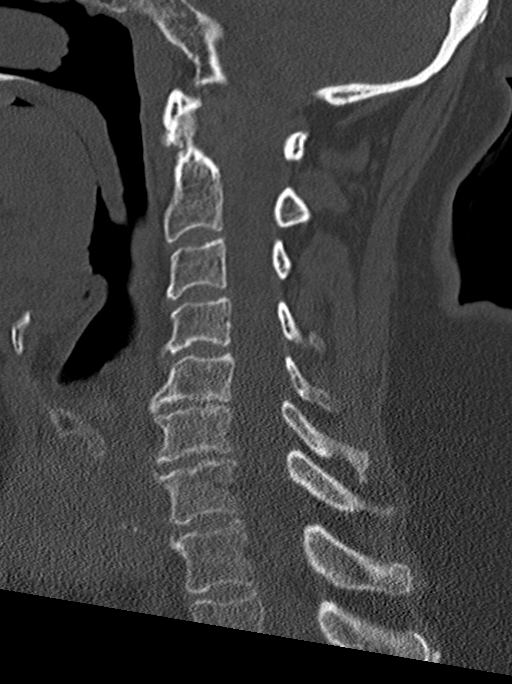
[im 41/61  bone]
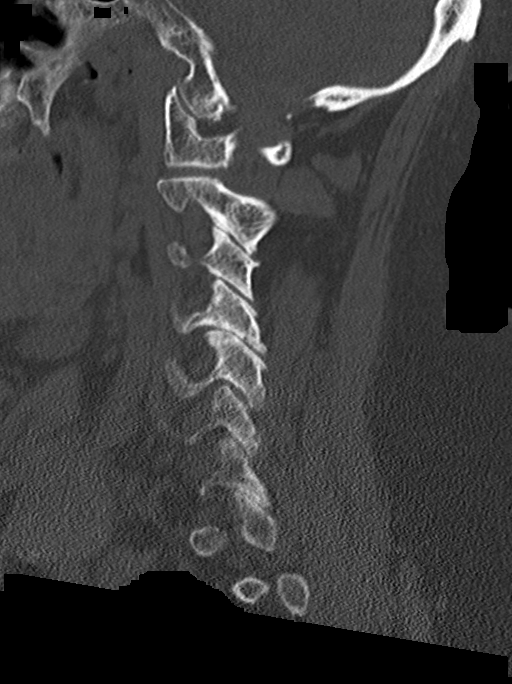
[im 51/61  bone]
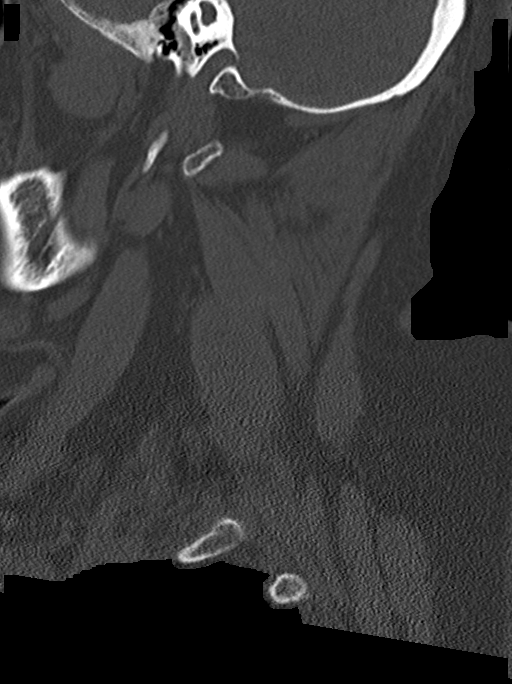

[Series 10: coronal bone · coronal · 0.27mm/px · 1 of 61 slices shown]
[im 31/61  bone]
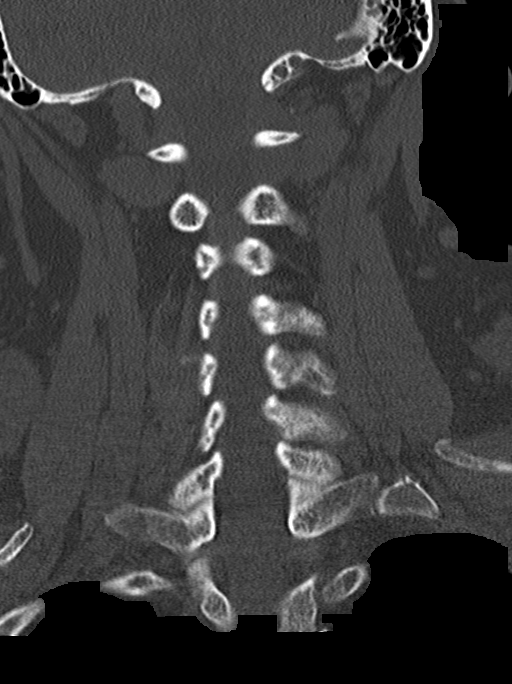

[Series 11: orthogonal axials · axial · 0.21mm/px · z∈[+889,+977]mm · 4 of 84 slices shown, 5 images]
[im 17/84  soft-tissue]
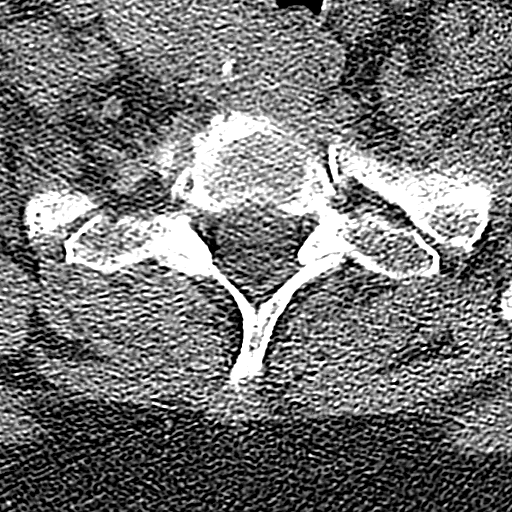
[im 17/84  bone]
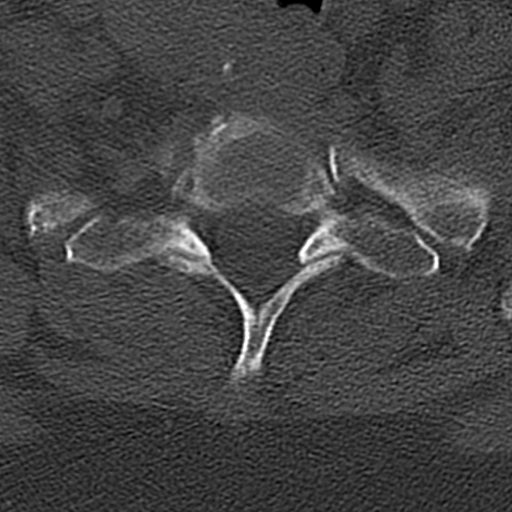
[im 34/84  bone]
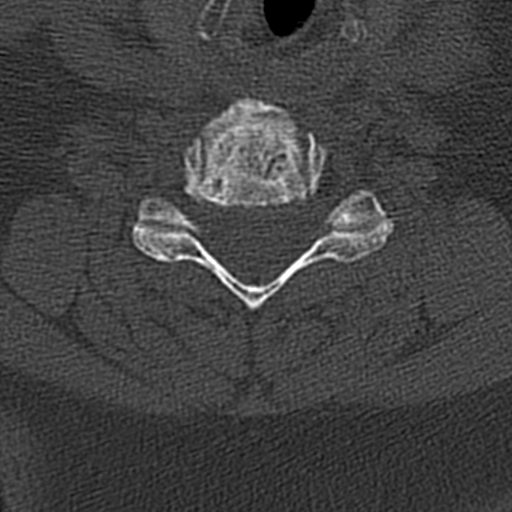
[im 50/84  bone]
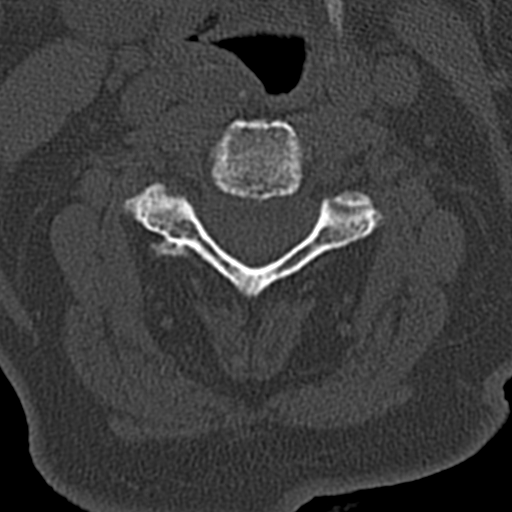
[im 67/84  bone]
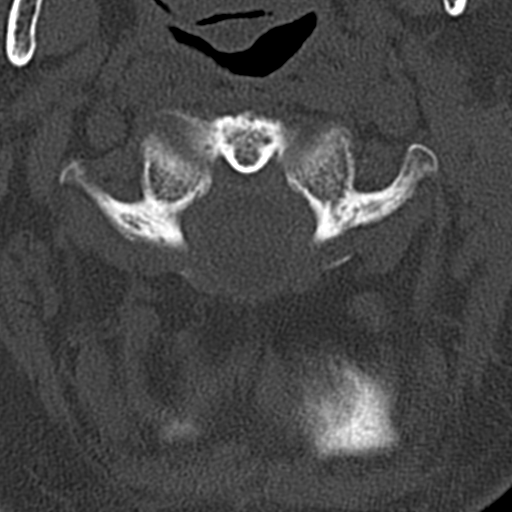

[14 of 33 positions shown; findings below may reference images not displayed]

FINDINGS: Brain: No evidence of acute territorial infarction, hemorrhage,
hydrocephalus,extra-axial collection or mass lesion/mass effect.
There is dilatation the ventricles and sulci consistent with
age-related atrophy. Low-attenuation changes in the deep white
matter consistent with small vessel ischemia.

Vascular: No hyperdense vessel or unexpected calcification.

Skull: The skull is intact. No fracture or focal lesion identified.

Sinuses/Orbits: The visualized paranasal sinuses and mastoid air
cells are clear. The orbits and globes intact.

Other: small soft tissue hematoma seen overlying the right and left
frontal skull.

Cervical spine:

Alignment: There is straightening of the normal cervical lordosis.

Skull base and vertebrae: Visualized skull base is intact. No
atlanto-occipital dissociation. The vertebral body heights are well
maintained. No fracture or pathologic osseous lesion seen.

Soft tissues and spinal canal: The visualized paraspinal soft
tissues are unremarkable. No prevertebral soft tissue swelling is
seen. The spinal canal is grossly unremarkable, no large epidural
collection or significant canal narrowing.

Disc levels: Disc height loss with disc osteophyte complex and
uncovertebral osteophytes are most notable at C5-C6 and C6-C7.

Upper chest: There is a large heterogeneous mass seen within the
right thyroid lobe measuring 4.1 cm with peripheral coarse
calcifications. The mass extends into the upper mediastinum.

Other: None
IMPRESSION: 1. No acute intracranial abnormality.
2. Findings consistent with age related atrophy and chronic small
vessel ischemia
3. Two small soft tissue hematoma seen overlying the right and left
frontal skull
4.  No acute fracture or malalignment of the spine.
5. Large 4.1 cm heterogeneous mass seen within the right thyroid
lobe. would recommend thyroid ultrasound for further evaluation.

## 2020-04-10 ENCOUNTER — Ambulatory Visit (INDEPENDENT_AMBULATORY_CARE_PROVIDER_SITE_OTHER): Payer: Medicare HMO

## 2020-04-10 DIAGNOSIS — Z0000 Encounter for general adult medical examination without abnormal findings: Secondary | ICD-10-CM

## 2020-04-10 NOTE — Progress Notes (Signed)
MEDICARE ANNUAL WELLNESS VISIT  04/10/2020  Telephone Visit Disclaimer This Medicare AWV was conducted by telephone due to national recommendations for restrictions regarding the COVID-19 Pandemic (e.g. social distancing).  I verified, using two identifiers, that I am speaking with Linda Fletcher or their authorized healthcare agent. I discussed the limitations, risks, security, and privacy concerns of performing an evaluation and management service by telephone and the potential availability of an in-person appointment in the future. The patient expressed understanding and agreed to proceed.  Location of Patient: Home Location of Provider (nurse):  [facility]  Subjective:    Linda Fletcher is a 80 y.o. female patient of Linda Fletcher, Linda Kaufmann, MD who had a Medicare Annual Wellness Visit today via telephone. 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 She has three children and three grandchildren. She reports that she is socially active and does interact with friends/family regularly. She is minimally physically active and enjoys watching television and doing word search puzzles.  Patient Care Team: Linda Fletcher, Linda Kaufmann, MD as PCP - General (Family Medicine) Melina Schools, OD (Optometry) Ilean China, RN as Registered Nurse  Advanced Directives 04/10/2020 09/08/2018 12/14/2017 10/07/2016  Does Patient Have a Medical Advance Directive? No No No No  Would patient like information on creating a medical advance directive? No - Patient declined - Yes (MAU/Ambulatory/Procedural Areas - Information given) Yes (ED - Information included in AVS)    Hospital Utilization Over the Past 12 Months: # of hospitalizations or ER visits: 0 # of surgeries: 0  Review of Systems    Patient reports that her overall health is unchanged compared to last year.  History obtained from chart review and the patient  Patient Reported Readings (BP, Pulse, CBG, Weight, etc) none  Pain  Assessment Pain : No/denies pain     Current Medications & Allergies (verified) Allergies as of 04/10/2020   No Known Allergies     Medication List       Accurate as of April 10, 2020  3:53 PM. If you have any questions, ask your nurse or doctor.        blood glucose meter kit and supplies Kit Dispense based on patient and insurance preference. Use up to four times daily as directed. (FOR ICD-9 250.00, 250.01).   calcium carbonate 600 MG Tabs tablet Commonly known as: OS-CAL Take 1 tablet (600 mg total) by mouth 2 (two) times daily with a meal.   citalopram 40 MG tablet Commonly known as: CELEXA Take 1 tablet (40 mg total) by mouth daily.   donepezil 10 MG tablet Commonly known as: ARICEPT Take 1 tablet (10 mg total) by mouth at bedtime.   ferrous sulfate 325 (65 FE) MG tablet Commonly known as: FeroSul Take 1 tablet (325 mg total) by mouth daily with breakfast.   furosemide 40 MG tablet Commonly known as: LASIX Take 1 tablet (40 mg total) by mouth daily.   glipiZIDE 10 MG tablet Commonly known as: GLUCOTROL Take 1 tablet (10 mg total) by mouth 2 (two) times daily.   losartan 50 MG tablet Commonly known as: COZAAR Take 1 tablet (50 mg total) by mouth daily.   lovastatin 20 MG tablet Commonly known as: MEVACOR Take 1 tablet (20 mg total) by mouth daily.   omeprazole 40 MG capsule Commonly known as: PRILOSEC Take 1 capsule (40 mg total) by mouth daily.   Potassium Chloride ER 20 MEQ Tbcr Take 20 mEq by mouth daily.  PROBIOTIC DAILY PO Take 1 capsule by mouth daily.   Rybelsus 7 MG Tabs Generic drug: Semaglutide Take 7 mg by mouth daily.   traZODone 100 MG tablet Commonly known as: DESYREL TAKE 1 TABLET (100 MG TOTAL) BY MOUTH AT BEDTIME AS NEEDED FOR SLEEP.   True Metrix Blood Glucose Test test strip Generic drug: glucose blood Check BS up to 4 times daily Dx E11.9       History (reviewed): Past Medical History:  Diagnosis Date  .  Arthritis   . Dementia (HCC)   . Depression   . Diabetes mellitus without complication (HCC)   . GERD (gastroesophageal reflux disease)   . Glaucoma   . Hx of adenomatous colonic polyps   . Hyperlipidemia   . Hypertension   . Obesity    Past Surgical History:  Procedure Laterality Date  . ABDOMINAL HYSTERECTOMY    . CHOLECYSTECTOMY    . THYROID CYST EXCISION    . TONSILLECTOMY AND ADENOIDECTOMY     age 64   Family History  Problem Relation Age of Onset  . Arthritis Mother   . Diabetes Mother   . Diabetes Son   . Hypertension Son   . Hyperlipidemia Son   . Hypertension Daughter    Social History   Socioeconomic History  . Marital status: Widowed    Spouse name: Not on file  . Number of children: 3  . Years of education: Not on file  . Highest education level: 3rd grade  Occupational History  . Occupation: Retired     Comment: Neurosurgeon  Tobacco Use  . Smoking status: Former Smoker    Quit date: 07/18/1974    Years since quitting: 45.7  . Smokeless tobacco: Never Used  Vaping Use  . Vaping Use: Never used  Substance and Sexual Activity  . Alcohol use: No  . Drug use: No  . Sexual activity: Not Currently  Other Topics Concern  . Not on file  Social History Narrative   Lives with son and daughter in law. Cannot read very well due to 3rd grade education. Son helps with reading when needed.   Social Determinants of Health   Financial Resource Strain: Not on file  Food Insecurity: Not on file  Transportation Needs: Not on file  Physical Activity: Not on file  Stress: Not on file  Social Connections: Not on file    Activities of Daily Living In your present state of health, do you have any difficulty performing the following activities: 04/10/2020  Hearing? N  Vision? N  Difficulty concentrating or making decisions? N  Walking or climbing stairs? Y  Dressing or bathing? N  Doing errands, shopping? Y  Preparing Food and eating ? Y  Using the Toilet? N   In the past six months, have you accidently leaked urine? N  Do you have problems with loss of bowel control? N  Managing your Medications? Y  Managing your Finances? Y  Housekeeping or managing your Housekeeping? Y  Some recent data might be hidden  Patient reports that sometimes when is walking or climbing stairs she does have some shortness of breath.  Her son or daughter in law accompany her to doctor's visits and when she is running errands.  They do all of the cooking but she helps some with cleaning and household chores.  They also manage all of her medications and finances.  Patient Education/ Literacy How often do you need to have someone help you when you read instructions,  pamphlets, or other written materials from your doctor or pharmacy?: 2 - Rarely What is the last grade level you completed in school?: 9th grade  Exercise Current Exercise Habits: The patient does not participate in regular exercise at present, Exercise limited by: respiratory conditions(s)  Diet Patient reports consuming 3 meals a day and 1 snack(s) a day Patient reports that her primary diet is: Regular Patient reports that she does have regular access to food.   Depression Screen PHQ 2/9 Scores 04/10/2020 01/22/2020 07/21/2019 01/17/2019 10/03/2018 08/15/2018 06/17/2018  PHQ - 2 Score 0 0 2 0 0 0 1  PHQ- 9 Score - - 11 - - - -     Fall Risk Fall Risk  04/10/2020 01/22/2020 07/21/2019 06/23/2019 01/17/2019  Falls in the past year? 1 0 0 1 1  Number falls in past yr: 1 - 0 1 1  Injury with Fall? 0 - 0 0 0  Comment - - - - -  Risk for fall due to : History of fall(s);Impaired balance/gait;Impaired mobility - - History of fall(s) -  Risk for fall due to: Comment - - - - -  Follow up Falls evaluation completed - Falls evaluation completed Falls evaluation completed -  Comment - - - - -     Objective:  Linda Fletcher seemed alert and oriented and she participated appropriately during our telephone visit.  Blood  Pressure Weight BMI  BP Readings from Last 3 Encounters:  01/22/20 (!) 147/78  07/21/19 130/71  06/23/19 124/69   Wt Readings from Last 3 Encounters:  01/22/20 186 lb (84.4 kg)  07/21/19 186 lb 9.6 oz (84.6 kg)  06/23/19 186 lb 6 oz (84.5 kg)   BMI Readings from Last 1 Encounters:  01/22/20 31.93 kg/m    *Unable to obtain current vital signs, weight, and BMI due to telephone visit type  Hearing/Vision  . Linda Fletcher did not seem to have difficulty with hearing/understanding during the telephone conversation . Reports that she has had a formal eye exam by an eye care professional within the past year . Reports that she has not had a formal hearing evaluation within the past year *Unable to fully assess hearing and vision during telephone visit type  Cognitive Function: 6CIT Screen 04/10/2020 12/14/2017  What Year? 0 points 0 points  What month? 0 points 0 points  What time? 0 points 0 points  Count back from 20 0 points 0 points  Months in reverse 4 points 2 points  Repeat phrase 6 points 6 points  Total Score 10 8   (Normal:0-7, Significant for Dysfunction: >8)  Normal Cognitive Function Screening: Yes   Immunization & Health Maintenance Record Immunization History  Administered Date(s) Administered  . Influenza, High Dose Seasonal PF 12/13/2015, 10/07/2016, 12/14/2017  . Influenza,inj,Quad PF,6+ Mos 01/09/2015  . PFIZER(Purple Top)SARS-COV-2 Vaccination 03/09/2019, 04/06/2019  . Pneumococcal Conjugate-13 09/24/2016  . Pneumococcal Polysaccharide-23 12/14/2017  . Tdap 09/08/2018    Health Maintenance  Topic Date Due  . COVID-19 Vaccine (3 - Booster for Pfizer series) 04/22/2020 (Originally 10/07/2019)  . INFLUENZA VACCINE  10/14/2020 (Originally 08/13/2019)  . HEMOGLOBIN A1C  07/21/2020  . FOOT EXAM  01/21/2021  . OPHTHALMOLOGY EXAM  03/21/2021  . TETANUS/TDAP  09/07/2028  . DEXA SCAN  Completed  . PNA vac Low Risk Adult  Completed  . HPV VACCINES  Aged Out        Assessment  This is a routine wellness examination for Linda Fletcher.  Health Maintenance: Due or Overdue  There are no preventive care reminders to display for this patient.  Linda Fletcher does not need a referral for Community Assistance: Care Management:   no Social Work:    no Prescription Assistance:  no Nutrition/Diabetes Education:  no   Plan:  Personalized Goals Goals Addressed            This Visit's Progress   . Patient Stated       04/10/2020 AWV Goal: Fall Prevention  . Over the next year, patient will decrease their risk for falls by: o Using assistive devices, such as a cane or walker, as needed o Identifying fall risks within their home and correcting them by: - Removing throw rugs - Adding handrails to stairs or ramps - Removing clutter and keeping a clear pathway throughout the home - Increasing light, especially at night - Adding shower handles/bars - Raising toilet seat o Identifying potential personal risk factors for falls: - Medication side effects - Incontinence/urgency - Vestibular dysfunction - Hearing loss - Musculoskeletal disorders - Neurological disorders - Orthostatic hypotension        Personalized Health Maintenance & Screening Recommendations  up to date  Lung Cancer Screening Recommended: no (Low Dose CT Chest recommended if Age 31-80 years, 30 pack-year currently smoking OR have quit w/in past 15 years) Hepatitis C Screening recommended: no HIV Screening recommended: no  Advanced Directives: Written information was not prepared per patient's request.  Referrals & Orders No orders of the defined types were placed in this encounter.   Follow-up Plan . Follow-up with Linda Fletcher, Linda Kaufmann, MD as planned    I have personally reviewed and noted the following in the patient's chart:   . Medical and social history . Use of alcohol, tobacco or illicit drugs  . Current medications and supplements . Functional ability and  status . Nutritional status . Physical activity . Advanced directives . List of other physicians . Hospitalizations, surgeries, and ER visits in previous 12 months . Vitals . Screenings to include cognitive, depression, and falls . Referrals and appointments  In addition, I have reviewed and discussed with Linda Fletcher certain preventive protocols, quality metrics, and best practice recommendations. A written personalized care plan for preventive services as well as general preventive health recommendations is available and can be mailed to the patient at her request.      Linda Fletcher  04/10/2020   Patient declines after visit summary.

## 2020-04-19 IMAGING — US US FNA BIOPSY THYROID 1ST LESION
1 series · 13 of 25 positions shown · non-contrast
Comparison: Ultrasound 10/11/2018

MEDICATIONS:
None

COMPLICATIONS:
None immediate.

INDICATION: Indeterminate thyroid nodule

EXAM:
ULTRASOUND GUIDED FINE NEEDLE ASPIRATION OF INDETERMINATE THYROID
NODULE
TECHNIQUE: Informed written consent was obtained from the patient after a
discussion of the risks, benefits and alternatives to treatment.
Questions regarding the procedure were encouraged and answered. A
timeout was performed prior to the initiation of the procedure.

[Series 1: us fna biopsy thyroid 1st lesion · 41 acquisitions, 13 frames shown]
[im 1/41]
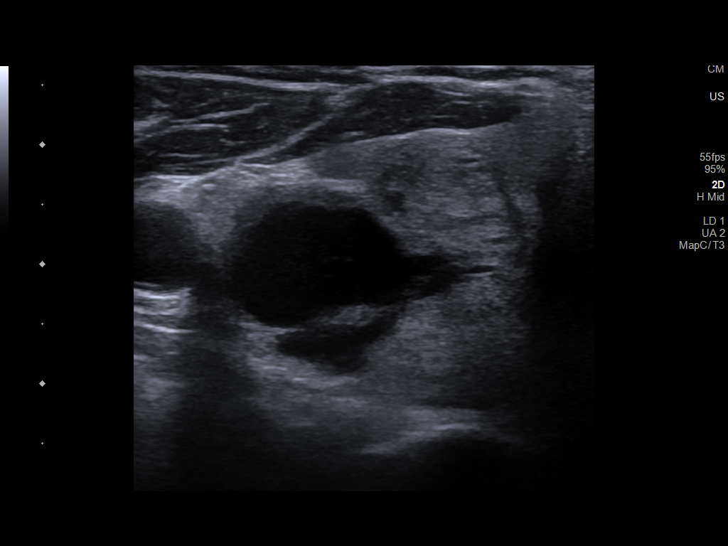
[im 4/41]
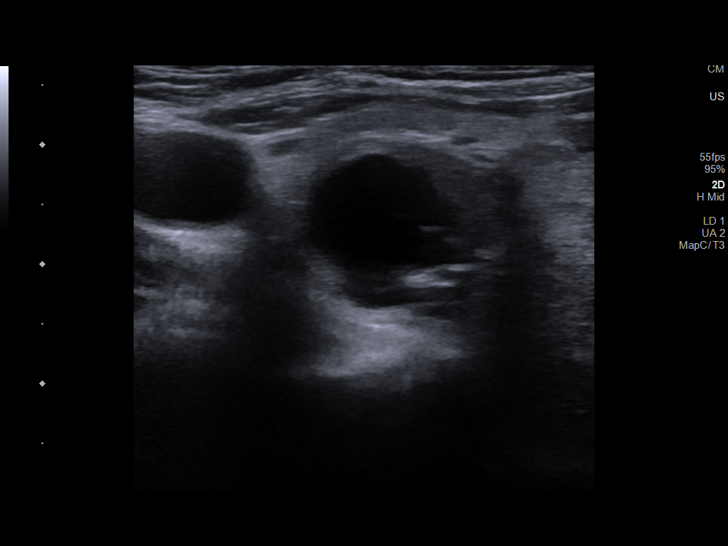
[im 7/41]
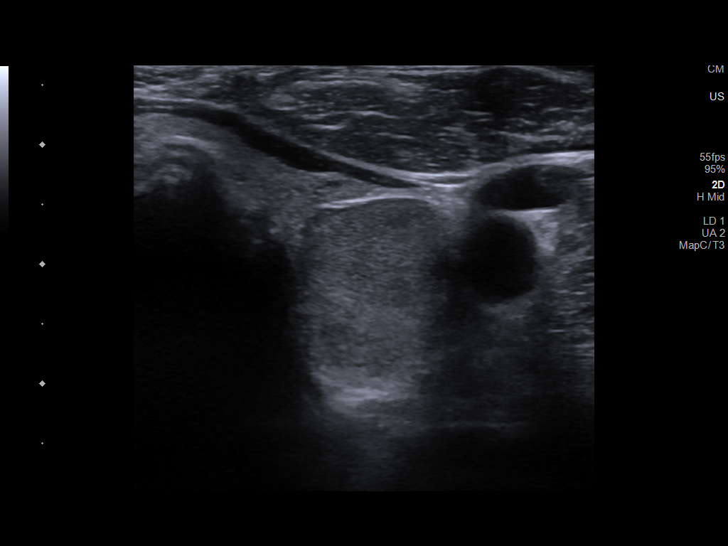
[im 11/41]
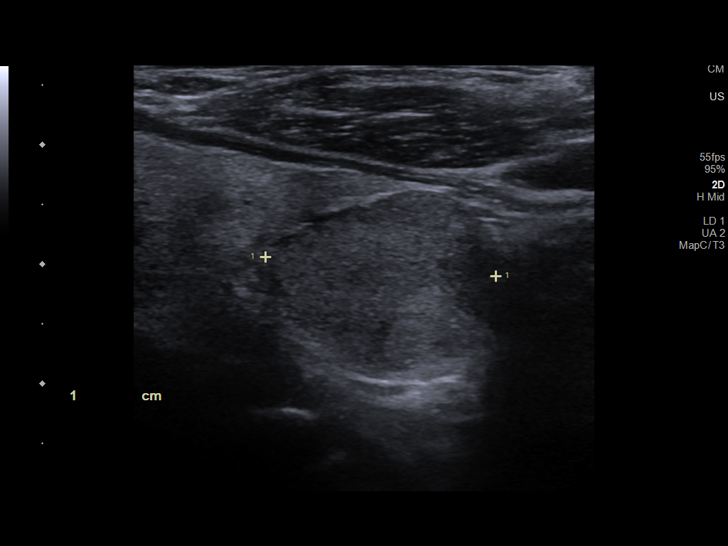
[im 14/41]
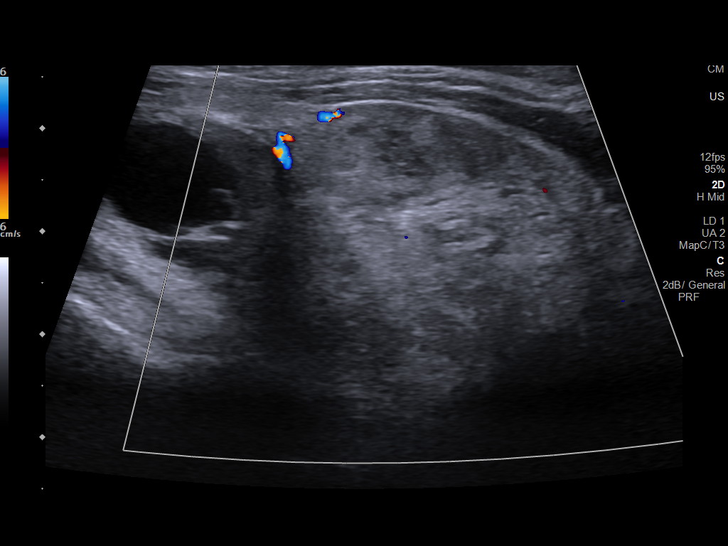
[im 17/41]
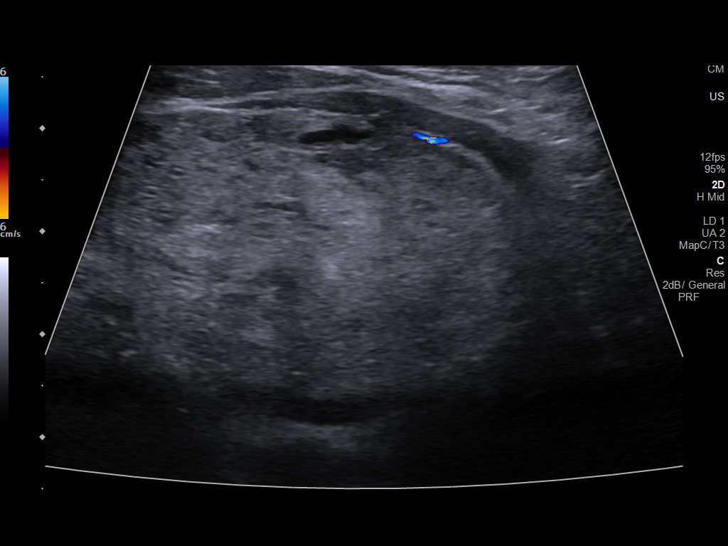
[im 21/41]
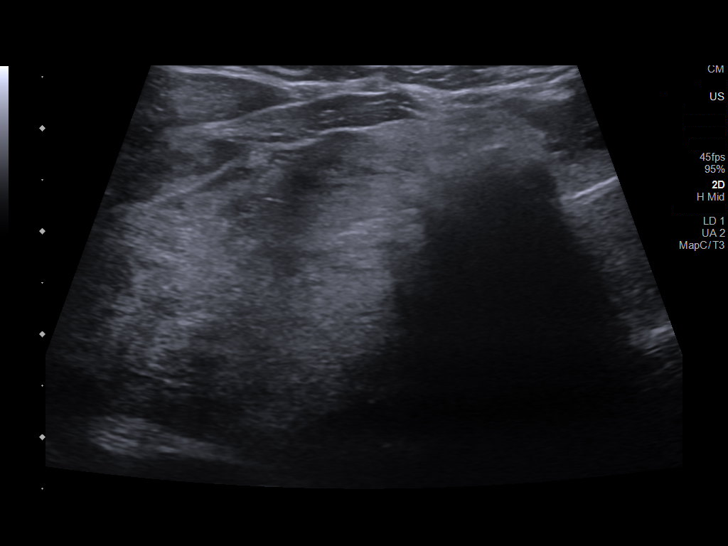
[im 24/41]
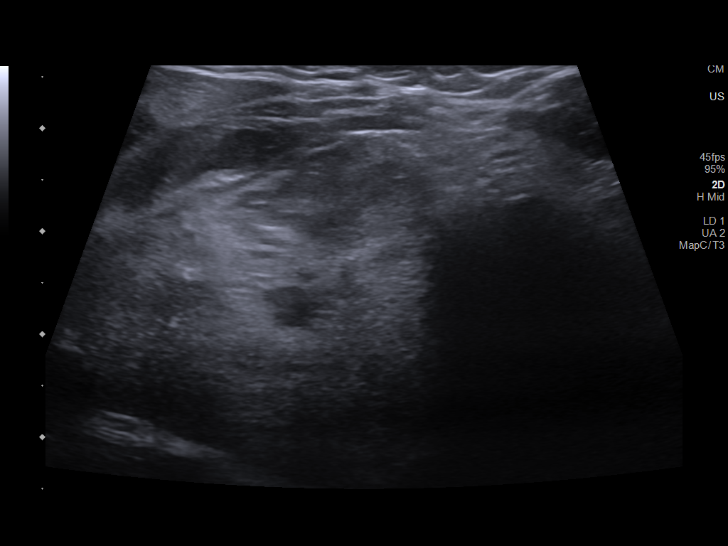
[im 27/41]
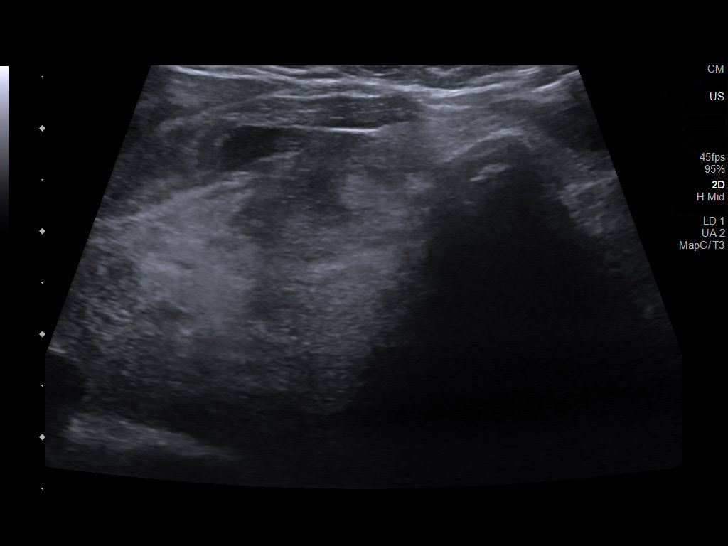
[im 31/41]
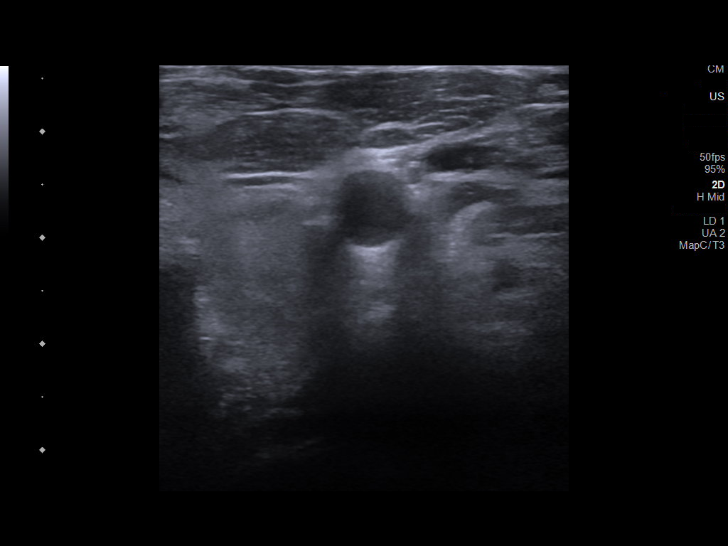
[im 34/41]
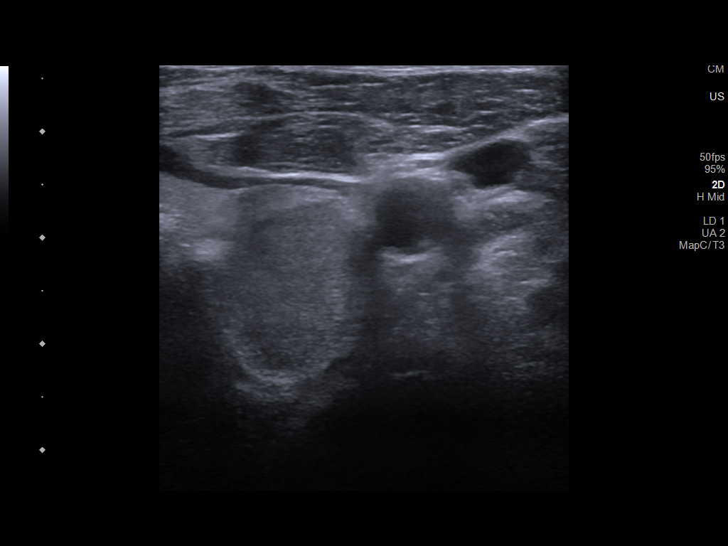
[im 37/41]
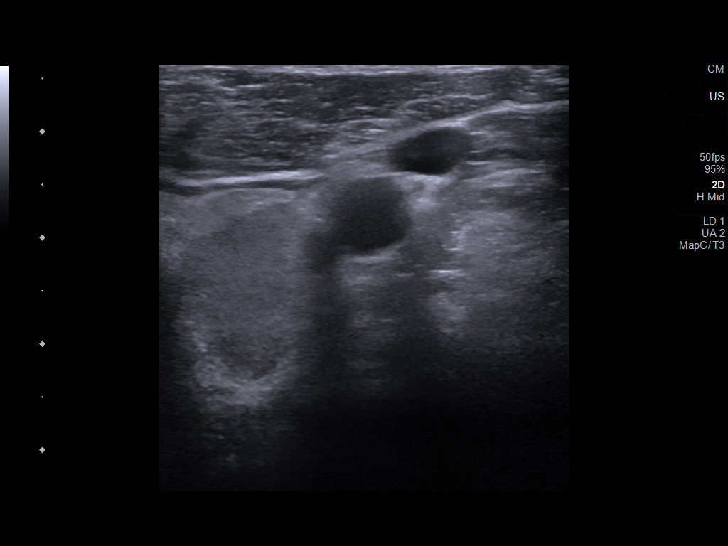
[im 41/41]
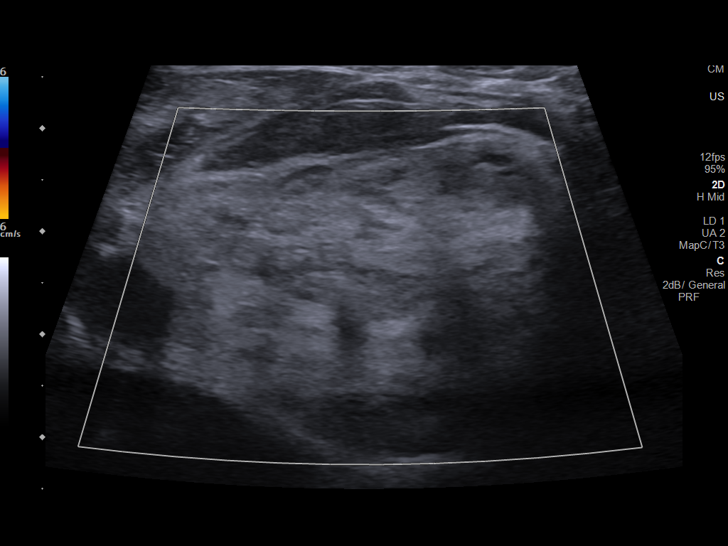

[13 of 25 positions shown; findings below may reference images not displayed]

Pre-procedural ultrasound scanning demonstrated unchanged size and
appearance of the indeterminate nodules within the right mid to
lower thyroid and left lower thyroid.

The procedure was planned. The neck was prepped in the usual sterile
fashion, and a sterile drape was applied covering the operative
field. A timeout was performed prior to the initiation of the
procedure. Local anesthesia was provided with 1% lidocaine.

Under direct ultrasound guidance, 5 FNA biopsies were performed of
the right thyroid nodule with a 25 gauge needle. Multiple ultrasound
images were saved for procedural documentation purposes. The samples
were prepared and submitted to pathology.

Under direct ultrasound guidance, 5 FNA biopsies were performed of
the left inferior pole nodule with a 25 gauge needle. Multiple
ultrasound images were saved for procedural documentation purposes.
The samples were prepared and submitted to pathology.

Limited post procedural scanning was negative for hematoma or
additional complication. Dressings were placed. The patient
tolerated the above procedures procedure well without immediate
postprocedural complication.
FINDINGS: Nodule reference number based on prior diagnostic ultrasound: 1

Maximum size: 4.9 cm

Location: Right; mid to lower pole

ACR TI-RADS risk category: TR4 (4-6 points)

Reason for biopsy: meets ACR TI-RADS criteria

_________________________________________________________

Nodule reference number based on prior diagnostic ultrasound: 2

Maximum size: 2.0 cm

Location: Left; lower pole

ACR TI-RADS risk category: TR5 (>/= 7 points)

Reason for biopsy: meets ACR TI-RADS criteria

Ultrasound imaging confirms appropriate placement of the needles
within the thyroid nodule.
IMPRESSION: 1. Technically successful ultrasound guided fine needle aspiration
of large right mid to lower pole thyroid nodule
2. Technically successful ultrasound guided fine needle aspiration
of left lower pole thyroid nodule

## 2020-04-19 IMAGING — US US BIOPSY FNA W/ IMAGING
1 series · 13 of 25 positions shown · non-contrast
Comparison: Ultrasound 10/11/2018

MEDICATIONS:
None

COMPLICATIONS:
None immediate.

INDICATION: Indeterminate thyroid nodule

EXAM:
ULTRASOUND GUIDED FINE NEEDLE ASPIRATION OF INDETERMINATE THYROID
NODULE
TECHNIQUE: Informed written consent was obtained from the patient after a
discussion of the risks, benefits and alternatives to treatment.
Questions regarding the procedure were encouraged and answered. A
timeout was performed prior to the initiation of the procedure.

[Series 1: us biopsy fna w/ imaging · 41 acquisitions, 13 frames shown]
[im 1/41]
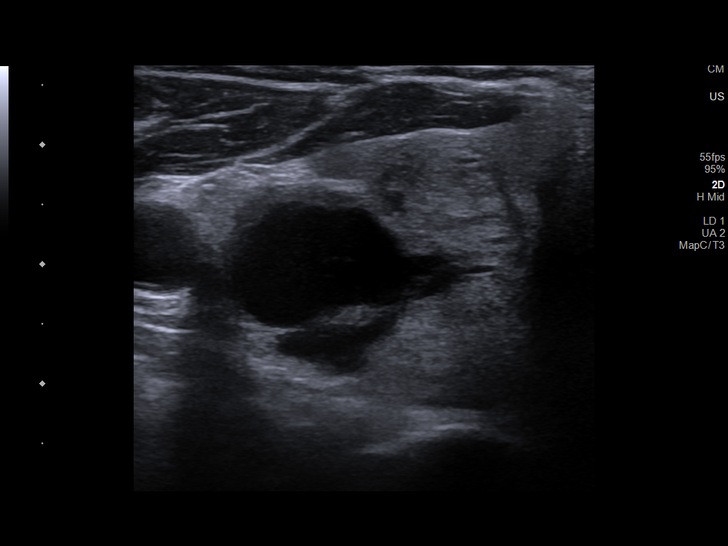
[im 4/41]
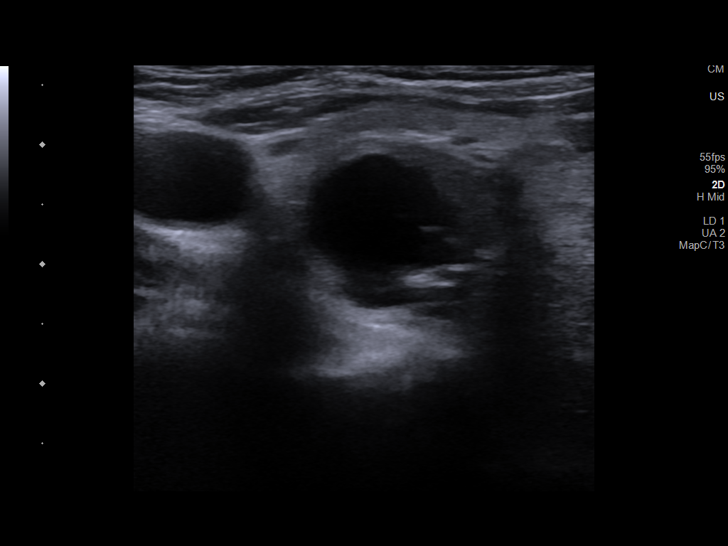
[im 7/41]
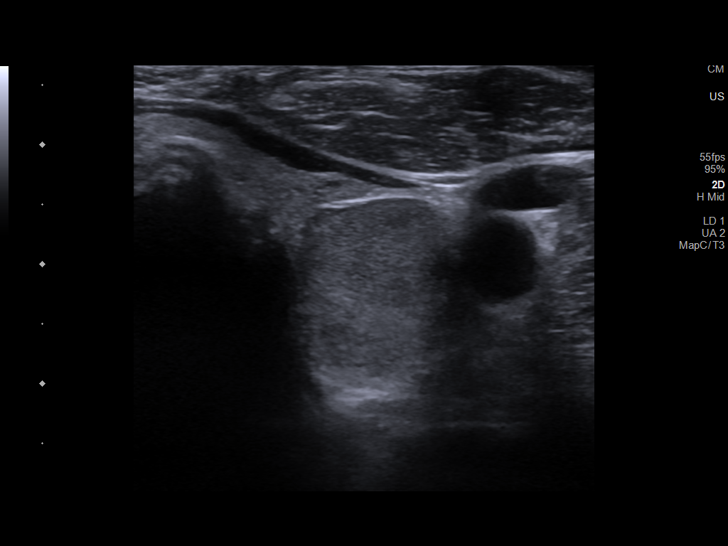
[im 11/41]
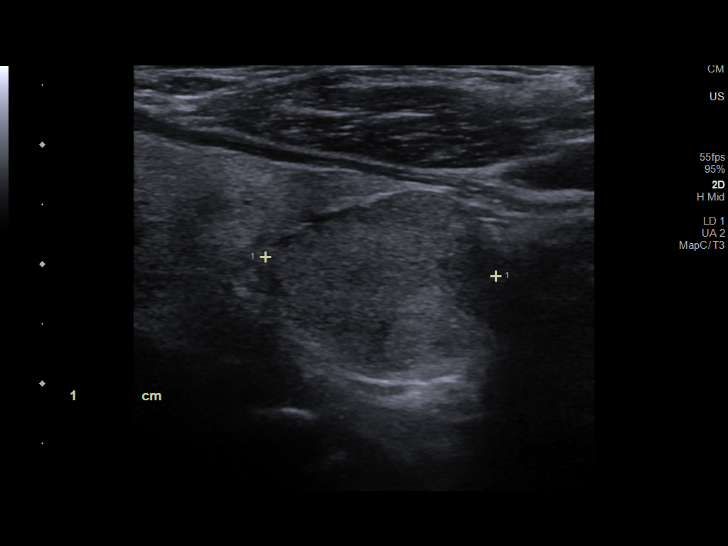
[im 14/41]
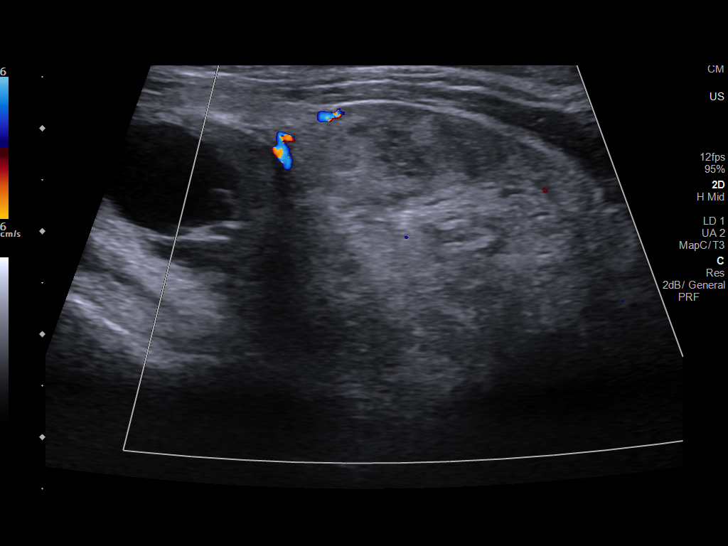
[im 17/41]
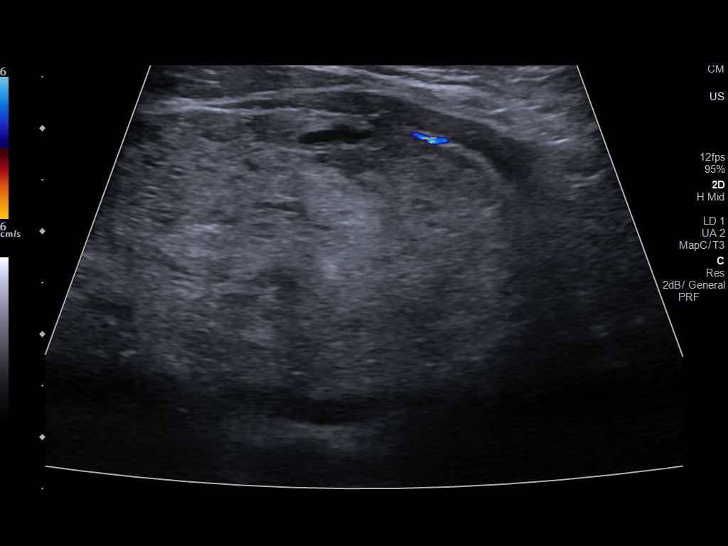
[im 21/41]
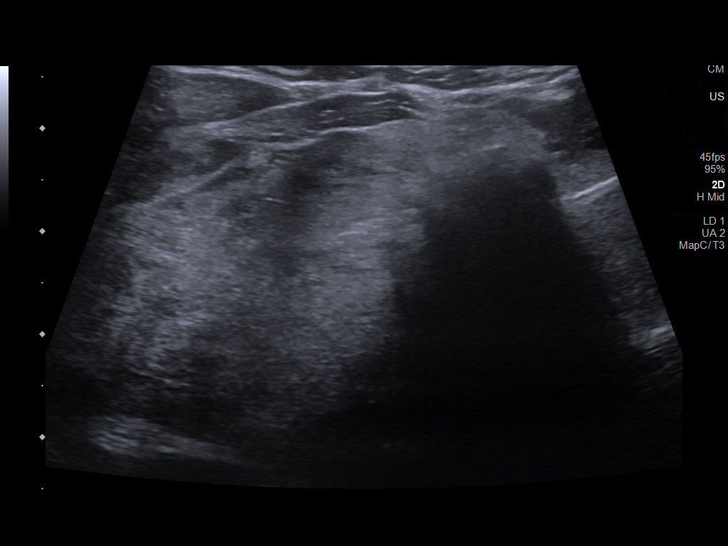
[im 24/41]
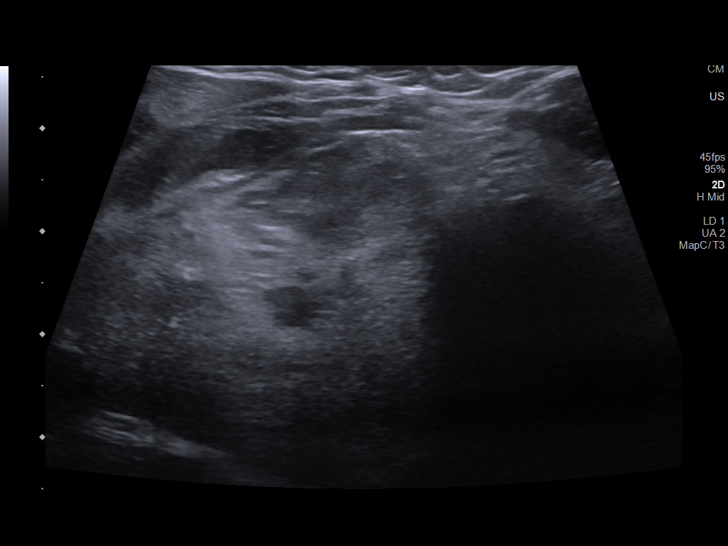
[im 27/41]
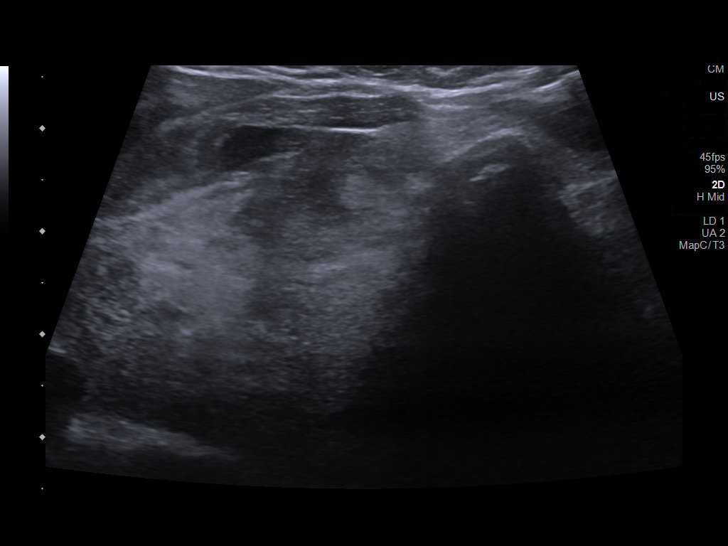
[im 31/41]
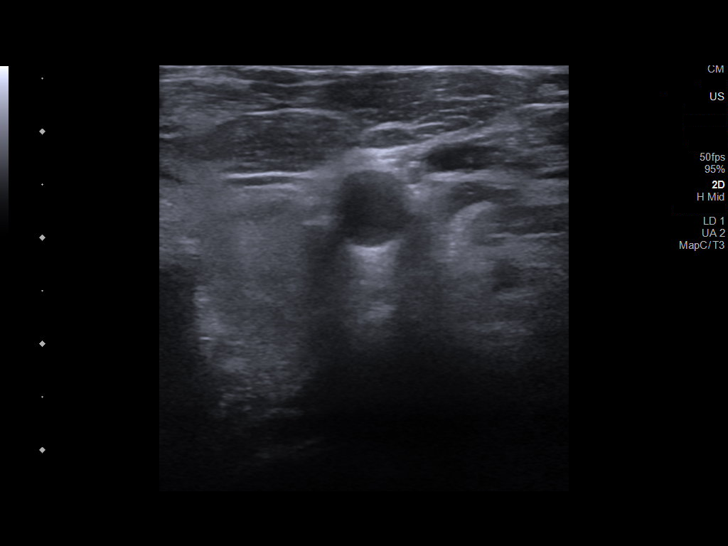
[im 34/41]
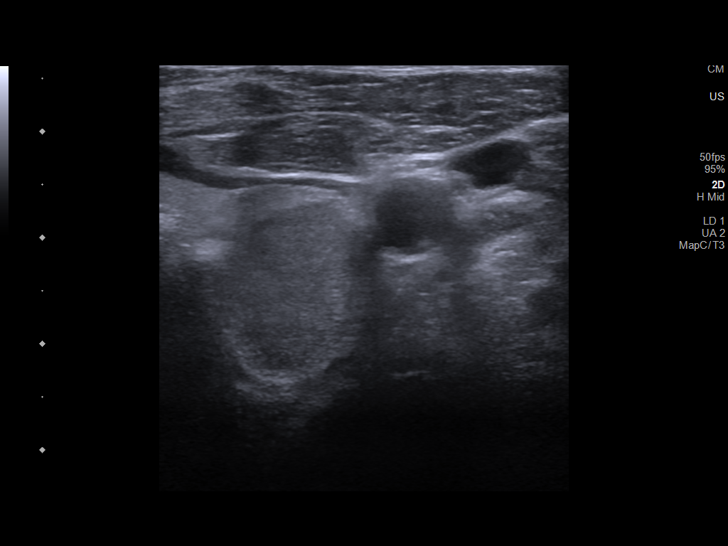
[im 37/41]
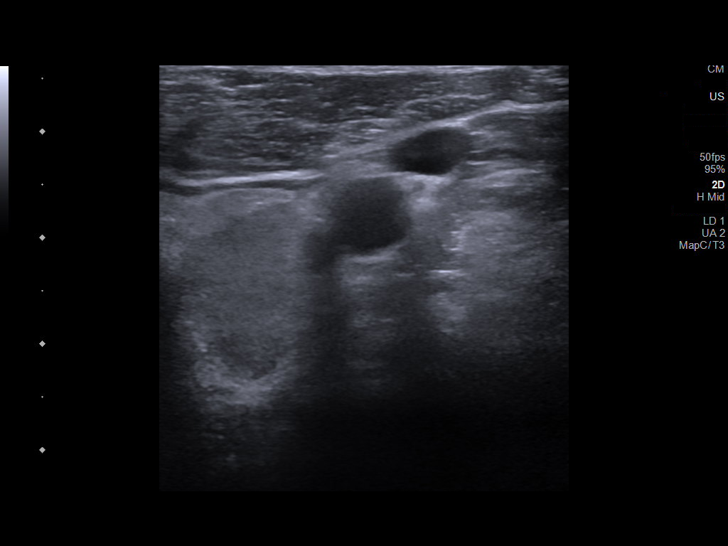
[im 41/41]
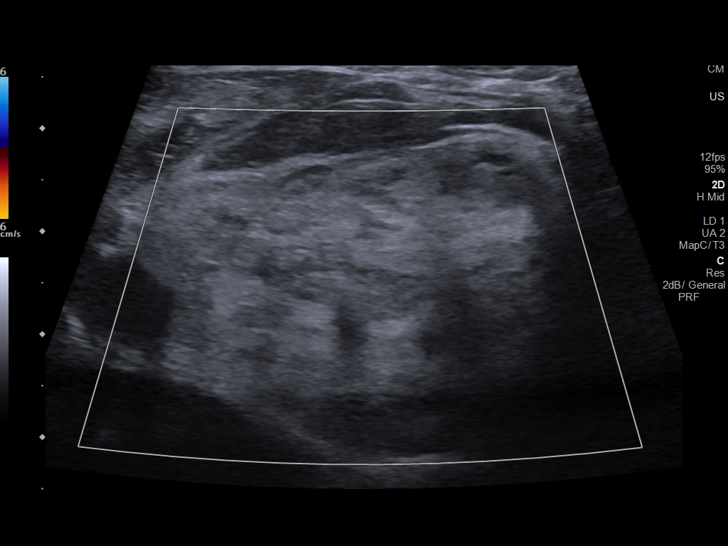

[13 of 25 positions shown; findings below may reference images not displayed]

Pre-procedural ultrasound scanning demonstrated unchanged size and
appearance of the indeterminate nodules within the right mid to
lower thyroid and left lower thyroid.

The procedure was planned. The neck was prepped in the usual sterile
fashion, and a sterile drape was applied covering the operative
field. A timeout was performed prior to the initiation of the
procedure. Local anesthesia was provided with 1% lidocaine.

Under direct ultrasound guidance, 5 FNA biopsies were performed of
the right thyroid nodule with a 25 gauge needle. Multiple ultrasound
images were saved for procedural documentation purposes. The samples
were prepared and submitted to pathology.

Under direct ultrasound guidance, 5 FNA biopsies were performed of
the left inferior pole nodule with a 25 gauge needle. Multiple
ultrasound images were saved for procedural documentation purposes.
The samples were prepared and submitted to pathology.

Limited post procedural scanning was negative for hematoma or
additional complication. Dressings were placed. The patient
tolerated the above procedures procedure well without immediate
postprocedural complication.
FINDINGS: Nodule reference number based on prior diagnostic ultrasound: 1

Maximum size: 4.9 cm

Location: Right; mid to lower pole

ACR TI-RADS risk category: TR4 (4-6 points)

Reason for biopsy: meets ACR TI-RADS criteria

_________________________________________________________

Nodule reference number based on prior diagnostic ultrasound: 2

Maximum size: 2.0 cm

Location: Left; lower pole

ACR TI-RADS risk category: TR5 (>/= 7 points)

Reason for biopsy: meets ACR TI-RADS criteria

Ultrasound imaging confirms appropriate placement of the needles
within the thyroid nodule.
IMPRESSION: 1. Technically successful ultrasound guided fine needle aspiration
of large right mid to lower pole thyroid nodule
2. Technically successful ultrasound guided fine needle aspiration
of left lower pole thyroid nodule

## 2020-04-22 ENCOUNTER — Other Ambulatory Visit: Payer: Self-pay

## 2020-04-22 ENCOUNTER — Encounter: Payer: Self-pay | Admitting: Family Medicine

## 2020-04-22 ENCOUNTER — Ambulatory Visit (INDEPENDENT_AMBULATORY_CARE_PROVIDER_SITE_OTHER): Payer: Medicare HMO | Admitting: Family Medicine

## 2020-04-22 ENCOUNTER — Ambulatory Visit (INDEPENDENT_AMBULATORY_CARE_PROVIDER_SITE_OTHER): Payer: Medicare HMO

## 2020-04-22 VITALS — BP 143/83 | HR 84 | Ht 64.0 in | Wt 190.0 lb

## 2020-04-22 DIAGNOSIS — E1159 Type 2 diabetes mellitus with other circulatory complications: Secondary | ICD-10-CM | POA: Diagnosis not present

## 2020-04-22 DIAGNOSIS — K219 Gastro-esophageal reflux disease without esophagitis: Secondary | ICD-10-CM | POA: Diagnosis not present

## 2020-04-22 DIAGNOSIS — Z780 Asymptomatic menopausal state: Secondary | ICD-10-CM | POA: Diagnosis not present

## 2020-04-22 DIAGNOSIS — E1169 Type 2 diabetes mellitus with other specified complication: Secondary | ICD-10-CM | POA: Diagnosis not present

## 2020-04-22 DIAGNOSIS — G301 Alzheimer's disease with late onset: Secondary | ICD-10-CM

## 2020-04-22 DIAGNOSIS — F0280 Dementia in other diseases classified elsewhere without behavioral disturbance: Secondary | ICD-10-CM

## 2020-04-22 DIAGNOSIS — I152 Hypertension secondary to endocrine disorders: Secondary | ICD-10-CM

## 2020-04-22 DIAGNOSIS — E119 Type 2 diabetes mellitus without complications: Secondary | ICD-10-CM | POA: Diagnosis not present

## 2020-04-22 DIAGNOSIS — M85832 Other specified disorders of bone density and structure, left forearm: Secondary | ICD-10-CM | POA: Diagnosis not present

## 2020-04-22 DIAGNOSIS — E785 Hyperlipidemia, unspecified: Secondary | ICD-10-CM

## 2020-04-22 DIAGNOSIS — F339 Major depressive disorder, recurrent, unspecified: Secondary | ICD-10-CM

## 2020-04-22 LAB — BAYER DCA HB A1C WAIVED: HB A1C (BAYER DCA - WAIVED): 8.2 % — ABNORMAL HIGH (ref <7.0)

## 2020-04-22 NOTE — Progress Notes (Signed)
BP (!) 143/83   Pulse 84   Ht $R'5\' 4"'ry$  (1.626 m)   Wt 190 lb (86.2 kg)   SpO2 95%   BMI 32.61 kg/m    Subjective:   Patient ID: Linda Fletcher, female    DOB: 06/21/1940, 80 y.o.   MRN: [id number]  HPI: Linda Fletcher is a 80 y.o. female presenting on 04/22/2020 for Medical Management of Chronic Issues, Hypertension, Diabetes, and Hyperlipidemia   HPI Type 2 diabetes mellitus Patient comes in today for recheck of his diabetes. Patient has been currently taking glipizide, stopped the Metformin with diarrhea is doing better.  She never did get the Rybelsus. Patient is currently on an ACE inhibitor/ARB. Patient has not seen an ophthalmologist this year. Patient denies any issues with their feet. The symptom started onset as an adult hyperlipidemia and hypertension ARE RELATED TO DM   Hyperlipidemia Patient is coming in for recheck of his hyperlipidemia. The patient is currently taking lovastatin. They deny any issues with myalgias or history of liver damage from it. They deny any focal numbness or weakness or chest pain.   Hypertension Patient is currently on losartan and furosemide, and their blood pressure today is 143/83. Patient denies any lightheadedness or dizziness. Patient denies headaches, blurred vision, chest pains, shortness of breath, or weakness. Denies any side effects from medication and is content with current medication.   Depression and dementia Celexa and Aricept, for them memory is off and on and mood goes with memory as well but they are satisfied with where he is at right now.  Trazodone is helping him sleep time to the knee.  Relevant past medical, surgical, family and social history reviewed and updated as indicated. Interim medical history since our last visit reviewed. Allergies and medications reviewed and updated.  Review of Systems  Constitutional: Negative for chills and fever.  HENT: Negative for congestion, ear discharge and ear pain.   Eyes: Negative for  redness and visual disturbance.  Respiratory: Negative for chest tightness and shortness of breath.   Cardiovascular: Positive for leg swelling. Negative for chest pain.  Genitourinary: Negative for difficulty urinating and dysuria.  Musculoskeletal: Negative for back pain and gait problem.  Skin: Negative for rash.  Neurological: Negative for light-headedness and headaches.  Psychiatric/Behavioral: Negative for agitation and behavioral problems.  All other systems reviewed and are negative.   Per HPI unless specifically indicated above   Allergies as of 04/22/2020   No Known Allergies     Medication List       Accurate as of April 22, 2020  8:19 AM. If you have any questions, ask your nurse or doctor.        STOP taking these medications   Rybelsus 7 MG Tabs Generic drug: Semaglutide Stopped by: Fransisca Kaufmann Jaylyn Booher, MD     TAKE these medications   blood glucose meter kit and supplies Kit Dispense based on patient and insurance preference. Use up to four times daily as directed. (FOR ICD-9 250.00, 250.01).   calcium carbonate 600 MG Tabs tablet Commonly known as: OS-CAL Take 1 tablet (600 mg total) by mouth 2 (two) times daily with a meal.   citalopram 40 MG tablet Commonly known as: CELEXA Take 1 tablet (40 mg total) by mouth daily.   donepezil 10 MG tablet Commonly known as: ARICEPT Take 1 tablet (10 mg total) by mouth at bedtime.   ferrous sulfate 325 (65 FE) MG tablet Commonly known as: FeroSul Take 1 tablet (325 mg total)  by mouth daily with breakfast.   furosemide 40 MG tablet Commonly known as: LASIX Take 1 tablet (40 mg total) by mouth daily.   glipiZIDE 10 MG tablet Commonly known as: GLUCOTROL Take 1 tablet (10 mg total) by mouth 2 (two) times daily.   losartan 50 MG tablet Commonly known as: COZAAR Take 1 tablet (50 mg total) by mouth daily.   lovastatin 20 MG tablet Commonly known as: MEVACOR Take 1 tablet (20 mg total) by mouth daily.    omeprazole 40 MG capsule Commonly known as: PRILOSEC Take 1 capsule (40 mg total) by mouth daily.   Potassium Chloride ER 20 MEQ Tbcr Take 20 mEq by mouth daily.   PROBIOTIC DAILY PO Take 1 capsule by mouth daily.   traZODone 100 MG tablet Commonly known as: DESYREL TAKE 1 TABLET (100 MG TOTAL) BY MOUTH AT BEDTIME AS NEEDED FOR SLEEP.   True Metrix Blood Glucose Test test strip Generic drug: glucose blood Check BS up to 4 times daily Dx E11.9        Objective:   BP (!) 143/83   Pulse 84   Ht $R'5\' 4"'Li$  (1.626 m)   Wt 190 lb (86.2 kg)   SpO2 95%   BMI 32.61 kg/m   Wt Readings from Last 3 Encounters:  04/22/20 190 lb (86.2 kg)  01/22/20 186 lb (84.4 kg)  07/21/19 186 lb 9.6 oz (84.6 kg)    Physical Exam Vitals and nursing note reviewed.  Constitutional:      General: She is not in acute distress.    Appearance: She is well-developed. She is not diaphoretic.  Eyes:     Conjunctiva/sclera: Conjunctivae normal.  Cardiovascular:     Rate and Rhythm: Normal rate and regular rhythm.     Heart sounds: Normal heart sounds. No murmur heard.   Pulmonary:     Effort: Pulmonary effort is normal. No respiratory distress.     Breath sounds: Normal breath sounds. No wheezing.  Musculoskeletal:        General: No tenderness. Normal range of motion.  Skin:    General: Skin is warm and dry.     Findings: No rash.  Neurological:     Mental Status: She is alert and oriented to person, place, and time.     Coordination: Coordination normal.  Psychiatric:        Behavior: Behavior normal.     Results for orders placed or performed in visit on 03/21/20  HM DIABETES EYE EXAM  Result Value Ref Range   HM Diabetic Eye Exam Retinopathy (A) No Retinopathy    Assessment & Plan:   Problem List Items Addressed This Visit      Cardiovascular and Mediastinum   Hypertension associated with diabetes ([place])     Digestive   GERD (gastroesophageal reflux disease)     Endocrine    Diabetes mellitus without complication ([place]) - Primary   Relevant Orders   Bayer DCA Hb A1c Waived   Hyperlipidemia associated with type 2 diabetes mellitus ([place])     Nervous and Auditory   Dementia (HCC)     Other   Depression, recurrent ([place])    Other Visit Diagnoses    Postmenopausal       Relevant Orders   DG [facility] DEXA      Continue current medication, on the blood work is running up and down some, mainly ones of postprandial, not too bad in the morning.  We will see where her A1c is  today when we check that. Follow up plan: Return in about 3 months (around 07/22/2020), or if symptoms worsen or fail to improve, for Diabetes recheck.  Counseling provided for all of the vaccine components Orders Placed This Encounter  Procedures  . DG [facility] DEXA  . Bayer [facility] Hb A1c [place], MD [facility] 04/22/2020, 8:19 AM

## 2020-04-25 ENCOUNTER — Encounter: Payer: Self-pay | Admitting: Family Medicine

## 2020-04-25 MED ORDER — SITAGLIPTIN PHOSPHATE 100 MG PO TABS: 100.0000 mg | ORAL_TABLET | Freq: Every day | ORAL | 3 refills | Status: DC

## 2020-05-28 DIAGNOSIS — E7800 Pure hypercholesterolemia, unspecified: Secondary | ICD-10-CM | POA: Diagnosis not present

## 2020-05-28 DIAGNOSIS — E119 Type 2 diabetes mellitus without complications: Secondary | ICD-10-CM | POA: Diagnosis not present

## 2020-05-28 DIAGNOSIS — H5200 Hypermetropia, unspecified eye: Secondary | ICD-10-CM | POA: Diagnosis not present

## 2020-06-22 ENCOUNTER — Encounter: Payer: Self-pay | Admitting: Family Medicine

## 2020-06-24 NOTE — Telephone Encounter (Signed)
I called and scheduled patient an appointment

## 2020-06-26 ENCOUNTER — Other Ambulatory Visit: Payer: Self-pay

## 2020-06-26 ENCOUNTER — Ambulatory Visit (INDEPENDENT_AMBULATORY_CARE_PROVIDER_SITE_OTHER): Payer: Medicare HMO | Admitting: Family Medicine

## 2020-06-26 ENCOUNTER — Encounter: Payer: Self-pay | Admitting: Family Medicine

## 2020-06-26 VITALS — BP 147/84 | HR 78 | Ht 64.0 in | Wt 190.0 lb

## 2020-06-26 DIAGNOSIS — Z8589 Personal history of malignant neoplasm of other organs and systems: Secondary | ICD-10-CM

## 2020-06-26 DIAGNOSIS — R591 Generalized enlarged lymph nodes: Secondary | ICD-10-CM

## 2020-06-26 NOTE — Progress Notes (Signed)
BP (!) 147/84   Pulse 78   Ht $R'5\' 4"'eO$  (1.626 m)   Wt 190 lb (86.2 kg)   SpO2 95%   BMI 32.61 kg/m    Subjective:   Patient ID: Linda Fletcher, female    DOB: Feb 01, 1940, 80 y.o.   MRN: [id number]  HPI: Linda Fletcher is a 80 y.o. female presenting on 06/26/2020 for nodule (Post left ear. Sore. Unsure how long present. Hx of mass removal near site. Records requested)   HPI Patient has a history of head and neck cancer and had radiation to her head and neck and has a scar where she had a previous lesion removed and over the past couple weeks has noticed a few small masses arising behind her ear right near the uppermost part of that scar.  It is slightly tender to palpation and is different than the other side behind her ear.  She had on her previous excision by an ear nose throat doctor in [place] and we are requesting the records.  Relevant past medical, surgical, family and social history reviewed and updated as indicated. Interim medical history since our last visit reviewed. Allergies and medications reviewed and updated.  Review of Systems  Constitutional:  Negative for chills and fever.  HENT:  Negative for congestion, ear discharge, ear pain and tinnitus.   Eyes:  Negative for visual disturbance.  Cardiovascular:  Negative for leg swelling.  Skin:  Negative for color change, rash and wound.  Neurological:  Negative for dizziness, weakness, light-headedness and headaches.   Per HPI unless specifically indicated above   Allergies as of 06/26/2020   No Known Allergies      Medication List        Accurate as of June 26, 2020  9:22 AM. If you have any questions, ask your nurse or doctor.          blood glucose meter kit and supplies Kit Dispense based on patient and insurance preference. Use up to four times daily as directed. (FOR ICD-9 250.00, 250.01).   calcium carbonate 600 MG Tabs tablet Commonly known as: OS-CAL Take 1 tablet (600 mg total) by mouth 2 (two)  times daily with a meal.   citalopram 40 MG tablet Commonly known as: CELEXA Take 1 tablet (40 mg total) by mouth daily.   donepezil 10 MG tablet Commonly known as: ARICEPT Take 1 tablet (10 mg total) by mouth at bedtime.   ferrous sulfate 325 (65 FE) MG tablet Commonly known as: FeroSul Take 1 tablet (325 mg total) by mouth daily with breakfast.   furosemide 40 MG tablet Commonly known as: LASIX Take 1 tablet (40 mg total) by mouth daily.   glipiZIDE 10 MG tablet Commonly known as: GLUCOTROL Take 1 tablet (10 mg total) by mouth 2 (two) times daily.   losartan 50 MG tablet Commonly known as: COZAAR Take 1 tablet (50 mg total) by mouth daily.   lovastatin 20 MG tablet Commonly known as: MEVACOR Take 1 tablet (20 mg total) by mouth daily.   omeprazole 40 MG capsule Commonly known as: PRILOSEC Take 1 capsule (40 mg total) by mouth daily.   Potassium Chloride ER 20 MEQ Tbcr Take 20 mEq by mouth daily.   PROBIOTIC DAILY PO Take 1 capsule by mouth daily.   sitaGLIPtin 100 MG tablet Commonly known as: Januvia Take 1 tablet (100 mg total) by mouth daily.   traZODone 100 MG tablet Commonly known as: DESYREL TAKE 1 TABLET (100 MG TOTAL)  BY MOUTH AT BEDTIME AS NEEDED FOR SLEEP.   True Metrix Blood Glucose Test test strip Generic drug: glucose blood Check BS up to 4 times daily Dx E11.9         Objective:   BP (!) 147/84   Pulse 78   Ht $R'5\' 4"'Si$  (1.626 m)   Wt 190 lb (86.2 kg)   SpO2 95%   BMI 32.61 kg/m   Wt Readings from Last 3 Encounters:  06/26/20 190 lb (86.2 kg)  04/22/20 190 lb (86.2 kg)  01/22/20 186 lb (84.4 kg)    Physical Exam Vitals and nursing note reviewed.  Constitutional:      Appearance: Normal appearance.  Lymphadenopathy:     Head:     Left side of head: Posterior auricular adenopathy present.     Cervical: Cervical adenopathy present.     Left cervical: Posterior cervical adenopathy present.     Comments: Small palpable and slightly  tender lymphadenopathy, near the uppermost part of her incision from previous cancer and one smaller 1 about mid incision posterior cervical.  Neurological:     Mental Status: She is alert.      Assessment & Plan:   Problem List Items Addressed This Visit   None Visit Diagnoses     Lymphadenopathy    -  Primary   Relevant Orders   CT SOFT TISSUE NECK W WO CONTRAST   History of head and neck cancer       Relevant Orders   CT SOFT TISSUE NECK W WO CONTRAST     Patient has neck mass that is tender and newly has arisen very near the scar where she had previous radiation 10 years ago for a mass that was removed in that same area  Follow up plan: Return if symptoms worsen or fail to improve.  Counseling provided for all of the vaccine components No orders of the defined types were placed in this encounter.   Caryl Pina, MD [facility] 06/26/2020, 9:22 AM

## 2020-07-01 ENCOUNTER — Telehealth: Payer: Self-pay | Admitting: Family Medicine

## 2020-07-05 ENCOUNTER — Ambulatory Visit (HOSPITAL_COMMUNITY): Payer: Medicare HMO

## 2020-07-05 ENCOUNTER — Other Ambulatory Visit: Payer: Self-pay | Admitting: Family Medicine

## 2020-07-18 ENCOUNTER — Emergency Department (HOSPITAL_COMMUNITY)
Admission: EM | Admit: 2020-07-18 | Discharge: 2020-07-19 | Disposition: A | Discharge status: Other Acute Inpatient Hospital | Payer: Medicare HMO | Attending: Emergency Medicine | Admitting: Emergency Medicine

## 2020-07-18 ENCOUNTER — Encounter (HOSPITAL_COMMUNITY): Payer: Self-pay | Admitting: Emergency Medicine

## 2020-07-18 ENCOUNTER — Other Ambulatory Visit: Payer: Self-pay

## 2020-07-18 DIAGNOSIS — F339 Major depressive disorder, recurrent, unspecified: Secondary | ICD-10-CM | POA: Diagnosis present

## 2020-07-18 DIAGNOSIS — Z046 Encounter for general psychiatric examination, requested by authority: Secondary | ICD-10-CM | POA: Diagnosis not present

## 2020-07-18 DIAGNOSIS — Y900 Blood alcohol level of less than 20 mg/100 ml: Secondary | ICD-10-CM | POA: Diagnosis not present

## 2020-07-18 DIAGNOSIS — R45851 Suicidal ideations: Secondary | ICD-10-CM | POA: Diagnosis not present

## 2020-07-18 DIAGNOSIS — Z20822 Contact with and (suspected) exposure to covid-19: Secondary | ICD-10-CM | POA: Insufficient documentation

## 2020-07-18 DIAGNOSIS — F0390 Unspecified dementia without behavioral disturbance: Secondary | ICD-10-CM | POA: Diagnosis not present

## 2020-07-18 DIAGNOSIS — E1169 Type 2 diabetes mellitus with other specified complication: Secondary | ICD-10-CM | POA: Insufficient documentation

## 2020-07-18 DIAGNOSIS — Z79899 Other long term (current) drug therapy: Secondary | ICD-10-CM | POA: Insufficient documentation

## 2020-07-18 DIAGNOSIS — E785 Hyperlipidemia, unspecified: Secondary | ICD-10-CM | POA: Diagnosis not present

## 2020-07-18 DIAGNOSIS — Z7984 Long term (current) use of oral hypoglycemic drugs: Secondary | ICD-10-CM | POA: Diagnosis not present

## 2020-07-18 DIAGNOSIS — I10 Essential (primary) hypertension: Secondary | ICD-10-CM | POA: Insufficient documentation

## 2020-07-18 DIAGNOSIS — F32A Depression, unspecified: Secondary | ICD-10-CM | POA: Diagnosis present

## 2020-07-18 DIAGNOSIS — Z87891 Personal history of nicotine dependence: Secondary | ICD-10-CM | POA: Insufficient documentation

## 2020-07-18 DIAGNOSIS — F332 Major depressive disorder, recurrent severe without psychotic features: Secondary | ICD-10-CM | POA: Insufficient documentation

## 2020-07-18 DIAGNOSIS — Z008 Encounter for other general examination: Secondary | ICD-10-CM

## 2020-07-18 LAB — CBC
HCT: 44.3 % (ref 36.0–46.0)
Hemoglobin: 15.2 g/dL — ABNORMAL HIGH (ref 12.0–15.0)
MCH: 31.1 pg (ref 26.0–34.0)
MCHC: 34.3 g/dL (ref 30.0–36.0)
MCV: 90.8 fL (ref 80.0–100.0)
Platelets: 201 10*3/uL (ref 150–400)
RBC: 4.88 MIL/uL (ref 3.87–5.11)
RDW: 13.1 % (ref 11.5–15.5)
WBC: 8.6 10*3/uL (ref 4.0–10.5)
nRBC: 0 % (ref 0.0–0.2)

## 2020-07-18 LAB — RAPID URINE DRUG SCREEN, HOSP PERFORMED
Amphetamines: NOT DETECTED
Barbiturates: NOT DETECTED
Benzodiazepines: NOT DETECTED
Cocaine: NOT DETECTED
Opiates: NOT DETECTED
Tetrahydrocannabinol: NOT DETECTED

## 2020-07-18 LAB — COMPREHENSIVE METABOLIC PANEL
ALT: 32 U/L (ref 0–44)
AST: 33 U/L (ref 15–41)
Albumin: 4.1 g/dL (ref 3.5–5.0)
Alkaline Phosphatase: 78 U/L (ref 38–126)
Anion gap: 10 (ref 5–15)
BUN: 20 mg/dL (ref 8–23)
CO2: 28 mmol/L (ref 22–32)
Calcium: 9.5 mg/dL (ref 8.9–10.3)
Chloride: 99 mmol/L (ref 98–111)
Creatinine, Ser: 1.08 mg/dL — ABNORMAL HIGH (ref 0.44–1.00)
GFR, Estimated: 52 mL/min — ABNORMAL LOW (ref >60)
Glucose, Bld: 192 mg/dL — ABNORMAL HIGH (ref 70–99)
Potassium: 4 mmol/L (ref 3.5–5.1)
Sodium: 137 mmol/L (ref 135–145)
Total Bilirubin: 0.5 mg/dL (ref 0.3–1.2)
Total Protein: 7.8 g/dL (ref 6.5–8.1)

## 2020-07-18 LAB — SALICYLATE LEVEL: Salicylate Lvl: <7 mg/dL — ABNORMAL LOW (ref 7.0–30.0)

## 2020-07-18 LAB — ACETAMINOPHEN LEVEL: Acetaminophen (Tylenol), Serum: <10 ug/mL — ABNORMAL LOW (ref 10–30)

## 2020-07-18 LAB — ETHANOL: Alcohol, Ethyl (B): <10 mg/dL (ref <10)

## 2020-07-18 MED ORDER — CALCIUM CARBONATE 1250 (500 CA) MG PO TABS
1.0000 | ORAL_TABLET | Freq: Two times a day (BID) | ORAL | Status: DC
  Administered 2020-07-18 – 2020-07-19 (×3): 500 mg via ORAL
  Filled 2020-07-18 (×9): qty 1

## 2020-07-18 MED ORDER — PRAVASTATIN SODIUM 10 MG PO TABS
20.0000 mg | ORAL_TABLET | Freq: Every day | ORAL | Status: DC
  Administered 2020-07-19: 20 mg via ORAL
  Filled 2020-07-18: qty 2

## 2020-07-18 MED ORDER — CITALOPRAM HYDROBROMIDE 20 MG PO TABS
40.0000 mg | ORAL_TABLET | Freq: Every day | ORAL | Status: DC
  Administered 2020-07-19: 40 mg via ORAL
  Filled 2020-07-18: qty 2

## 2020-07-18 MED ORDER — FUROSEMIDE 40 MG PO TABS
40.0000 mg | ORAL_TABLET | Freq: Every day | ORAL | Status: DC
  Administered 2020-07-19: 40 mg via ORAL
  Filled 2020-07-18 (×2): qty 1

## 2020-07-18 MED ORDER — GLIPIZIDE 5 MG PO TABS
10.0000 mg | ORAL_TABLET | Freq: Two times a day (BID) | ORAL | Status: DC
  Administered 2020-07-18 – 2020-07-19 (×2): 10 mg via ORAL
  Filled 2020-07-18 (×2): qty 2

## 2020-07-18 MED ORDER — TRAZODONE HCL 50 MG PO TABS: 100.0000 mg | ORAL_TABLET | Freq: Every evening | ORAL | Status: DC | PRN

## 2020-07-18 MED ORDER — DONEPEZIL HCL 5 MG PO TABS
10.0000 mg | ORAL_TABLET | Freq: Every day | ORAL | Status: DC
  Administered 2020-07-18: 10 mg via ORAL
  Filled 2020-07-18: qty 2

## 2020-07-18 MED ORDER — FERROUS SULFATE 325 (65 FE) MG PO TABS
325.0000 mg | ORAL_TABLET | Freq: Every day | ORAL | Status: DC
  Administered 2020-07-19: 325 mg via ORAL
  Filled 2020-07-18: qty 1

## 2020-07-18 MED ORDER — LOSARTAN POTASSIUM 25 MG PO TABS
50.0000 mg | ORAL_TABLET | Freq: Every day | ORAL | Status: DC
  Administered 2020-07-19: 50 mg via ORAL
  Filled 2020-07-18 (×2): qty 2

## 2020-07-18 MED ORDER — LINAGLIPTIN 5 MG PO TABS
5.0000 mg | ORAL_TABLET | Freq: Every day | ORAL | Status: DC
  Administered 2020-07-19: 5 mg via ORAL
  Filled 2020-07-18: qty 1

## 2020-07-18 MED ORDER — PANTOPRAZOLE SODIUM 40 MG PO TBEC
40.0000 mg | DELAYED_RELEASE_TABLET | Freq: Every day | ORAL | Status: DC
  Administered 2020-07-19: 40 mg via ORAL
  Filled 2020-07-18 (×2): qty 1

## 2020-07-18 MED ORDER — POTASSIUM CHLORIDE CRYS ER 20 MEQ PO TBCR
20.0000 meq | EXTENDED_RELEASE_TABLET | Freq: Every day | ORAL | Status: DC
  Administered 2020-07-19: 20 meq via ORAL
  Filled 2020-07-18 (×2): qty 1

## 2020-07-18 MED ORDER — CALCIUM CARBONATE 600 MG PO TABS: 600.0000 mg | ORAL_TABLET | Freq: Two times a day (BID) | ORAL | Status: DC

## 2020-07-18 NOTE — ED Triage Notes (Signed)
Pt states that she is here because her "nerves are bad". States son doesn't want her living with him anymore. When asked if pt had any thought of harming herself she stated "I always have thought like that but I promised the Lord I wouldn't try it again.

## 2020-07-18 NOTE — ED Notes (Signed)
Pt's small black purse with 1260.00 and credits cards locked with security.

## 2020-07-18 NOTE — ED Notes (Signed)
Monitor at bedside, pt being TTS'd at this time.

## 2020-07-18 NOTE — ED Provider Notes (Signed)
[facility] EMERGENCY DEPARTMENT Provider Note   CSN: [id number] Arrival date & time: 07/18/20  1933     History Chief Complaint  Patient presents with   V70.1    Roseland Braun is a 80 y.o. female.  She has a history of diabetes and depression.  She said she has had suicide attempts in the past.  She is currently living with her son, states she keeps to her room and has her own bathroom.  She got an argument today and does not want to live with him anymore.  She is fairly vague on whether she has thoughts of suicide saying she always have thoughts like that but she would not do it again.  She is asking to be put away somewhere.  The history is provided by the patient.  Mental Health Problem Presenting symptoms: depression and suicidal thoughts   Degree of incapacity (severity):  Unable to specify Onset quality:  Unable to specify Timing:  Intermittent Progression:  Unchanged Chronicity:  Recurrent Context: stressful life event   Relieved by:  Nothing Worsened by:  Family interactions Ineffective treatments:  None tried Associated symptoms: no abdominal pain, no chest pain and no headaches   Risk factors: hx of mental illness, hx of suicide attempts and neurological disease       Past Medical History:  Diagnosis Date   Arthritis    Dementia ([place])    Depression    Diabetes mellitus without complication ([place])    GERD (gastroesophageal reflux disease)    Glaucoma    Hx of adenomatous colonic polyps    Hyperlipidemia    Hypertension    Obesity     Patient Active Problem List   Diagnosis Date Noted   Confusion 07/21/2019   Pressure injury of buttock, stage 1 07/21/2019   Thyroid nodule 10/03/2018   Anemia 10/03/2018   GERD (gastroesophageal reflux disease) 09/26/2015   Hypertension associated with diabetes ([place]) 09/26/2015   Depression, recurrent ([place]) 09/26/2015   Osteoarthritis, shoulder 06/07/2015   Hyperlipidemia associated with type 2 diabetes mellitus ([place])  09/25/2014   Dementia ([place])    Diabetes mellitus without complication ([place])     Past Surgical History:  Procedure Laterality Date   ABDOMINAL HYSTERECTOMY     CHOLECYSTECTOMY     THYROID CYST EXCISION     TONSILLECTOMY AND ADENOIDECTOMY     age 51     OB History   No obstetric history on file.     Family History  Problem Relation Age of Onset   Arthritis Mother    Diabetes Mother    Diabetes Son    Hypertension Son    Hyperlipidemia Son    Hypertension Daughter     Social History   Tobacco Use   Smoking status: Former    Pack years: 0.00    Types: Cigarettes    Quit date: 07/18/1974    Years since quitting: 46.0   Smokeless tobacco: Never  Vaping Use   Vaping Use: Never used  Substance Use Topics   Alcohol use: No   Drug use: No    Home Medications Prior to Admission medications   Medication Sig Start Date End Date Taking? Authorizing Provider  blood glucose meter kit and supplies KIT Dispense based on patient and insurance preference. Use up to four times daily as directed. (FOR ICD-9 250.00, 250.01). 06/08/19   Dettinger, Fransisca Kaufmann, MD  calcium carbonate (OS-CAL) 600 MG TABS tablet Take 1 tablet (600 mg total) by mouth 2 (two)  times daily with a meal. 12/17/17   Dettinger, Elige Radon, MD  citalopram (CELEXA) 40 MG tablet Take 1 tablet (40 mg total) by mouth daily. 01/22/20   Dettinger, Elige Radon, MD  donepezil (ARICEPT) 10 MG tablet Take 1 tablet (10 mg total) by mouth at bedtime. 01/22/20   Dettinger, Elige Radon, MD  ferrous sulfate (FEROSUL) 325 (65 FE) MG tablet Take 1 tablet (325 mg total) by mouth daily with breakfast. 01/22/20   Dettinger, Elige Radon, MD  furosemide (LASIX) 40 MG tablet Take 1 tablet (40 mg total) by mouth daily. 01/22/20   Dettinger, Elige Radon, MD  glipiZIDE (GLUCOTROL) 10 MG tablet Take 1 tablet (10 mg total) by mouth 2 (two) times daily. 01/22/20   Dettinger, Elige Radon, MD  glucose blood (TRUE METRIX BLOOD GLUCOSE TEST) test strip TEST BLOOD SUGAR UP TO  FOUR TIMES DAILY Dx E11.9 07/05/20   Dettinger, Elige Radon, MD  losartan (COZAAR) 50 MG tablet Take 1 tablet (50 mg total) by mouth daily. 01/22/20   Dettinger, Elige Radon, MD  lovastatin (MEVACOR) 20 MG tablet Take 1 tablet (20 mg total) by mouth daily. 01/22/20   Dettinger, Elige Radon, MD  omeprazole (PRILOSEC) 40 MG capsule Take 1 capsule (40 mg total) by mouth daily. 01/22/20   Dettinger, Elige Radon, MD  potassium chloride 20 MEQ TBCR Take 20 mEq by mouth daily. 01/27/20   Dettinger, Elige Radon, MD  Probiotic Product (PROBIOTIC DAILY PO) Take 1 capsule by mouth daily.     [provider]  sitaGLIPtin (JANUVIA) 100 MG tablet Take 1 tablet (100 mg total) by mouth daily. 04/25/20   Dettinger, Elige Radon, MD  traZODone (DESYREL) 100 MG tablet TAKE 1 TABLET (100 MG TOTAL) BY MOUTH AT BEDTIME AS NEEDED FOR SLEEP. 12/25/19   Dettinger, Elige Radon, MD    Allergies    Patient has no known allergies.  Review of Systems   Review of Systems  Constitutional:  Negative for fever.  HENT:  Negative for sore throat.   Eyes:  Negative for visual disturbance.  Respiratory:  Negative for shortness of breath.   Cardiovascular:  Negative for chest pain.  Gastrointestinal:  Negative for abdominal pain.  Genitourinary:  Negative for dysuria.  Musculoskeletal:  Negative for gait problem.  Skin:  Negative for rash.  Neurological:  Negative for headaches.  Psychiatric/Behavioral:  Positive for suicidal ideas.    Physical Exam Updated Vital Signs BP (!) 149/80 (BP Location: Right Arm)   Pulse 80   Temp 98.3 F (36.8 C) (Oral)   Resp 18   Ht 5\' 4"  (1.626 m)   Wt 87.1 kg   SpO2 98%   BMI 32.96 kg/m   Physical Exam Vitals and nursing note reviewed.  Constitutional:      General: She is not in acute distress.    Appearance: Normal appearance. She is well-developed.  HENT:     Head: Normocephalic and atraumatic.  Eyes:     Conjunctiva/sclera: Conjunctivae normal.  Cardiovascular:     Rate and Rhythm:  Normal rate and regular rhythm.     Heart sounds: No murmur heard. Pulmonary:     Effort: Pulmonary effort is normal. No respiratory distress.     Breath sounds: Normal breath sounds.  Abdominal:     Palpations: Abdomen is soft.     Tenderness: There is no abdominal tenderness.  Musculoskeletal:        General: No deformity or signs of injury. Normal range of motion.  Cervical back: Neck supple.  Skin:    General: Skin is warm and dry.  Neurological:     General: No focal deficit present.     Mental Status: She is alert.     Gait: Gait normal.    ED Results / Procedures / Treatments   Labs (all labs ordered are listed, but only abnormal results are displayed) Labs Reviewed  COMPREHENSIVE METABOLIC PANEL - Abnormal; Notable for the following components:      Result Value   Glucose, Bld 192 (*)    Creatinine, Ser 1.08 (*)    GFR, Estimated 52 (*)    All other components within normal limits  SALICYLATE LEVEL - Abnormal; Notable for the following components:   Salicylate Lvl <7.8 (*)    All other components within normal limits  ACETAMINOPHEN LEVEL - Abnormal; Notable for the following components:   Acetaminophen (Tylenol), Serum <10 (*)    All other components within normal limits  CBC - Abnormal; Notable for the following components:   Hemoglobin 15.2 (*)    All other components within normal limits  RESP PANEL BY RT-PCR (FLU A&B, COVID) ARPGX2  ETHANOL  RAPID URINE DRUG SCREEN, HOSP PERFORMED  CBG MONITORING, ED    EKG None  Radiology No results found.  Procedures Procedures   Medications Ordered in ED Medications  citalopram (CELEXA) tablet 40 mg (40 mg Oral Given 07/19/20 0823)  donepezil (ARICEPT) tablet 10 mg (10 mg Oral Given 07/18/20 2325)  ferrous sulfate tablet 325 mg (325 mg Oral Given 07/19/20 0823)  furosemide (LASIX) tablet 40 mg (40 mg Oral Given 07/19/20 0822)  glipiZIDE (GLUCOTROL) tablet 10 mg (10 mg Oral Given 07/19/20 0823)  losartan (COZAAR)  tablet 50 mg (50 mg Oral Given 07/19/20 0822)  pravastatin (PRAVACHOL) tablet 20 mg (has no administration in time range)  pantoprazole (PROTONIX) EC tablet 40 mg (40 mg Oral Given 07/19/20 0823)  potassium chloride SA (KLOR-CON) CR tablet 20 mEq (20 mEq Oral Given 07/19/20 6767)  linagliptin (TRADJENTA) tablet 5 mg (5 mg Oral Given 07/19/20 2094)  traZODone (DESYREL) tablet 100 mg (has no administration in time range)  calcium carbonate (OS-CAL - dosed in mg of elemental calcium) tablet 500 mg of elemental calcium (500 mg of elemental calcium Oral Given 07/19/20 1002)    ED Course  I have reviewed the triage vital signs and the nursing notes.  Pertinent labs & imaging results that were available during my care of the patient were reviewed by me and considered in my medical decision making (see chart for details).    MDM Rules/Calculators/A&P                         This patient complains of suicidal thoughts and family dysfunction; this involves an extensive number of treatment Options and is a complaint that carries with it a high risk of complications and Morbidity. The differential includes suicidal, psychosocial, metabolic derangement, infection  I ordered, reviewed and interpreted labs, which included CBC with normal white count normal hemoglobin, chemistries fairly normal other than elevated blood sugar, aspirin Tylenol negative I ordered medication patient's home medication  Previous records obtained and reviewed in epic no recent admissions I consulted TTS and discussed lab and imaging findings  Critical Interventions: None  After the interventions stated above, I reevaluated the patient and found patient to be cooperative and voluntary.  Psychiatry is recommending inpatient psychiatric awaiting bed placement   Final Clinical Impression(s) / ED  Diagnoses Final diagnoses:  Encounter for psychological evaluation    Rx / DC Orders ED Discharge Orders     None        Hayden Rasmussen, MD 07/19/20 1028

## 2020-07-18 NOTE — ED Notes (Signed)
Up to bathroom, returned to room.

## 2020-07-18 NOTE — ED Notes (Signed)
Pt changed out into purple scrubs and wanded x2 by security.

## 2020-07-18 NOTE — ED Triage Notes (Signed)
Pt wanded by security. 
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
## 2020-07-19 DIAGNOSIS — E785 Hyperlipidemia, unspecified: Secondary | ICD-10-CM | POA: Diagnosis not present

## 2020-07-19 DIAGNOSIS — F0390 Unspecified dementia without behavioral disturbance: Secondary | ICD-10-CM | POA: Diagnosis not present

## 2020-07-19 DIAGNOSIS — A539 Syphilis, unspecified: Secondary | ICD-10-CM | POA: Diagnosis not present

## 2020-07-19 DIAGNOSIS — Z7984 Long term (current) use of oral hypoglycemic drugs: Secondary | ICD-10-CM | POA: Diagnosis not present

## 2020-07-19 DIAGNOSIS — N390 Urinary tract infection, site not specified: Secondary | ICD-10-CM | POA: Diagnosis not present

## 2020-07-19 DIAGNOSIS — F332 Major depressive disorder, recurrent severe without psychotic features: Secondary | ICD-10-CM | POA: Diagnosis not present

## 2020-07-19 DIAGNOSIS — R059 Cough, unspecified: Secondary | ICD-10-CM | POA: Diagnosis not present

## 2020-07-19 DIAGNOSIS — Z79899 Other long term (current) drug therapy: Secondary | ICD-10-CM | POA: Diagnosis not present

## 2020-07-19 DIAGNOSIS — K219 Gastro-esophageal reflux disease without esophagitis: Secondary | ICD-10-CM | POA: Diagnosis not present

## 2020-07-19 DIAGNOSIS — E1169 Type 2 diabetes mellitus with other specified complication: Secondary | ICD-10-CM | POA: Diagnosis not present

## 2020-07-19 DIAGNOSIS — R45851 Suicidal ideations: Secondary | ICD-10-CM

## 2020-07-19 DIAGNOSIS — R0981 Nasal congestion: Secondary | ICD-10-CM | POA: Diagnosis not present

## 2020-07-19 DIAGNOSIS — Z20822 Contact with and (suspected) exposure to covid-19: Secondary | ICD-10-CM | POA: Diagnosis not present

## 2020-07-19 DIAGNOSIS — I10 Essential (primary) hypertension: Secondary | ICD-10-CM | POA: Diagnosis not present

## 2020-07-19 DIAGNOSIS — E119 Type 2 diabetes mellitus without complications: Secondary | ICD-10-CM | POA: Diagnosis not present

## 2020-07-19 DIAGNOSIS — K0889 Other specified disorders of teeth and supporting structures: Secondary | ICD-10-CM | POA: Diagnosis not present

## 2020-07-19 DIAGNOSIS — Z712 Person consulting for explanation of examination or test findings: Secondary | ICD-10-CM | POA: Diagnosis not present

## 2020-07-19 DIAGNOSIS — Z87891 Personal history of nicotine dependence: Secondary | ICD-10-CM | POA: Diagnosis not present

## 2020-07-19 DIAGNOSIS — Z113 Encounter for screening for infections with a predominantly sexual mode of transmission: Secondary | ICD-10-CM | POA: Diagnosis not present

## 2020-07-19 LAB — RESP PANEL BY RT-PCR (FLU A&B, COVID) ARPGX2
Influenza A by PCR: NEGATIVE
Influenza B by PCR: NEGATIVE
SARS Coronavirus 2 by RT PCR: NEGATIVE

## 2020-07-19 LAB — CBG MONITORING, ED
Glucose-Capillary: 83 mg/dL (ref 70–99)
Glucose-Capillary: 161 mg/dL — ABNORMAL HIGH (ref 70–99)

## 2020-07-19 NOTE — Progress Notes (Signed)
Pt accepted to [facility]    Patient meets inpatient criteria per Fransico Meadow, NP   Dr. Julaine Fusi is the attending provider.    Call report to [phone]  Ruel Favors, RN @ [facility] notified.     Pt scheduled  to arrive at [place] by 2200.  Mariea Clonts, MSW, LCSW-A  1:35 PM 07/19/2020
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
## 2020-07-19 NOTE — ED Provider Notes (Signed)
  Provider Note MRN:  [id number]  Arrival date & time: 07/19/20    ED Course and Medical Decision Making  Assumed care from Dr. Melina Copa at shift change.  Awaiting placement for psych.  Medically cleared.  Provider changed to default at shift change.  Procedures  Final Clinical Impressions(s) / ED Diagnoses     ICD-10-CM   1. Encounter for psychological evaluation  Z00.8       ED Discharge Orders     None       Discharge Instructions   None     Barth Kirks. [place]onia Small, [place] [email or web address]    Maudie Flakes, MD 07/19/20 [phone]

## 2020-07-19 NOTE — ED Provider Notes (Signed)
Transport here to take pt to [facility].  She remains stable for transport.   Isla Pence, MD 07/19/20 [phone]

## 2020-07-19 NOTE — ED Provider Notes (Signed)
Patient excepted by [facility] geriatric unit.  Accepting physician is Dr. Julaine Fusi.  EMTALA completed.   Fredia Sorrow, MD 07/19/20 [phone]
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
## 2020-07-19 NOTE — Progress Notes (Signed)
Patient has been faxed out due to no geri psych beds being available at [facility]. Patient meets inpatient criteria per Rondall Allegra. Patient referred to the following facilities:  [facility]  [address], [place] [address] [phone]  [place]  [address] [place] [address] [phone] [place]  [place], [place] [address] [phone]  [facility]  [address] [place] [address] [phone] [facility]  [address] [place], [place] [address] [phone] [facility]  [address] [place] [address] [phone] [facility]  [address], [place] [address] [phone]  [facility]  [address], [place] [address] [phone] [place]  [address] [place] [address] [phone] [place], [place] [address] [place]  [facility]  [address], [place] [address] [phone]  [facility]  [place], [place] [address] [phone]  [facility]  [address], [place] [address] [facility]  [address], [place] [address] [phone] [facility]  [address], [place] [address] [phone]    CSW will continue to monitor disposition.    Mariea Clonts, MSW, LCSW-A  9:47 AM 07/19/2020

## 2020-07-19 NOTE — ED Notes (Signed)
Pt off unit at this time with [facility]; paperwork and all personal belongings sent with transporter, including belongings being held by security

## 2020-07-20 DIAGNOSIS — K219 Gastro-esophageal reflux disease without esophagitis: Secondary | ICD-10-CM | POA: Diagnosis not present

## 2020-07-20 DIAGNOSIS — K0889 Other specified disorders of teeth and supporting structures: Secondary | ICD-10-CM | POA: Diagnosis not present

## 2020-07-20 DIAGNOSIS — I10 Essential (primary) hypertension: Secondary | ICD-10-CM | POA: Diagnosis not present

## 2020-07-20 DIAGNOSIS — F332 Major depressive disorder, recurrent severe without psychotic features: Secondary | ICD-10-CM | POA: Diagnosis not present

## 2020-07-20 DIAGNOSIS — E119 Type 2 diabetes mellitus without complications: Secondary | ICD-10-CM | POA: Diagnosis not present

## 2020-07-20 DIAGNOSIS — A539 Syphilis, unspecified: Secondary | ICD-10-CM | POA: Diagnosis not present

## 2020-07-20 DIAGNOSIS — N390 Urinary tract infection, site not specified: Secondary | ICD-10-CM | POA: Diagnosis not present

## 2020-07-20 DIAGNOSIS — Z79899 Other long term (current) drug therapy: Secondary | ICD-10-CM | POA: Diagnosis not present

## 2020-07-21 DIAGNOSIS — E119 Type 2 diabetes mellitus without complications: Secondary | ICD-10-CM | POA: Diagnosis not present

## 2020-07-21 DIAGNOSIS — I10 Essential (primary) hypertension: Secondary | ICD-10-CM | POA: Diagnosis not present

## 2020-07-21 DIAGNOSIS — F332 Major depressive disorder, recurrent severe without psychotic features: Secondary | ICD-10-CM | POA: Diagnosis not present

## 2020-07-22 DIAGNOSIS — I10 Essential (primary) hypertension: Secondary | ICD-10-CM | POA: Diagnosis not present

## 2020-07-22 DIAGNOSIS — E119 Type 2 diabetes mellitus without complications: Secondary | ICD-10-CM | POA: Diagnosis not present

## 2020-07-22 DIAGNOSIS — F332 Major depressive disorder, recurrent severe without psychotic features: Secondary | ICD-10-CM | POA: Diagnosis not present

## 2020-07-23 ENCOUNTER — Ambulatory Visit (HOSPITAL_COMMUNITY): Payer: Medicare HMO

## 2020-07-23 DIAGNOSIS — Z712 Person consulting for explanation of examination or test findings: Secondary | ICD-10-CM | POA: Diagnosis not present

## 2020-07-23 DIAGNOSIS — I10 Essential (primary) hypertension: Secondary | ICD-10-CM | POA: Diagnosis not present

## 2020-07-23 DIAGNOSIS — E119 Type 2 diabetes mellitus without complications: Secondary | ICD-10-CM | POA: Diagnosis not present

## 2020-07-23 DIAGNOSIS — F332 Major depressive disorder, recurrent severe without psychotic features: Secondary | ICD-10-CM | POA: Diagnosis not present

## 2020-07-24 DIAGNOSIS — I10 Essential (primary) hypertension: Secondary | ICD-10-CM | POA: Diagnosis not present

## 2020-07-24 DIAGNOSIS — F332 Major depressive disorder, recurrent severe without psychotic features: Secondary | ICD-10-CM | POA: Diagnosis not present

## 2020-07-24 DIAGNOSIS — E119 Type 2 diabetes mellitus without complications: Secondary | ICD-10-CM | POA: Diagnosis not present

## 2020-07-25 DIAGNOSIS — I10 Essential (primary) hypertension: Secondary | ICD-10-CM | POA: Diagnosis not present

## 2020-07-25 DIAGNOSIS — E119 Type 2 diabetes mellitus without complications: Secondary | ICD-10-CM | POA: Diagnosis not present

## 2020-07-25 DIAGNOSIS — F332 Major depressive disorder, recurrent severe without psychotic features: Secondary | ICD-10-CM | POA: Diagnosis not present

## 2020-07-26 ENCOUNTER — Ambulatory Visit: Payer: Medicare HMO | Admitting: Family Medicine

## 2020-07-26 DIAGNOSIS — F332 Major depressive disorder, recurrent severe without psychotic features: Secondary | ICD-10-CM | POA: Diagnosis not present

## 2020-07-27 DIAGNOSIS — R059 Cough, unspecified: Secondary | ICD-10-CM | POA: Diagnosis not present

## 2020-07-27 DIAGNOSIS — E119 Type 2 diabetes mellitus without complications: Secondary | ICD-10-CM | POA: Diagnosis not present

## 2020-07-27 DIAGNOSIS — I10 Essential (primary) hypertension: Secondary | ICD-10-CM | POA: Diagnosis not present

## 2020-07-27 DIAGNOSIS — F332 Major depressive disorder, recurrent severe without psychotic features: Secondary | ICD-10-CM | POA: Diagnosis not present

## 2020-07-27 DIAGNOSIS — R0981 Nasal congestion: Secondary | ICD-10-CM | POA: Diagnosis not present

## 2020-07-27 DIAGNOSIS — E785 Hyperlipidemia, unspecified: Secondary | ICD-10-CM | POA: Diagnosis not present

## 2020-07-27 DIAGNOSIS — K219 Gastro-esophageal reflux disease without esophagitis: Secondary | ICD-10-CM | POA: Diagnosis not present

## 2020-08-14 DIAGNOSIS — K219 Gastro-esophageal reflux disease without esophagitis: Secondary | ICD-10-CM | POA: Diagnosis not present

## 2020-08-14 DIAGNOSIS — I10 Essential (primary) hypertension: Secondary | ICD-10-CM | POA: Diagnosis not present

## 2020-08-14 DIAGNOSIS — R600 Localized edema: Secondary | ICD-10-CM | POA: Diagnosis not present

## 2020-08-14 DIAGNOSIS — E119 Type 2 diabetes mellitus without complications: Secondary | ICD-10-CM | POA: Diagnosis not present

## 2020-09-12 ENCOUNTER — Encounter: Payer: Self-pay | Admitting: Family Medicine

## 2020-09-13 ENCOUNTER — Other Ambulatory Visit: Payer: Self-pay | Admitting: *Deleted

## 2020-09-13 DIAGNOSIS — F339 Major depressive disorder, recurrent, unspecified: Secondary | ICD-10-CM

## 2020-09-13 DIAGNOSIS — K219 Gastro-esophageal reflux disease without esophagitis: Secondary | ICD-10-CM

## 2020-09-13 DIAGNOSIS — E119 Type 2 diabetes mellitus without complications: Secondary | ICD-10-CM

## 2020-09-13 MED ORDER — SITAGLIPTIN PHOSPHATE 100 MG PO TABS: 100.0000 mg | ORAL_TABLET | Freq: Every day | ORAL | 0 refills | Status: DC

## 2020-09-13 MED ORDER — GLIPIZIDE 10 MG PO TABS: 10.0000 mg | ORAL_TABLET | Freq: Two times a day (BID) | ORAL | 0 refills | Status: DC

## 2020-09-13 MED ORDER — FERROUS SULFATE 325 (65 FE) MG PO TABS: 325.0000 mg | ORAL_TABLET | Freq: Every day | ORAL | 0 refills | Status: DC

## 2020-09-13 MED ORDER — LOSARTAN POTASSIUM 50 MG PO TABS: 50.0000 mg | ORAL_TABLET | Freq: Every day | ORAL | 0 refills | Status: DC

## 2020-09-13 MED ORDER — POTASSIUM CHLORIDE ER 20 MEQ PO TBCR: 20.0000 meq | EXTENDED_RELEASE_TABLET | Freq: Every day | ORAL | 0 refills | Status: DC

## 2020-09-13 MED ORDER — OMEPRAZOLE 40 MG PO CPDR: 40.0000 mg | DELAYED_RELEASE_CAPSULE | Freq: Every day | ORAL | 0 refills | Status: DC

## 2020-09-13 MED ORDER — CITALOPRAM HYDROBROMIDE 40 MG PO TABS: 40.0000 mg | ORAL_TABLET | Freq: Every day | ORAL | 0 refills | Status: DC

## 2020-09-13 MED ORDER — DONEPEZIL HCL 10 MG PO TABS
10.0000 mg | ORAL_TABLET | Freq: Every day | ORAL | 0 refills | Status: DC
Start: 2020-09-13 — End: 2020-09-18

## 2020-09-13 MED ORDER — TRAZODONE HCL 100 MG PO TABS: 100.0000 mg | ORAL_TABLET | Freq: Every evening | ORAL | 0 refills | Status: DC | PRN

## 2020-09-13 MED ORDER — LOVASTATIN 20 MG PO TABS: 20.0000 mg | ORAL_TABLET | Freq: Every day | ORAL | 0 refills | Status: DC

## 2020-09-13 MED ORDER — FUROSEMIDE 40 MG PO TABS: 40.0000 mg | ORAL_TABLET | Freq: Every day | ORAL | 0 refills | Status: DC

## 2020-09-18 ENCOUNTER — Other Ambulatory Visit: Payer: Self-pay

## 2020-09-18 ENCOUNTER — Ambulatory Visit (INDEPENDENT_AMBULATORY_CARE_PROVIDER_SITE_OTHER): Payer: Medicare HMO | Admitting: Family Medicine

## 2020-09-18 ENCOUNTER — Encounter: Payer: Self-pay | Admitting: Family Medicine

## 2020-09-18 VITALS — BP 110/65 | HR 78 | Ht 64.0 in | Wt 168.0 lb

## 2020-09-18 DIAGNOSIS — K219 Gastro-esophageal reflux disease without esophagitis: Secondary | ICD-10-CM | POA: Diagnosis not present

## 2020-09-18 DIAGNOSIS — R591 Generalized enlarged lymph nodes: Secondary | ICD-10-CM | POA: Diagnosis not present

## 2020-09-18 DIAGNOSIS — I152 Hypertension secondary to endocrine disorders: Secondary | ICD-10-CM

## 2020-09-18 DIAGNOSIS — E1159 Type 2 diabetes mellitus with other circulatory complications: Secondary | ICD-10-CM | POA: Diagnosis not present

## 2020-09-18 DIAGNOSIS — F0280 Dementia in other diseases classified elsewhere without behavioral disturbance: Secondary | ICD-10-CM

## 2020-09-18 DIAGNOSIS — F339 Major depressive disorder, recurrent, unspecified: Secondary | ICD-10-CM

## 2020-09-18 DIAGNOSIS — E1169 Type 2 diabetes mellitus with other specified complication: Secondary | ICD-10-CM

## 2020-09-18 DIAGNOSIS — E119 Type 2 diabetes mellitus without complications: Secondary | ICD-10-CM | POA: Diagnosis not present

## 2020-09-18 DIAGNOSIS — E785 Hyperlipidemia, unspecified: Secondary | ICD-10-CM | POA: Diagnosis not present

## 2020-09-18 DIAGNOSIS — G301 Alzheimer's disease with late onset: Secondary | ICD-10-CM

## 2020-09-18 LAB — BAYER DCA HB A1C WAIVED: HB A1C (BAYER DCA - WAIVED): 5.1 % (ref 4.8–5.6)

## 2020-09-18 MED ORDER — TRAZODONE HCL 100 MG PO TABS: 100.0000 mg | ORAL_TABLET | Freq: Every evening | ORAL | 0 refills | Status: DC | PRN

## 2020-09-18 MED ORDER — GLIPIZIDE 10 MG PO TABS: 10.0000 mg | ORAL_TABLET | Freq: Two times a day (BID) | ORAL | 0 refills | Status: DC

## 2020-09-18 MED ORDER — MEMANTINE HCL ER 14 MG PO CP24: 14.0000 mg | ORAL_CAPSULE | Freq: Every day | ORAL | 0 refills | Status: DC

## 2020-09-18 MED ORDER — DONEPEZIL HCL 10 MG PO TABS: 10.0000 mg | ORAL_TABLET | Freq: Every day | ORAL | 0 refills | Status: DC

## 2020-09-18 MED ORDER — POTASSIUM CHLORIDE ER 20 MEQ PO TBCR: 20.0000 meq | EXTENDED_RELEASE_TABLET | Freq: Every day | ORAL | 0 refills | Status: DC

## 2020-09-18 MED ORDER — SITAGLIPTIN PHOSPHATE 100 MG PO TABS: 100.0000 mg | ORAL_TABLET | Freq: Every day | ORAL | 0 refills | Status: DC

## 2020-09-18 MED ORDER — LOSARTAN POTASSIUM 50 MG PO TABS: 50.0000 mg | ORAL_TABLET | Freq: Every day | ORAL | 0 refills | Status: DC

## 2020-09-18 MED ORDER — PRAVASTATIN SODIUM 40 MG PO TABS
40.0000 mg | ORAL_TABLET | Freq: Every day | ORAL | 0 refills | Status: DC
Start: 2020-09-18 — End: 2020-11-29

## 2020-09-18 MED ORDER — OMEPRAZOLE 40 MG PO CPDR: 40.0000 mg | DELAYED_RELEASE_CAPSULE | Freq: Every day | ORAL | 0 refills | Status: DC

## 2020-09-18 MED ORDER — FLUVOXAMINE MALEATE 100 MG PO TABS
100.0000 mg | ORAL_TABLET | Freq: Every day | ORAL | 0 refills | Status: DC
Start: 2020-09-18 — End: 2020-11-29

## 2020-09-18 MED ORDER — FUROSEMIDE 40 MG PO TABS: 40.0000 mg | ORAL_TABLET | Freq: Every day | ORAL | 0 refills | Status: DC

## 2020-09-18 NOTE — Progress Notes (Signed)
BP 110/65   Pulse 78   Ht $R'5\' 4"'ad$  (1.626 m)   Wt 168 lb (76.2 kg)   SpO2 94%   BMI 28.84 kg/m    Subjective:   Patient ID: Linda Fletcher, female    DOB: May 18, 1940, 80 y.o.   MRN: [id number]  HPI: Linda Fletcher is a 80 y.o. female presenting on 09/18/2020 for Medical Management of Chronic Issues (Discuss which medications she should be taking) and nodule (Post left scalp- Needs new CT order)   HPI Patient has nodule on the posterior left part of her scalp that the lump that is been there that been bothering her, we tried to order a scan before but she is not able to get it and just wanted to come in to get the scan ordered.  Type 2 diabetes mellitus Patient comes in today for recheck of his diabetes. Patient has been currently taking glipizide and Januvia. Patient is currently on an ACE inhibitor/ARB. Patient has not seen an ophthalmologist this year. Patient denies any issues with their feet. The symptom started onset as an adult hypertension and hyperlipidemia ARE RELATED TO DM   Hypertension Patient is currently on losartan and furosemide, and their blood pressure today is 110/65. Patient denies any lightheadedness or dizziness. Patient denies headaches, blurred vision, chest pains, shortness of breath, or weakness. Denies any side effects from medication and is content with current medication.   Hyperlipidemia Patient is coming in for recheck of his hyperlipidemia. The patient is currently taking pravastatin. They deny any issues with myalgias or history of liver damage from it. They deny any focal numbness or weakness or chest pain.   GERD Patient is currently on omeprazole.  She denies any major symptoms or abdominal pain or belching or burping. She denies any blood in her stool or lightheadedness or dizziness.   Alzheimer's recheck Patient is back out of rehab nursing home and back at home and is currently on medicine including donepezil for Alzheimer's.  She is also on Namenda  and her son who is here with her has difficulty telling which is helping or if any.  Relevant past medical, surgical, family and social history reviewed and updated as indicated. Interim medical history since our last visit reviewed. Allergies and medications reviewed and updated.  Review of Systems  Constitutional:  Negative for chills and fever.  Eyes:  Negative for visual disturbance.  Respiratory:  Negative for chest tightness and shortness of breath.   Cardiovascular:  Negative for chest pain and leg swelling.  Musculoskeletal:  Negative for back pain and gait problem.  Skin:  Negative for color change and rash.  Neurological:  Negative for dizziness, light-headedness and headaches.  Psychiatric/Behavioral:  Positive for confusion, dysphoric mood and sleep disturbance. Negative for agitation, behavioral problems, self-injury and suicidal ideas. The patient is nervous/anxious.   All other systems reviewed and are negative.  Per HPI unless specifically indicated above   Allergies as of 09/18/2020   No Known Allergies      Medication List        Accurate as of September 18, 2020  2:01 PM. If you have any questions, ask your nurse or doctor.          STOP taking these medications    citalopram 40 MG tablet Commonly known as: CELEXA Stopped by: Fransisca Kaufmann Nakaiya Beddow, MD   ferrous sulfate 325 (65 FE) MG tablet Commonly known as: FeroSul Stopped by: Worthy Rancher, MD   lovastatin 20 MG tablet  Commonly known as: MEVACOR Stopped by: Nils Pyle, MD       TAKE these medications    blood glucose meter kit and supplies Kit Dispense based on patient and insurance preference. Use up to four times daily as directed. (FOR ICD-9 250.00, 250.01).   calcium carbonate 600 MG Tabs tablet Commonly known as: OS-CAL Take 1 tablet (600 mg total) by mouth 2 (two) times daily with a meal.   donepezil 10 MG tablet Commonly known as: ARICEPT Take 1 tablet (10 mg total) by  mouth at bedtime.   fluvoxaMINE 100 MG tablet Commonly known as: LUVOX Take 1 tablet (100 mg total) by mouth daily. Started by: Nils Pyle, MD   furosemide 40 MG tablet Commonly known as: LASIX Take 1 tablet (40 mg total) by mouth daily.   glipiZIDE 10 MG tablet Commonly known as: GLUCOTROL Take 1 tablet (10 mg total) by mouth 2 (two) times daily.   losartan 50 MG tablet Commonly known as: COZAAR Take 1 tablet (50 mg total) by mouth daily.   memantine 14 MG Cp24 24 hr capsule Commonly known as: NAMENDA XR Take 1 capsule (14 mg total) by mouth daily. Started by: Nils Pyle, MD   omeprazole 40 MG capsule Commonly known as: PRILOSEC Take 1 capsule (40 mg total) by mouth daily.   Potassium Chloride ER 20 MEQ Tbcr Take 20 mEq by mouth daily.   pravastatin 40 MG tablet Commonly known as: PRAVACHOL Take 1 tablet (40 mg total) by mouth daily. Started by: Elige Radon Faylynn Stamos, MD   PROBIOTIC DAILY PO Take 1 capsule by mouth daily.   sitaGLIPtin 100 MG tablet Commonly known as: Januvia Take 1 tablet (100 mg total) by mouth daily.   traZODone 100 MG tablet Commonly known as: DESYREL Take 1 tablet (100 mg total) by mouth at bedtime as needed for sleep.   True Metrix Blood Glucose Test test strip Generic drug: glucose blood TEST BLOOD SUGAR UP TO FOUR TIMES DAILY Dx E11.9         Objective:   BP 110/65   Pulse 78   Ht 5\' 4"  (1.626 m)   Wt 168 lb (76.2 kg)   SpO2 94%   BMI 28.84 kg/m   Wt Readings from Last 3 Encounters:  09/18/20 168 lb (76.2 kg)  07/18/20 192 lb (87.1 kg)  06/26/20 190 lb (86.2 kg)    Physical Exam Vitals and nursing note reviewed.  Constitutional:      General: She is not in acute distress.    Appearance: She is well-developed. She is not diaphoretic.  Eyes:     Conjunctiva/sclera: Conjunctivae normal.     Pupils: Pupils are equal, round, and reactive to light.  Cardiovascular:     Rate and Rhythm: Normal rate and  regular rhythm.     Heart sounds: Normal heart sounds. No murmur heard. Pulmonary:     Effort: Pulmonary effort is normal. No respiratory distress.     Breath sounds: Normal breath sounds. No wheezing.  Musculoskeletal:        General: No tenderness. Normal range of motion.  Skin:    General: Skin is warm and dry.     Findings: Lesion (Soft tissue skin lesion, mobile and palpable on the posterior scalp at the nape of the neck) present. No rash.  Neurological:     Mental Status: She is alert and oriented to person, place, and time.     Coordination: Coordination normal.  Psychiatric:  Behavior: Behavior normal.      Assessment & Plan:   Problem List Items Addressed This Visit       Cardiovascular and Mediastinum   Hypertension associated with diabetes ([place])   Relevant Medications   sitaGLIPtin (JANUVIA) 100 MG tablet   glipiZIDE (GLUCOTROL) 10 MG tablet   furosemide (LASIX) 40 MG tablet   losartan (COZAAR) 50 MG tablet   pravastatin (PRAVACHOL) 40 MG tablet   Other Relevant Orders   CMP14+EGFR (Completed)     Digestive   GERD (gastroesophageal reflux disease)   Relevant Medications   omeprazole (PRILOSEC) 40 MG capsule     Endocrine   Diabetes mellitus without complication (HCC) - Primary   Relevant Medications   sitaGLIPtin (JANUVIA) 100 MG tablet   glipiZIDE (GLUCOTROL) 10 MG tablet   losartan (COZAAR) 50 MG tablet   pravastatin (PRAVACHOL) 40 MG tablet   Other Relevant Orders   Bayer DCA Hb A1c Waived (Completed)   CBC with Differential/Platelet (Completed)   Hyperlipidemia associated with type 2 diabetes mellitus (HCC)   Relevant Medications   sitaGLIPtin (JANUVIA) 100 MG tablet   glipiZIDE (GLUCOTROL) 10 MG tablet   losartan (COZAAR) 50 MG tablet   pravastatin (PRAVACHOL) 40 MG tablet   Other Relevant Orders   Lipid panel (Completed)     Nervous and Auditory   Dementia (HCC)   Relevant Medications   traZODone (DESYREL) 100 MG tablet   memantine  (NAMENDA XR) 14 MG CP24 24 hr capsule   donepezil (ARICEPT) 10 MG tablet   fluvoxaMINE (LUVOX) 100 MG tablet     Other   Depression, recurrent (HCC)   Relevant Medications   traZODone (DESYREL) 100 MG tablet   fluvoxaMINE (LUVOX) 100 MG tablet   Other Visit Diagnoses     Lymphadenopathy       Relevant Orders   CT Soft Tissue Neck W Contrast     Will order CT scan, continue current medicine except for stop the metformin from the rehab center.  Follow up plan: Return if symptoms worsen or fail to improve, for 2.5 months diabetes.  Counseling provided for all of the vaccine components Orders Placed This Encounter  Procedures   CT Soft Tissue Neck W Contrast   Bayer DCA Hb A1c Waived   CBC with Differential/Platelet   CMP14+EGFR   Lipid panel    Caryl Pina, MD [facility] 09/18/2020, 2:01 PM

## 2020-09-19 LAB — CBC WITH DIFFERENTIAL/PLATELET
Basophils Absolute: 0 10*3/uL (ref 0.0–0.2)
Basos: 0 %
EOS (ABSOLUTE): 0.1 10*3/uL (ref 0.0–0.4)
Eos: 2 %
Hematocrit: 43 % (ref 34.0–46.6)
Hemoglobin: 14.7 g/dL (ref 11.1–15.9)
Immature Grans (Abs): 0 10*3/uL (ref 0.0–0.1)
Immature Granulocytes: 1 %
Lymphocytes Absolute: 1.3 10*3/uL (ref 0.7–3.1)
Lymphs: 25 %
MCH: 31.5 pg (ref 26.6–33.0)
MCHC: 34.2 g/dL (ref 31.5–35.7)
MCV: 92 fL (ref 79–97)
Monocytes Absolute: 0.6 10*3/uL (ref 0.1–0.9)
Monocytes: 11 %
Neutrophils Absolute: 3.4 10*3/uL (ref 1.4–7.0)
Neutrophils: 61 %
Platelets: 133 10*3/uL — ABNORMAL LOW (ref 150–450)
RBC: 4.66 x10E6/uL (ref 3.77–5.28)
RDW: 13.6 % (ref 11.7–15.4)
WBC: 5.4 10*3/uL (ref 3.4–10.8)

## 2020-09-19 LAB — LIPID PANEL
Chol/HDL Ratio: 4.1 ratio (ref 0.0–4.4)
Cholesterol, Total: 165 mg/dL (ref 100–199)
HDL: 40 mg/dL (ref >39)
LDL Chol Calc (NIH): 79 mg/dL (ref 0–99)
Triglycerides: 279 mg/dL — ABNORMAL HIGH (ref 0–149)
VLDL Cholesterol Cal: 46 mg/dL — ABNORMAL HIGH (ref 5–40)

## 2020-09-19 LAB — CMP14+EGFR
ALT: 15 IU/L (ref 0–32)
AST: 26 IU/L (ref 0–40)
Albumin/Globulin Ratio: 1.5 (ref 1.2–2.2)
Albumin: 4.1 g/dL (ref 3.6–4.6)
Alkaline Phosphatase: 64 IU/L (ref 44–121)
BUN/Creatinine Ratio: 17 (ref 12–28)
BUN: 23 mg/dL (ref 8–27)
Bilirubin Total: 0.2 mg/dL (ref 0.0–1.2)
CO2: 27 mmol/L (ref 20–29)
Calcium: 10 mg/dL (ref 8.7–10.3)
Chloride: 95 mmol/L — ABNORMAL LOW (ref 96–106)
Creatinine, Ser: 1.35 mg/dL — ABNORMAL HIGH (ref 0.57–1.00)
Globulin, Total: 2.7 g/dL (ref 1.5–4.5)
Glucose: 137 mg/dL — ABNORMAL HIGH (ref 65–99)
Potassium: 4.1 mmol/L (ref 3.5–5.2)
Sodium: 139 mmol/L (ref 134–144)
Total Protein: 6.8 g/dL (ref 6.0–8.5)
eGFR: 39 mL/min/{1.73_m2} — ABNORMAL LOW (ref >59)

## 2020-09-20 ENCOUNTER — Other Ambulatory Visit: Payer: Self-pay

## 2020-09-20 ENCOUNTER — Emergency Department (HOSPITAL_COMMUNITY): Payer: Medicare HMO

## 2020-09-20 ENCOUNTER — Telehealth: Payer: Self-pay | Admitting: Family Medicine

## 2020-09-20 ENCOUNTER — Encounter (HOSPITAL_COMMUNITY): Payer: Self-pay | Admitting: Emergency Medicine

## 2020-09-20 ENCOUNTER — Emergency Department (HOSPITAL_COMMUNITY)
Admission: EM | Admit: 2020-09-20 | Discharge: 2020-09-20 | Disposition: A | Discharge status: Home / Self Care | Payer: Medicare HMO | Attending: Emergency Medicine | Admitting: Emergency Medicine

## 2020-09-20 DIAGNOSIS — Z7984 Long term (current) use of oral hypoglycemic drugs: Secondary | ICD-10-CM | POA: Diagnosis not present

## 2020-09-20 DIAGNOSIS — S0181XA Laceration without foreign body of other part of head, initial encounter: Secondary | ICD-10-CM | POA: Diagnosis not present

## 2020-09-20 DIAGNOSIS — I10 Essential (primary) hypertension: Secondary | ICD-10-CM | POA: Diagnosis not present

## 2020-09-20 DIAGNOSIS — W19XXXA Unspecified fall, initial encounter: Secondary | ICD-10-CM

## 2020-09-20 DIAGNOSIS — M50322 Other cervical disc degeneration at C5-C6 level: Secondary | ICD-10-CM | POA: Diagnosis not present

## 2020-09-20 DIAGNOSIS — Z87891 Personal history of nicotine dependence: Secondary | ICD-10-CM | POA: Diagnosis not present

## 2020-09-20 DIAGNOSIS — Y92012 Bathroom of single-family (private) house as the place of occurrence of the external cause: Secondary | ICD-10-CM | POA: Diagnosis not present

## 2020-09-20 DIAGNOSIS — F0390 Unspecified dementia without behavioral disturbance: Secondary | ICD-10-CM | POA: Insufficient documentation

## 2020-09-20 DIAGNOSIS — Z79899 Other long term (current) drug therapy: Secondary | ICD-10-CM | POA: Insufficient documentation

## 2020-09-20 DIAGNOSIS — S0993XA Unspecified injury of face, initial encounter: Secondary | ICD-10-CM | POA: Diagnosis not present

## 2020-09-20 DIAGNOSIS — R0902 Hypoxemia: Secondary | ICD-10-CM | POA: Diagnosis not present

## 2020-09-20 DIAGNOSIS — Z043 Encounter for examination and observation following other accident: Secondary | ICD-10-CM | POA: Diagnosis not present

## 2020-09-20 DIAGNOSIS — M50321 Other cervical disc degeneration at C4-C5 level: Secondary | ICD-10-CM | POA: Diagnosis not present

## 2020-09-20 DIAGNOSIS — E119 Type 2 diabetes mellitus without complications: Secondary | ICD-10-CM | POA: Insufficient documentation

## 2020-09-20 DIAGNOSIS — W01198A Fall on same level from slipping, tripping and stumbling with subsequent striking against other object, initial encounter: Secondary | ICD-10-CM | POA: Insufficient documentation

## 2020-09-20 DIAGNOSIS — R000 Tachycardia, unspecified: Secondary | ICD-10-CM | POA: Diagnosis not present

## 2020-09-20 DIAGNOSIS — R52 Pain, unspecified: Secondary | ICD-10-CM | POA: Diagnosis not present

## 2020-09-20 DIAGNOSIS — E041 Nontoxic single thyroid nodule: Secondary | ICD-10-CM | POA: Diagnosis not present

## 2020-09-20 DIAGNOSIS — R58 Hemorrhage, not elsewhere classified: Secondary | ICD-10-CM | POA: Diagnosis not present

## 2020-09-20 LAB — CBC WITH DIFFERENTIAL/PLATELET
Abs Immature Granulocytes: 0.02 10*3/uL (ref 0.00–0.07)
Basophils Absolute: 0 10*3/uL (ref 0.0–0.1)
Basophils Relative: 0 %
Eosinophils Absolute: 0.1 10*3/uL (ref 0.0–0.5)
Eosinophils Relative: 1 %
HCT: 41.1 % (ref 36.0–46.0)
Hemoglobin: 13.6 g/dL (ref 12.0–15.0)
Immature Granulocytes: 0 %
Lymphocytes Relative: 18 %
Lymphs Abs: 0.9 10*3/uL (ref 0.7–4.0)
MCH: 30.7 pg (ref 26.0–34.0)
MCHC: 33.1 g/dL (ref 30.0–36.0)
MCV: 92.8 fL (ref 80.0–100.0)
Monocytes Absolute: 0.5 10*3/uL (ref 0.1–1.0)
Monocytes Relative: 10 %
Neutro Abs: 3.4 10*3/uL (ref 1.7–7.7)
Neutrophils Relative %: 71 %
Platelets: 121 10*3/uL — ABNORMAL LOW (ref 150–400)
RBC: 4.43 MIL/uL (ref 3.87–5.11)
RDW: 13.2 % (ref 11.5–15.5)
WBC: 4.9 10*3/uL (ref 4.0–10.5)
nRBC: 0 % (ref 0.0–0.2)

## 2020-09-20 LAB — COMPREHENSIVE METABOLIC PANEL
ALT: 21 U/L (ref 0–44)
AST: 34 U/L (ref 15–41)
Albumin: 3.4 g/dL — ABNORMAL LOW (ref 3.5–5.0)
Alkaline Phosphatase: 55 U/L (ref 38–126)
Anion gap: 2 — ABNORMAL LOW (ref 5–15)
BUN: 18 mg/dL (ref 8–23)
CO2: 38 mmol/L — ABNORMAL HIGH (ref 22–32)
Calcium: 8.4 mg/dL — ABNORMAL LOW (ref 8.9–10.3)
Chloride: 98 mmol/L (ref 98–111)
Creatinine, Ser: 0.93 mg/dL (ref 0.44–1.00)
GFR, Estimated: >60 mL/min (ref >60)
Glucose, Bld: 155 mg/dL — ABNORMAL HIGH (ref 70–99)
Potassium: 3.2 mmol/L — ABNORMAL LOW (ref 3.5–5.1)
Sodium: 138 mmol/L (ref 135–145)
Total Bilirubin: 0.4 mg/dL (ref 0.3–1.2)
Total Protein: 6.9 g/dL (ref 6.5–8.1)

## 2020-09-20 NOTE — ED Provider Notes (Signed)
[facility] EMERGENCY DEPARTMENT Provider Note   CSN: [id number] Arrival date & time: 09/20/20  1135     History Chief Complaint  Patient presents with   Lytle Michaels    Linda Fletcher is a 80 y.o. female.  Patient with a fall.  Patient hit her head and face.  No loss consciousness.  Patient was in the bathroom on the commode and lost her balance her face no loss of conscious  The history is provided by the patient and medical records.  Fall This is a new problem. The current episode started less than 1 hour ago. The problem occurs constantly. The problem has been resolved. Pertinent negatives include no chest pain, no abdominal pain and no headaches. Nothing aggravates the symptoms. Nothing relieves the symptoms. She has tried nothing for the symptoms. The treatment provided no relief.      Past Medical History:  Diagnosis Date   Arthritis    Dementia ([place])    Depression    Diabetes mellitus without complication ([place])    GERD (gastroesophageal reflux disease)    Glaucoma    Hx of adenomatous colonic polyps    Hyperlipidemia    Hypertension    Obesity     Patient Active Problem List   Diagnosis Date Noted   Suicidal ideation    Pressure injury of buttock, stage 1 07/21/2019   Thyroid nodule 10/03/2018   Anemia 10/03/2018   GERD (gastroesophageal reflux disease) 09/26/2015   Hypertension associated with diabetes ([place]) 09/26/2015   Depression, recurrent ([place]) 09/26/2015   Osteoarthritis, shoulder 06/07/2015   Hyperlipidemia associated with type 2 diabetes mellitus ([place]) 09/25/2014   Dementia ([place])    Diabetes mellitus without complication ([place])     Past Surgical History:  Procedure Laterality Date   ABDOMINAL HYSTERECTOMY     CHOLECYSTECTOMY     THYROID CYST EXCISION     TONSILLECTOMY AND ADENOIDECTOMY     age 24     OB History   No obstetric history on file.     Family History  Problem Relation Age of Onset   Arthritis Mother    Diabetes Mother    Diabetes  Son    Hypertension Son    Hyperlipidemia Son    Hypertension Daughter     Social History   Tobacco Use   Smoking status: Former    Types: Cigarettes    Quit date: 07/18/1974    Years since quitting: 46.2   Smokeless tobacco: Never  Vaping Use   Vaping Use: Never used  Substance Use Topics   Alcohol use: No   Drug use: No    Home Medications Prior to Admission medications   Medication Sig Start Date End Date Taking? Authorizing Provider  calcium carbonate (OS-CAL) 600 MG TABS tablet Take 1 tablet (600 mg total) by mouth 2 (two) times daily with a meal. 12/17/17  Yes Dettinger, Fransisca Kaufmann, MD  donepezil (ARICEPT) 10 MG tablet Take 1 tablet (10 mg total) by mouth at bedtime. 09/18/20  Yes Dettinger, Fransisca Kaufmann, MD  fluvoxaMINE (LUVOX) 100 MG tablet Take 1 tablet (100 mg total) by mouth daily. 09/18/20  Yes Dettinger, Fransisca Kaufmann, MD  furosemide (LASIX) 40 MG tablet Take 1 tablet (40 mg total) by mouth daily. 09/18/20  Yes Dettinger, Fransisca Kaufmann, MD  glipiZIDE (GLUCOTROL) 10 MG tablet Take 1 tablet (10 mg total) by mouth 2 (two) times daily. 09/18/20  Yes Dettinger, Fransisca Kaufmann, MD  losartan (COZAAR) 50 MG tablet Take 1 tablet (50 mg  total) by mouth daily. 09/18/20  Yes Dettinger, Fransisca Kaufmann, MD  memantine (NAMENDA XR) 14 MG CP24 24 hr capsule Take 1 capsule (14 mg total) by mouth daily. 09/18/20  Yes Dettinger, Fransisca Kaufmann, MD  omeprazole (PRILOSEC) 40 MG capsule Take 1 capsule (40 mg total) by mouth daily. 09/18/20  Yes Dettinger, Fransisca Kaufmann, MD  Potassium Chloride ER 20 MEQ TBCR Take 20 mEq by mouth daily. 09/18/20  Yes Dettinger, Fransisca Kaufmann, MD  Probiotic Product (PROBIOTIC DAILY PO) Take 1 capsule by mouth daily.    Yes [provider]  sitaGLIPtin (JANUVIA) 100 MG tablet Take 1 tablet (100 mg total) by mouth daily. 09/18/20  Yes Dettinger, Fransisca Kaufmann, MD  blood glucose meter kit and supplies KIT Dispense based on patient and insurance preference. Use up to four times daily as directed. (FOR ICD-9 250.00, 250.01).  06/08/19   Dettinger, Fransisca Kaufmann, MD  glucose blood (TRUE METRIX BLOOD GLUCOSE TEST) test strip TEST BLOOD SUGAR UP TO FOUR TIMES DAILY Dx E11.9 07/05/20   Dettinger, Fransisca Kaufmann, MD  pravastatin (PRAVACHOL) 40 MG tablet Take 1 tablet (40 mg total) by mouth daily. 09/18/20   Dettinger, Fransisca Kaufmann, MD  traZODone (DESYREL) 100 MG tablet Take 1 tablet (100 mg total) by mouth at bedtime as needed for sleep. 09/18/20   Dettinger, Fransisca Kaufmann, MD    Allergies    Patient has no known allergies.  Review of Systems   Review of Systems  Constitutional:  Negative for appetite change and fatigue.  HENT:  Negative for congestion, ear discharge and sinus pressure.        Patient with contusion  Eyes:  Negative for discharge.  Respiratory:  Negative for cough.   Cardiovascular:  Negative for chest pain.  Gastrointestinal:  Negative for abdominal pain and diarrhea.  Genitourinary:  Negative for frequency and hematuria.  Musculoskeletal:  Negative for back pain.  Skin:  Negative for rash.  Neurological:  Negative for seizures and headaches.  Psychiatric/Behavioral:  Negative for hallucinations.    Physical Exam Updated Vital Signs BP 108/73   Pulse 67   Temp 98.4 F (36.9 C) (Oral)   Resp 10   Ht $R'5\' 4"'jD$  (1.626 m)   Wt 80.7 kg   SpO2 95%   BMI 30.55 kg/m   Physical Exam Vitals reviewed.  Constitutional:      Appearance: She is well-developed.  HENT:     Head: Normocephalic.     Comments: Patient has contusions to head and face Eyes:     General: No scleral icterus.    Conjunctiva/sclera: Conjunctivae normal.  Neck:     Thyroid: No thyromegaly.  Cardiovascular:     Rate and Rhythm: Normal rate and regular rhythm.     Heart sounds: No murmur heard.   No friction rub. No gallop.  Pulmonary:     Breath sounds: No stridor. No wheezing or rales.  Chest:     Chest wall: No tenderness.  Abdominal:     General: There is no distension.     Tenderness: There is no abdominal tenderness. There is no  rebound.  Musculoskeletal:        General: Normal range of motion.     Cervical back: Neck supple.  Lymphadenopathy:     Cervical: No cervical adenopathy.  Skin:    Findings: No erythema or rash.  Neurological:     Mental Status: She is oriented to person, place, and time.     Motor: No abnormal muscle  tone.     Coordination: Coordination normal.  Psychiatric:        Behavior: Behavior normal.    ED Results / Procedures / Treatments   Labs (all labs ordered are listed, but only abnormal results are displayed) Labs Reviewed  CBC WITH DIFFERENTIAL/PLATELET - Abnormal; Notable for the following components:      Result Value   Platelets 121 (*)    All other components within normal limits  COMPREHENSIVE METABOLIC PANEL - Abnormal; Notable for the following components:   Potassium 3.2 (*)    CO2 38 (*)    Glucose, Bld 155 (*)    Calcium 8.4 (*)    Albumin 3.4 (*)    Anion gap 2 (*)    All other components within normal limits    EKG None  Radiology No results found.  Procedures Procedures   Medications Ordered in ED Medications - No data to display  ED Course  I have reviewed the triage vital signs and the nursing notes.  Pertinent labs & imaging results that were available during my care of the patient were reviewed by me and considered in my medical decision making (see chart for details).    MDM Rules/Calculators/A&P                           Contusion to face with small laceration nonsuturable.  CT scan head neck and face negative.  She will follow-up with her PCP Final Clinical Impression(s) / ED Diagnoses Final diagnoses:  Fall, initial encounter    Rx / DC Orders ED Discharge Orders     None        Milton Ferguson, MD 09/21/20 1237

## 2020-09-20 NOTE — Discharge Instructions (Addendum)
Clean your laceration twice a day with soap and water.  Use either a walker or a very steady cane to ambulate with.  Follow-up with your family doctor next week for recheck

## 2020-09-20 NOTE — ED Triage Notes (Addendum)
Pt here by [employer] after a fall, pt has lac to the bridge of her nose from hitting her face on the shower bench, bleeding controlled at this time, not on blood thinners, denies LOC

## 2020-09-20 NOTE — Telephone Encounter (Signed)
Pts grandson came in office requesting FL2 for pt. Says pt fell and was taken to the ER and was recommended to get FL2 for pt based on her health conditions.   Please call grandson when [facility] is ready.

## 2020-09-24 NOTE — Telephone Encounter (Signed)
Form printed for provider to sign

## 2020-09-27 NOTE — Telephone Encounter (Signed)
Aware form ready, but I will reach out to [facility]. Linda Fletcher does not know of the facility that they may place her at.

## 2020-10-09 ENCOUNTER — Other Ambulatory Visit: Payer: Self-pay | Admitting: Family Medicine

## 2020-10-11 NOTE — Telephone Encounter (Signed)
Talked with [name]. They do not have placement in mind at this point, also working out details regarding her income and all. FL2 faxed to her at [phone]

## 2020-10-22 ENCOUNTER — Ambulatory Visit (HOSPITAL_COMMUNITY)
Admission: RE | Admit: 2020-10-22 | Discharge: 2020-10-22 | Disposition: A | Discharge status: Home / Self Care | Payer: Medicare HMO | Source: Ambulatory Visit | Attending: Family Medicine | Admitting: Family Medicine

## 2020-10-22 ENCOUNTER — Other Ambulatory Visit: Payer: Self-pay

## 2020-10-22 ENCOUNTER — Encounter (HOSPITAL_COMMUNITY): Payer: Self-pay | Admitting: Radiology

## 2020-10-22 DIAGNOSIS — E049 Nontoxic goiter, unspecified: Secondary | ICD-10-CM | POA: Diagnosis not present

## 2020-10-22 DIAGNOSIS — R591 Generalized enlarged lymph nodes: Secondary | ICD-10-CM | POA: Insufficient documentation

## 2020-10-22 DIAGNOSIS — K111 Hypertrophy of salivary gland: Secondary | ICD-10-CM | POA: Diagnosis not present

## 2020-10-22 DIAGNOSIS — M47812 Spondylosis without myelopathy or radiculopathy, cervical region: Secondary | ICD-10-CM | POA: Diagnosis not present

## 2020-10-22 MED ORDER — IOHEXOL 300 MG/ML  SOLN
100.0000 mL | Freq: Once | INTRAMUSCULAR | Status: AC | PRN
  Administered 2020-10-22: 60 mL via INTRAVENOUS

## 2020-10-24 ENCOUNTER — Other Ambulatory Visit: Payer: Self-pay | Admitting: Family Medicine

## 2020-10-24 DIAGNOSIS — R221 Localized swelling, mass and lump, neck: Secondary | ICD-10-CM

## 2020-10-25 LAB — POCT I-STAT CREATININE: Creatinine, Ser: 1.2 mg/dL — ABNORMAL HIGH (ref 0.44–1.00)

## 2020-11-22 ENCOUNTER — Encounter: Payer: Self-pay | Admitting: Family Medicine

## 2020-11-22 ENCOUNTER — Other Ambulatory Visit: Payer: Self-pay

## 2020-11-22 ENCOUNTER — Ambulatory Visit (INDEPENDENT_AMBULATORY_CARE_PROVIDER_SITE_OTHER): Payer: Medicare HMO | Admitting: Family Medicine

## 2020-11-22 DIAGNOSIS — E1169 Type 2 diabetes mellitus with other specified complication: Secondary | ICD-10-CM

## 2020-11-22 DIAGNOSIS — I152 Hypertension secondary to endocrine disorders: Secondary | ICD-10-CM

## 2020-11-22 DIAGNOSIS — E785 Hyperlipidemia, unspecified: Secondary | ICD-10-CM

## 2020-11-22 DIAGNOSIS — E119 Type 2 diabetes mellitus without complications: Secondary | ICD-10-CM | POA: Diagnosis not present

## 2020-11-22 DIAGNOSIS — E1159 Type 2 diabetes mellitus with other circulatory complications: Secondary | ICD-10-CM

## 2020-11-22 DIAGNOSIS — G301 Alzheimer's disease with late onset: Secondary | ICD-10-CM

## 2020-11-22 DIAGNOSIS — K219 Gastro-esophageal reflux disease without esophagitis: Secondary | ICD-10-CM

## 2020-11-22 DIAGNOSIS — F02C3 Dementia in other diseases classified elsewhere, severe, with mood disturbance: Secondary | ICD-10-CM

## 2020-11-22 DIAGNOSIS — F339 Major depressive disorder, recurrent, unspecified: Secondary | ICD-10-CM

## 2020-11-22 LAB — BAYER DCA HB A1C WAIVED: HB A1C (BAYER DCA - WAIVED): 6 % — ABNORMAL HIGH (ref 4.8–5.6)

## 2020-11-22 NOTE — Progress Notes (Signed)
Patient left before could be seen by the provider

## 2020-11-25 ENCOUNTER — Ambulatory Visit (INDEPENDENT_AMBULATORY_CARE_PROVIDER_SITE_OTHER): Payer: Medicare HMO | Admitting: Family Medicine

## 2020-11-25 ENCOUNTER — Other Ambulatory Visit: Payer: Self-pay

## 2020-11-25 ENCOUNTER — Encounter: Payer: Self-pay | Admitting: Family Medicine

## 2020-11-25 VITALS — BP 145/79 | HR 63 | Ht 64.0 in | Wt 175.0 lb

## 2020-11-25 DIAGNOSIS — F02C3 Dementia in other diseases classified elsewhere, severe, with mood disturbance: Secondary | ICD-10-CM | POA: Diagnosis not present

## 2020-11-25 DIAGNOSIS — I152 Hypertension secondary to endocrine disorders: Secondary | ICD-10-CM | POA: Diagnosis not present

## 2020-11-25 DIAGNOSIS — E119 Type 2 diabetes mellitus without complications: Secondary | ICD-10-CM

## 2020-11-25 DIAGNOSIS — E785 Hyperlipidemia, unspecified: Secondary | ICD-10-CM

## 2020-11-25 DIAGNOSIS — E1159 Type 2 diabetes mellitus with other circulatory complications: Secondary | ICD-10-CM

## 2020-11-25 DIAGNOSIS — F339 Major depressive disorder, recurrent, unspecified: Secondary | ICD-10-CM | POA: Diagnosis not present

## 2020-11-25 DIAGNOSIS — G301 Alzheimer's disease with late onset: Secondary | ICD-10-CM

## 2020-11-25 DIAGNOSIS — D37030 Neoplasm of uncertain behavior of the parotid salivary glands: Secondary | ICD-10-CM | POA: Diagnosis not present

## 2020-11-25 DIAGNOSIS — E1169 Type 2 diabetes mellitus with other specified complication: Secondary | ICD-10-CM

## 2020-11-25 NOTE — Progress Notes (Signed)
BP (!) 145/79   Pulse 63   Ht $R'5\' 4"'Vi$  (1.626 m)   Wt 175 lb (79.4 kg)   SpO2 97%   BMI 30.04 kg/m    Subjective:   Patient ID: Linda Fletcher, female    DOB: [birth year], 80 y.o.   MRN: [id number]  HPI: Linda Fletcher is a 80 y.o. female presenting on 11/25/2020 for Medical Management of Chronic Issues, Diabetes, and Hypertension   HPI Type 2 diabetes mellitus Patient comes in today for recheck of his diabetes. Patient has been currently taking Januvia and glipizide and A1c is 6.06.. Patient is currently on an ACE inhibitor/ARB. Patient has not seen an ophthalmologist this year. Patient denies any issues with their feet. The symptom started onset as an adult hypertension and hyperlipidemia ARE RELATED TO DM   Hypertension Patient is currently on furosemide and losartan, and their blood pressure today is 145/79. Patient denies any lightheadedness or dizziness. Patient denies headaches, blurred vision, chest pains, shortness of breath, or weakness. Denies any side effects from medication and is content with current medication.   Hyperlipidemia Patient is coming in for recheck of his hyperlipidemia. The patient is currently taking pravastatin. They deny any issues with myalgias or history of liver damage from it. They deny any focal numbness or weakness or chest pain.   Dementia with depression and behavioral disturbance. Patient currently takes clovoxamine and trazodone and Namenda and per her son they are stable, no better no worse.  She is awake at night and sleeping during the day but she still gets around pretty good.  Relevant past medical, surgical, family and social history reviewed and updated as indicated. Interim medical history since our last visit reviewed. Allergies and medications reviewed and updated.  Review of Systems  Constitutional:  Negative for chills and fever.  Eyes:  Negative for visual disturbance.  Respiratory:  Negative for chest tightness and shortness of  breath.   Cardiovascular:  Negative for chest pain and leg swelling.  Musculoskeletal:  Negative for back pain and gait problem.  Skin:  Negative for rash.  Neurological:  Negative for light-headedness and headaches.  Psychiatric/Behavioral:  Positive for confusion and sleep disturbance. Negative for agitation and behavioral problems.   All other systems reviewed and are negative.  Per HPI unless specifically indicated above   Allergies as of 11/25/2020   No Known Allergies      Medication List        Accurate as of November 25, 2020  8:56 AM. If you have any questions, ask your nurse or doctor.          blood glucose meter kit and supplies Kit Dispense based on patient and insurance preference. Use up to four times daily as directed. (FOR ICD-9 250.00, 250.01).   calcium carbonate 600 MG Tabs tablet Commonly known as: OS-CAL Take 1 tablet (600 mg total) by mouth 2 (two) times daily with a meal.   donepezil 10 MG tablet Commonly known as: ARICEPT Take 1 tablet (10 mg total) by mouth at bedtime.   fluvoxaMINE 100 MG tablet Commonly known as: LUVOX Take 1 tablet (100 mg total) by mouth daily.   furosemide 40 MG tablet Commonly known as: LASIX Take 1 tablet (40 mg total) by mouth daily.   glipiZIDE 10 MG tablet Commonly known as: GLUCOTROL Take 1 tablet (10 mg total) by mouth 2 (two) times daily.   losartan 50 MG tablet Commonly known as: COZAAR Take 1 tablet (50 mg total) by mouth  daily.   memantine 14 MG Cp24 24 hr capsule Commonly known as: NAMENDA XR Take 1 capsule (14 mg total) by mouth daily.   omeprazole 40 MG capsule Commonly known as: PRILOSEC Take 1 capsule (40 mg total) by mouth daily.   potassium chloride SA 20 MEQ tablet Commonly known as: KLOR-CON TAKE 1 TABLET EVERY DAY   pravastatin 40 MG tablet Commonly known as: PRAVACHOL Take 1 tablet (40 mg total) by mouth daily.   PROBIOTIC DAILY PO Take 1 capsule by mouth daily.   sitaGLIPtin  100 MG tablet Commonly known as: Januvia Take 1 tablet (100 mg total) by mouth daily.   traZODone 100 MG tablet Commonly known as: DESYREL Take 1 tablet (100 mg total) by mouth at bedtime as needed for sleep.   True Metrix Blood Glucose Test test strip Generic drug: glucose blood TEST BLOOD SUGAR UP TO FOUR TIMES DAILY Dx E11.9         Objective:   BP (!) 145/79   Pulse 63   Ht $R'5\' 4"'CM$  (1.626 m)   Wt 175 lb (79.4 kg)   SpO2 97%   BMI 30.04 kg/m   Wt Readings from Last 3 Encounters:  11/25/20 175 lb (79.4 kg)  11/22/20 175 lb (79.4 kg)  09/20/20 178 lb (80.7 kg)    Physical Exam Vitals and nursing note reviewed.  Constitutional:      General: She is not in acute distress.    Appearance: She is well-developed. She is not diaphoretic.  Eyes:     Conjunctiva/sclera: Conjunctivae normal.  Cardiovascular:     Rate and Rhythm: Normal rate and regular rhythm.     Heart sounds: Normal heart sounds. No murmur heard. Pulmonary:     Effort: Pulmonary effort is normal. No respiratory distress.     Breath sounds: Normal breath sounds. No wheezing.  Musculoskeletal:        General: No tenderness. Normal range of motion.  Skin:    General: Skin is warm and dry.     Findings: No rash.  Neurological:     Mental Status: She is alert and oriented to person, place, and time.     Coordination: Coordination normal.  Psychiatric:        Behavior: Behavior normal.        Thought Content: Thought content does not include suicidal ideation. Thought content does not include suicidal plan.        Cognition and Memory: Memory is impaired. She exhibits impaired recent memory.      Assessment & Plan:   Problem List Items Addressed This Visit       Cardiovascular and Mediastinum   Hypertension associated with diabetes ([place])     Endocrine   Diabetes mellitus without complication ([place]) - Primary   Hyperlipidemia associated with type 2 diabetes mellitus ([place])     Nervous and  Auditory   Dementia ([place])     Other   Depression, recurrent (HCC)    A1c was 6.0, looks good.  Allowing permissive hypertension.  No change in medication.  It does seem like her dementia has worsened some. Follow up plan: Return in about 3 months (around 02/25/2021), or if symptoms worsen or fail to improve, for Diabetes and hypertension and cholesterol.  Counseling provided for all of the vaccine components No orders of the defined types were placed in this encounter.   Caryl Pina, MD [facility] 11/25/2020, 8:56 AM

## 2020-11-26 ENCOUNTER — Other Ambulatory Visit (HOSPITAL_COMMUNITY): Payer: Self-pay | Admitting: Otolaryngology

## 2020-11-26 ENCOUNTER — Other Ambulatory Visit: Payer: Self-pay | Admitting: Otolaryngology

## 2020-11-26 DIAGNOSIS — K118 Other diseases of salivary glands: Secondary | ICD-10-CM

## 2020-11-28 ENCOUNTER — Other Ambulatory Visit: Payer: Self-pay | Admitting: Family Medicine

## 2020-12-06 ENCOUNTER — Ambulatory Visit (HOSPITAL_COMMUNITY)
Admission: RE | Admit: 2020-12-06 | Discharge: 2020-12-06 | Disposition: A | Discharge status: Home / Self Care | Payer: Medicare HMO | Source: Ambulatory Visit | Attending: Otolaryngology | Admitting: Otolaryngology

## 2020-12-06 ENCOUNTER — Other Ambulatory Visit: Payer: Self-pay

## 2020-12-06 DIAGNOSIS — K118 Other diseases of salivary glands: Secondary | ICD-10-CM | POA: Diagnosis not present

## 2020-12-06 DIAGNOSIS — R221 Localized swelling, mass and lump, neck: Secondary | ICD-10-CM | POA: Diagnosis not present

## 2020-12-10 ENCOUNTER — Encounter (HOSPITAL_COMMUNITY): Payer: Self-pay | Admitting: Radiology

## 2020-12-10 NOTE — Progress Notes (Signed)
Patient Name  Tashianna, Broome Legal Sex  Female DOB  April 10, 1940 SSN  [id number] Address  [place] [address] Phone  [phone] (Home)    RE: [place] FNA SALIVARY GLAND/PAROTID GLAND Received: Today Arne Cleveland, MD  Arlyn Leak   [place] FNA L parotid mass   DDH        Previous Messages   ----- Message -----  From: Garth Bigness D  Sent: 12/09/2020  12:51 PM EST  To: [facility] Procedure Requests  Subject: [place] FNA SALIVARY GLAND/PAROTID GLAND             Procedure:  [place] FNA SALIVARY GLAND/PAROTID GLAND   Reason:  Parotid mass   History:  CT, [place] in computer   Provider:  Leta Baptist   Provider Contact:  [phone]

## 2020-12-14 ENCOUNTER — Other Ambulatory Visit: Payer: Self-pay | Admitting: Family Medicine

## 2020-12-17 ENCOUNTER — Other Ambulatory Visit: Payer: Self-pay | Admitting: Radiology

## 2020-12-19 ENCOUNTER — Ambulatory Visit (HOSPITAL_COMMUNITY)
Admission: RE | Admit: 2020-12-19 | Discharge: 2020-12-19 | Disposition: A | Discharge status: Home / Self Care | Payer: Medicare HMO | Source: Ambulatory Visit | Attending: Otolaryngology | Admitting: Otolaryngology

## 2020-12-19 ENCOUNTER — Other Ambulatory Visit: Payer: Self-pay

## 2020-12-19 ENCOUNTER — Encounter (HOSPITAL_COMMUNITY): Payer: Self-pay

## 2020-12-19 DIAGNOSIS — Z6829 Body mass index (BMI) 29.0-29.9, adult: Secondary | ICD-10-CM | POA: Insufficient documentation

## 2020-12-19 DIAGNOSIS — C07 Malignant neoplasm of parotid gland: Secondary | ICD-10-CM | POA: Insufficient documentation

## 2020-12-19 DIAGNOSIS — I10 Essential (primary) hypertension: Secondary | ICD-10-CM | POA: Insufficient documentation

## 2020-12-19 DIAGNOSIS — E669 Obesity, unspecified: Secondary | ICD-10-CM | POA: Insufficient documentation

## 2020-12-19 DIAGNOSIS — K219 Gastro-esophageal reflux disease without esophagitis: Secondary | ICD-10-CM | POA: Diagnosis not present

## 2020-12-19 DIAGNOSIS — K118 Other diseases of salivary glands: Secondary | ICD-10-CM | POA: Diagnosis not present

## 2020-12-19 DIAGNOSIS — F0393 Unspecified dementia, unspecified severity, with mood disturbance: Secondary | ICD-10-CM | POA: Diagnosis not present

## 2020-12-19 DIAGNOSIS — C800 Disseminated malignant neoplasm, unspecified: Secondary | ICD-10-CM | POA: Diagnosis not present

## 2020-12-19 DIAGNOSIS — R221 Localized swelling, mass and lump, neck: Secondary | ICD-10-CM | POA: Diagnosis not present

## 2020-12-19 DIAGNOSIS — E119 Type 2 diabetes mellitus without complications: Secondary | ICD-10-CM | POA: Insufficient documentation

## 2020-12-19 DIAGNOSIS — E785 Hyperlipidemia, unspecified: Secondary | ICD-10-CM | POA: Insufficient documentation

## 2020-12-19 LAB — GLUCOSE, CAPILLARY: Glucose-Capillary: 136 mg/dL — ABNORMAL HIGH (ref 70–99)

## 2020-12-19 MED ORDER — LIDOCAINE HCL (PF) 1 % IJ SOLN
INTRAMUSCULAR | Status: AC
  Filled 2020-12-19: qty 30

## 2020-12-19 MED ORDER — SODIUM CHLORIDE 0.9 % IV SOLN: INTRAVENOUS | Status: DC

## 2020-12-19 NOTE — Consult Note (Addendum)
Chief Complaint: Patient was seen in consultation today for  image guided left parotid mass biopsy  Referring Physician(s): Teoh,Su  Supervising Physician: Rolla Plate  Patient Status: [facility] - Out-pt  History of Present Illness: Linda Fletcher is an 80 y.o. female, former smoker,  with PMH arthritis, dementia, depression, DM, GERD, HLD, HTN, obesity, multinodular thyroid gland and recent imaging revealing an ill-defined masslike soft tissue thickening in the left parotid space measuring up to 3.6 cm, mildly enlarged compared to CT of the cervical spine performed September 2020 along with RUL pulmonary nodule. She presents today for image guided left parotid mass bx for further evaluation.   Past Medical History:  Diagnosis Date   Arthritis    Dementia ([place])    Depression    Diabetes mellitus without complication (HCC)    GERD (gastroesophageal reflux disease)    Glaucoma    Hx of adenomatous colonic polyps    Hyperlipidemia    Hypertension    Obesity     Past Surgical History:  Procedure Laterality Date   ABDOMINAL HYSTERECTOMY     CHOLECYSTECTOMY     THYROID CYST EXCISION     TONSILLECTOMY AND ADENOIDECTOMY     age 73    Allergies: Patient has no known allergies.  Medications: Prior to Admission medications   Medication Sig Start Date End Date Taking? Authorizing Provider  calcium carbonate (OS-CAL) 600 MG TABS tablet Take 1 tablet (600 mg total) by mouth 2 (two) times daily with a meal. 12/17/17  Yes Dettinger, Fransisca Kaufmann, MD  donepezil (ARICEPT) 10 MG tablet Take 1 tablet (10 mg total) by mouth at bedtime. 09/18/20  Yes Dettinger, Fransisca Kaufmann, MD  fluvoxaMINE (LUVOX) 100 MG tablet TAKE 1 TABLET EVERY DAY 11/29/20  Yes Dettinger, Fransisca Kaufmann, MD  furosemide (LASIX) 40 MG tablet Take 1 tablet (40 mg total) by mouth daily. 09/18/20  Yes Dettinger, Fransisca Kaufmann, MD  glipiZIDE (GLUCOTROL) 10 MG tablet Take 1 tablet (10 mg total) by mouth 2 (two) times daily. 09/18/20  Yes Dettinger, Fransisca Kaufmann, MD  losartan (COZAAR) 50 MG tablet Take 1 tablet (50 mg total) by mouth daily. 09/18/20  Yes Dettinger, Fransisca Kaufmann, MD  memantine (NAMENDA XR) 14 MG CP24 24 hr capsule TAKE 1 CAPSULE ($RemoveBe'14MG'lJHGgJGJK$  TOTAL) DAILY 11/29/20  Yes Dettinger, Fransisca Kaufmann, MD  Multiple Vitamins-Minerals (MULTIVITAMIN WITH MINERALS) tablet Take 1 tablet by mouth daily.   Yes [provider]  omeprazole (PRILOSEC) 40 MG capsule Take 1 capsule (40 mg total) by mouth daily. 09/18/20  Yes Dettinger, Fransisca Kaufmann, MD  potassium chloride SA (KLOR-CON) 20 MEQ tablet TAKE 1 TABLET EVERY DAY 10/10/20  Yes Dettinger, Fransisca Kaufmann, MD  pravastatin (PRAVACHOL) 40 MG tablet TAKE 1 TABLET EVERY DAY 11/29/20  Yes Dettinger, Fransisca Kaufmann, MD  Probiotic Product (PROBIOTIC DAILY PO) Take 1 capsule by mouth daily.    Yes [provider]  sitaGLIPtin (JANUVIA) 100 MG tablet Take 1 tablet (100 mg total) by mouth daily. 09/18/20  Yes Dettinger, Fransisca Kaufmann, MD  traZODone (DESYREL) 100 MG tablet Take 1 tablet (100 mg total) by mouth at bedtime as needed for sleep. 09/18/20  Yes Dettinger, Fransisca Kaufmann, MD  blood glucose meter kit and supplies KIT Dispense based on patient and insurance preference. Use up to four times daily as directed. (FOR ICD-9 250.00, 250.01). 06/08/19   Dettinger, Fransisca Kaufmann, MD  glucose blood (TRUE METRIX BLOOD GLUCOSE TEST) test strip TEST BLOOD SUGAR UP TO FOUR TIMES DAILY Dx E11.9 07/05/20  Dettinger, Fransisca Kaufmann, MD     Family History  Problem Relation Age of Onset   Arthritis Mother    Diabetes Mother    Diabetes Son    Hypertension Son    Hyperlipidemia Son    Hypertension Daughter     Social History   Socioeconomic History   Marital status: Widowed    Spouse name: Not on file   Number of children: 3   Years of education: Not on file   Highest education level: 3rd grade  Occupational History   Occupation: Retired     Comment: Regulatory affairs officer  Tobacco Use   Smoking status: Former    Types: Cigarettes    Quit date: 07/18/1974     Years since quitting: 46.4   Smokeless tobacco: Never  Vaping Use   Vaping Use: Never used  Substance and Sexual Activity   Alcohol use: No   Drug use: No   Sexual activity: Not Currently  Other Topics Concern   Not on file  Social History Narrative   Lives with son and daughter in law. Cannot read very well due to 3rd grade education. Son helps with reading when needed.   Social Determinants of Health   Financial Resource Strain: Not on file  Food Insecurity: Not on file  Transportation Needs: Not on file  Physical Activity: Not on file  Stress: Not on file  Social Connections: Not on file      Review of Systems denies fever,HA,CP,dyspnea, cough, abd pain, back pain,N/V or bleeding  Vital Signs: BP 137/73   Pulse 66   Temp 98 F (36.7 C) (Oral)   Resp 16   Ht 5\' 4"  (1.626 m)   Wt 174 lb (78.9 kg)   SpO2 97%   BMI 29.87 kg/m   Physical Exam awake, answers simple questions ok, chest- CTA bilat; heart- RRR; abd- soft,+BS,NT; ext with FROM; noted left parotid mass  Imaging: US SOFT TISSUE HEAD & NECK (NON-THYROID)  Result Date: 12/06/2020 CLINICAL DATA:  Left-sided parotid mass seen on neck CT performed 10/22/2020. Palpable abnormality involving the left side of the neck. EXAM: ULTRASOUND OF HEAD/NECK SOFT TISSUES TECHNIQUE: Ultrasound examination of the head and neck soft tissues was performed in the area of clinical concern. COMPARISON:  Neck CT 10/22/2020 FINDINGS: Sonographic evaluation of the patient's palpable area of concern involving the left parotid gland correlates with an ill-defined at least 2.5 x 2.2 x 1.1 cm hypoechoic mass which correlates with the ill-defined aggressive mass within the left parotid space on preceding neck CT performed 10/22/2020. IMPRESSION: Sonographic evaluation of patient's palpable area of concern involving the left parotid gland correlates with an approximately 2.5 cm hypoechoic mass, correlating with the aggressive mass within the left  parotid space on preceding neck CT performed 10/22/2020. If definitive surgical resection is not pursued, this mass could undergo ultrasound-guided biopsy as indicated. Electronically Signed   By: Sandi Mariscal M.D.   On: 12/06/2020 12:54    Labs:  CBC: Recent Labs    01/22/20 0851 07/18/20 2011 09/18/20 1413 09/20/20 1222  WBC 8.0 8.6 5.4 4.9  HGB 14.1 15.2* 14.7 13.6  HCT 42.0 44.3 43.0 41.1  PLT 183 201 133* 121*    COAGS: No results for input(s): INR, APTT in the last 8760 hours.  BMP: Recent Labs    01/22/20 0851 07/18/20 2011 09/18/20 1413 09/20/20 1222 10/22/20 1008  NA 144 137 139 138  --   K 3.3* 4.0 4.1 3.2*  --  CL 95* 99 95* 98  --   CO2 $Re'29 28 27 'PPj$ 38*  --   GLUCOSE 153* 192* 137* 155*  --   BUN $Re'18 20 23 18  'dhS$ --   CALCIUM 9.5 9.5 10.0 8.4*  --   CREATININE 1.07* 1.08* 1.35* 0.93 1.20*  GFRNONAA 49* 52*  --  >60  --   GFRAA 57*  --   --   --   --     LIVER FUNCTION TESTS: Recent Labs    01/22/20 0851 07/18/20 2011 09/18/20 1413 09/20/20 1222  BILITOT 0.3 0.5 0.2 0.4  AST 27 33 26 34  ALT 30 32 15 21  ALKPHOS 77 78 64 55  PROT 6.7 7.8 6.8 6.9  ALBUMIN 4.2 4.1 4.1 3.4*    TUMOR MARKERS: No results for input(s): AFPTM, CEA, CA199, CHROMGRNA in the last 8760 hours.  Assessment and Plan: 80 y.o. female, former smoker,  with PMH arthritis, dementia, depression, DM, GERD, HLD, HTN, obesity, multinodular thyroid gland and recent imaging revealing an ill-defined masslike soft tissue thickening in the left parotid space measuring up to 3.6 cm, mildly enlarged compared to CT of the cervical spine performed September 2020 along with RUL pulmonary nodule. She presents today for image guided left parotid mass bx for further evaluation.Risks and benefits of procedure was discussed with the patient and/or patient's family including, but not limited to bleeding, infection, damage to adjacent structures or low yield requiring additional tests.  All of the questions  were answered and there is agreement to proceed.  Consent signed and in chart.    Thank you for this interesting consult.  I greatly enjoyed meeting Linda Fletcher and look forward to participating in their care.  A copy of this report was sent to the requesting provider on this date.  Electronically Signed: D. Rowe Robert, PA-C 12/19/2020, 7:30 AM   I spent a total of 25 minutes  in face to face in clinical consultation, greater than 50% of which was counseling/coordinating care for image guided left parotid mass biopsy

## 2020-12-19 NOTE — Procedures (Signed)
Interventional Radiology Procedure Note  Procedure: US guided FNA of left neck mass, in parotid surgical bed.  4 x 25g FNA.    Complications: None EBL: None Recommendations: - DC now.   - Routine wound care - Follow up pathology    Signed,  Corrie Mckusick, DO

## 2020-12-20 LAB — CYTOLOGY - NON PAP

## 2020-12-23 ENCOUNTER — Ambulatory Visit: Payer: Medicare HMO | Admitting: *Deleted

## 2020-12-23 DIAGNOSIS — F339 Major depressive disorder, recurrent, unspecified: Secondary | ICD-10-CM

## 2020-12-23 DIAGNOSIS — F02C3 Dementia in other diseases classified elsewhere, severe, with mood disturbance: Secondary | ICD-10-CM

## 2020-12-23 DIAGNOSIS — R221 Localized swelling, mass and lump, neck: Secondary | ICD-10-CM

## 2020-12-23 DIAGNOSIS — E119 Type 2 diabetes mellitus without complications: Secondary | ICD-10-CM

## 2020-12-23 DIAGNOSIS — G301 Alzheimer's disease with late onset: Secondary | ICD-10-CM

## 2020-12-23 NOTE — Patient Instructions (Signed)
Visit Information  Patient Goals/Self-Care Activities: Take medications as prescribed   Call provider office for new concerns or questions  Follow-up with Dr Benjamine Mola regarding biopsy results Call [place] as needed [phone] Check blood sugar daily and as needed and call PCP with any readings outside of recommended range   Patient verbalizes understanding of instructions provided today and agrees to view in [place].   Plan:Telephone follow up appointment with care management team member scheduled for:  01/20/2021 with RNCM The patient has been provided with contact information for the care management team and has been advised to call with any health related questions or concerns.   [name], BSN, RN-BC Embedded Chronic Care Manager [facility] / [place] Management Direct Dial: [phone]

## 2020-12-23 NOTE — Chronic Care Management (AMB) (Signed)
Chronic Care Management   CCM RN Visit Note  12/23/2020 Name: Linda Fletcher MRN: [id number] DOB: [birth year]  Subjective: Linda Fletcher is a 80 y.o. year old female who is a primary care patient of Dettinger, Fransisca Kaufmann, MD. The care management team was consulted for assistance with disease management and care coordination needs.    Engaged with patient by telephone for follow up visit in response to provider referral for case management and/or care coordination services.   Consent to Services:  The patient was given information about Chronic Care Management services, agreed to services, and gave verbal consent prior to initiation of services.  Please see initial visit note for detailed documentation.   Patient agreed to services and verbal consent obtained.   Assessment: Review of patient past medical history, allergies, medications, health status, including review of consultants reports, laboratory and other test data, was performed as part of comprehensive evaluation and provision of chronic care management services.   SDOH (Social Determinants of Health) assessments and interventions performed:    CCM Care Plan  No Known Allergies  Outpatient Encounter Medications as of 12/23/2020  Medication Sig   blood glucose meter kit and supplies KIT Dispense based on patient and insurance preference. Use up to four times daily as directed. (FOR ICD-9 250.00, 250.01).   calcium carbonate (OS-CAL) 600 MG TABS tablet Take 1 tablet (600 mg total) by mouth 2 (two) times daily with a meal.   donepezil (ARICEPT) 10 MG tablet Take 1 tablet (10 mg total) by mouth at bedtime.   fluvoxaMINE (LUVOX) 100 MG tablet TAKE 1 TABLET EVERY DAY   furosemide (LASIX) 40 MG tablet Take 1 tablet (40 mg total) by mouth daily.   glipiZIDE (GLUCOTROL) 10 MG tablet Take 1 tablet (10 mg total) by mouth 2 (two) times daily.   glucose blood (TRUE METRIX BLOOD GLUCOSE TEST) test strip TEST BLOOD SUGAR UP TO FOUR TIMES DAILY  Dx E11.9   losartan (COZAAR) 50 MG tablet Take 1 tablet (50 mg total) by mouth daily.   memantine (NAMENDA XR) 14 MG CP24 24 hr capsule TAKE 1 CAPSULE (14MG TOTAL) DAILY   Multiple Vitamins-Minerals (MULTIVITAMIN WITH MINERALS) tablet Take 1 tablet by mouth daily.   omeprazole (PRILOSEC) 40 MG capsule Take 1 capsule (40 mg total) by mouth daily.   potassium chloride SA (KLOR-CON) 20 MEQ tablet TAKE 1 TABLET EVERY DAY   pravastatin (PRAVACHOL) 40 MG tablet TAKE 1 TABLET EVERY DAY   Probiotic Product (PROBIOTIC DAILY PO) Take 1 capsule by mouth daily.    sitaGLIPtin (JANUVIA) 100 MG tablet Take 1 tablet (100 mg total) by mouth daily.   traZODone (DESYREL) 100 MG tablet Take 1 tablet (100 mg total) by mouth at bedtime as needed for sleep.   No facility-administered encounter medications on file as of 12/23/2020.    Patient Active Problem List   Diagnosis Date Noted   Suicidal ideation    Thyroid nodule 10/03/2018   Anemia 10/03/2018   GERD (gastroesophageal reflux disease) 09/26/2015   Hypertension associated with diabetes ([place]) 09/26/2015   Depression, recurrent ([place]) 09/26/2015   Osteoarthritis, shoulder 06/07/2015   Hyperlipidemia associated with type 2 diabetes mellitus ([place]) 09/25/2014   Dementia ([place])    Diabetes mellitus without complication ([place])     Conditions to be addressed/monitored:DMII, Depression, Dementia, and parotid mass  Care Plan : [place]  Updates made by Linda China, RN since 12/23/2020 12:00 AM     Problem: Chronic Disease Management Needs  Priority: High  Onset Date: 12/23/2020     Goal: Work with RN Care Manager Regarding Care Management and [place] with diabetes, dementia, HTN, depression, left parotid mass   Start Date: 12/23/2020  Expected End Date: 12/23/2021  This Visit's Progress: On track  Priority: High  Note:   Current Barriers:  Chronic Disease Management support and education needs related to HTN, DMII,  and dementia, depression, left parotid mass Cognitive Deficits  RNCM Clinical Goal(s):  Patient will continue to work with RN Care Manager and/or Social Worker to address care management and care coordination needs related to HTN, DMII, Depression, Dementia, and left parotid mass as evidenced by adherence to CM Team Scheduled appointments     through collaboration with RN Care manager, provider, and care team.   Interventions: 1:1 collaboration with primary care provider regarding development and update of comprehensive plan of care as evidenced by provider attestation and co-signature Inter-disciplinary care team collaboration (see longitudinal plan of care) Evaluation of current treatment plan related to  self management and patient's adherence to plan as established by provider   Diabetes:  (Status: New goal.) Long Term Goal   Lab Results  Component Value Date   HGBA1C 6.0 (H) 11/22/2020  Assessed patient's understanding of A1c goal: <8% Provided education to patient about basic DM disease process; Reviewed medications with patient and discussed importance of medication adherence;        Counseled on importance of regular laboratory monitoring as prescribed;        Advised patient, providing education and rationale, to check cbg once daily and when you have symptoms of low or high blood sugar and record        call provider for findings outside established parameters;       Review of patient status, including review of consultants reports, relevant laboratory and other test results, and medications completed;       Assessed social determinant of health barriers;         Parotid Mass:  (Status: New goal.) Long Term Goal  Evaluation of current treatment plan related to left parotid mass Chart reviewed including past medical history, consultation reports, imaging reports, and biopsy results Assessed family/social support Encouraged patient to folllow-up with ENT, Dr Benjamine Mola regarding  results of biopsy on 12/19/20  Patient Goals/Self-Care Activities: Take medications as prescribed   Call provider office for new concerns or questions  Follow-up with Dr Benjamine Mola regarding biopsy results Call RN Care Manager as needed [phone] Check blood sugar daily and as needed and call PCP with any readings outside of recommended range  Plan:Telephone follow up appointment with care management team member scheduled for:  01/20/2021 with [facility] The patient has been provided with contact information for the care management team and has been advised to call with any health related questions or concerns.   [name], BSN, RN-BC Embedded Chronic Care Manager [facility] / [place] [facility] Direct Dial: [phone]

## 2020-12-27 DIAGNOSIS — D37030 Neoplasm of uncertain behavior of the parotid salivary glands: Secondary | ICD-10-CM | POA: Diagnosis not present

## 2020-12-30 ENCOUNTER — Other Ambulatory Visit: Payer: Self-pay | Admitting: Family Medicine

## 2021-01-15 DIAGNOSIS — Z87891 Personal history of nicotine dependence: Secondary | ICD-10-CM | POA: Diagnosis not present

## 2021-01-15 DIAGNOSIS — C07 Malignant neoplasm of parotid gland: Secondary | ICD-10-CM | POA: Diagnosis not present

## 2021-01-20 ENCOUNTER — Telehealth: Payer: Medicare HMO

## 2021-02-06 DIAGNOSIS — E042 Nontoxic multinodular goiter: Secondary | ICD-10-CM | POA: Diagnosis not present

## 2021-02-06 DIAGNOSIS — G518 Other disorders of facial nerve: Secondary | ICD-10-CM | POA: Diagnosis not present

## 2021-02-06 DIAGNOSIS — R918 Other nonspecific abnormal finding of lung field: Secondary | ICD-10-CM | POA: Diagnosis not present

## 2021-02-06 DIAGNOSIS — C07 Malignant neoplasm of parotid gland: Secondary | ICD-10-CM | POA: Diagnosis not present

## 2021-02-06 DIAGNOSIS — D3502 Benign neoplasm of left adrenal gland: Secondary | ICD-10-CM | POA: Diagnosis not present

## 2021-02-07 DIAGNOSIS — E119 Type 2 diabetes mellitus without complications: Secondary | ICD-10-CM | POA: Diagnosis not present

## 2021-02-07 DIAGNOSIS — I252 Old myocardial infarction: Secondary | ICD-10-CM | POA: Diagnosis not present

## 2021-02-07 DIAGNOSIS — I444 Left anterior fascicular block: Secondary | ICD-10-CM | POA: Diagnosis not present

## 2021-02-07 DIAGNOSIS — Z87891 Personal history of nicotine dependence: Secondary | ICD-10-CM | POA: Diagnosis not present

## 2021-02-07 DIAGNOSIS — C07 Malignant neoplasm of parotid gland: Secondary | ICD-10-CM | POA: Diagnosis not present

## 2021-02-08 DIAGNOSIS — I444 Left anterior fascicular block: Secondary | ICD-10-CM | POA: Diagnosis not present

## 2021-02-09 ENCOUNTER — Other Ambulatory Visit: Payer: Self-pay | Admitting: Family Medicine

## 2021-02-09 DIAGNOSIS — E119 Type 2 diabetes mellitus without complications: Secondary | ICD-10-CM

## 2021-02-09 DIAGNOSIS — F339 Major depressive disorder, recurrent, unspecified: Secondary | ICD-10-CM

## 2021-02-09 DIAGNOSIS — K219 Gastro-esophageal reflux disease without esophagitis: Secondary | ICD-10-CM

## 2021-02-11 ENCOUNTER — Encounter: Payer: Self-pay | Admitting: Family Medicine

## 2021-02-11 NOTE — Telephone Encounter (Signed)
Dettinger. NTBS for 3 mos ckup around 2/14. Mail order sent

## 2021-02-11 NOTE — Telephone Encounter (Signed)
Lmtcb to schedule an appt on Feb 25 2021 or around that date.

## 2021-02-12 ENCOUNTER — Encounter: Payer: Self-pay | Admitting: Family Medicine

## 2021-02-12 ENCOUNTER — Ambulatory Visit (INDEPENDENT_AMBULATORY_CARE_PROVIDER_SITE_OTHER): Payer: Medicare HMO | Admitting: Family Medicine

## 2021-02-12 VITALS — BP 136/77 | HR 72 | Ht 64.0 in | Wt 177.0 lb

## 2021-02-12 DIAGNOSIS — F02C3 Dementia in other diseases classified elsewhere, severe, with mood disturbance: Secondary | ICD-10-CM

## 2021-02-12 DIAGNOSIS — K219 Gastro-esophageal reflux disease without esophagitis: Secondary | ICD-10-CM | POA: Diagnosis not present

## 2021-02-12 DIAGNOSIS — G301 Alzheimer's disease with late onset: Secondary | ICD-10-CM | POA: Diagnosis not present

## 2021-02-12 DIAGNOSIS — E785 Hyperlipidemia, unspecified: Secondary | ICD-10-CM

## 2021-02-12 DIAGNOSIS — F339 Major depressive disorder, recurrent, unspecified: Secondary | ICD-10-CM

## 2021-02-12 DIAGNOSIS — E1159 Type 2 diabetes mellitus with other circulatory complications: Secondary | ICD-10-CM | POA: Diagnosis not present

## 2021-02-12 DIAGNOSIS — E1169 Type 2 diabetes mellitus with other specified complication: Secondary | ICD-10-CM

## 2021-02-12 DIAGNOSIS — E119 Type 2 diabetes mellitus without complications: Secondary | ICD-10-CM | POA: Diagnosis not present

## 2021-02-12 DIAGNOSIS — I152 Hypertension secondary to endocrine disorders: Secondary | ICD-10-CM | POA: Diagnosis not present

## 2021-02-12 MED ORDER — MEMANTINE HCL ER 14 MG PO CP24: 14.0000 mg | ORAL_CAPSULE | Freq: Every day | ORAL | 3 refills | Status: AC

## 2021-02-12 MED ORDER — TRAZODONE HCL 100 MG PO TABS: 100.0000 mg | ORAL_TABLET | Freq: Every evening | ORAL | 3 refills | Status: AC | PRN

## 2021-02-12 MED ORDER — PRAVASTATIN SODIUM 40 MG PO TABS: 40.0000 mg | ORAL_TABLET | Freq: Every day | ORAL | 3 refills | Status: AC

## 2021-02-12 MED ORDER — POTASSIUM CHLORIDE CRYS ER 20 MEQ PO TBCR: 20.0000 meq | EXTENDED_RELEASE_TABLET | Freq: Every day | ORAL | 3 refills | Status: AC

## 2021-02-12 MED ORDER — FUROSEMIDE 40 MG PO TABS: 40.0000 mg | ORAL_TABLET | Freq: Every day | ORAL | 3 refills | Status: AC

## 2021-02-12 MED ORDER — SITAGLIPTIN PHOSPHATE 100 MG PO TABS: 100.0000 mg | ORAL_TABLET | Freq: Every day | ORAL | 3 refills | Status: AC

## 2021-02-12 MED ORDER — GLIPIZIDE 10 MG PO TABS: 10.0000 mg | ORAL_TABLET | Freq: Two times a day (BID) | ORAL | 3 refills | Status: AC

## 2021-02-12 MED ORDER — FLUVOXAMINE MALEATE 100 MG PO TABS: 100.0000 mg | ORAL_TABLET | Freq: Every day | ORAL | 3 refills | Status: AC

## 2021-02-12 MED ORDER — LOSARTAN POTASSIUM 50 MG PO TABS: 50.0000 mg | ORAL_TABLET | Freq: Every day | ORAL | 3 refills | Status: AC

## 2021-02-12 MED ORDER — OMEPRAZOLE 40 MG PO CPDR: 40.0000 mg | DELAYED_RELEASE_CAPSULE | Freq: Every day | ORAL | 3 refills | Status: AC

## 2021-02-12 MED ORDER — DONEPEZIL HCL 10 MG PO TABS: 10.0000 mg | ORAL_TABLET | Freq: Every day | ORAL | 3 refills | Status: AC

## 2021-02-12 NOTE — Progress Notes (Signed)
BP 136/77    Pulse 72    Ht 5\' 4"  (1.626 m)    Wt 177 lb (80.3 kg)    SpO2 98%    BMI 30.38 kg/m    Subjective:   Patient ID: Linda Fletcher, female    DOB: 1940-06-10, 81 y.o.   MRN: [id number]  HPI: Linda Fletcher is a 81 y.o. female presenting on 02/12/2021 for Medical Management of Chronic Issues and Medication refills   HPI Dementia with behavioral disturbance Patient is coming in for recheck for dementia with behavioral disturbance.  She currently takes Luvox and trazodone and Namenda and Aricept.  Her son is here with her and provides most the history and says she has been stable although she did go through a phase where she was throwing her medicines away rather than taking them and that did regress her a little bit but he thinks it is better now that she is back on them but that she has been recently.  Type 2 diabetes mellitus Patient comes in today for recheck of his diabetes. Patient has been currently taking glipizide and Januvia. Patient is currently on an ACE inhibitor/ARB. Patient has not seen an ophthalmologist this year. Patient denies any issues with their feet. The symptom started onset as an adult hypertension and hyperlipidemia ARE RELATED TO DM   Hyperlipidemia Patient is coming in for recheck of his hyperlipidemia. The patient is currently taking pravastatin. They deny any issues with myalgias or history of liver damage from it. They deny any focal numbness or weakness or chest pain.   Hypertension Patient is currently on losartan and furosemide, and their blood pressure today is 136/77. Patient denies any lightheadedness or dizziness. Patient denies headaches, blurred vision, chest pains, shortness of breath, or weakness. Denies any side effects from medication and is content with current medication.   Relevant past medical, surgical, family and social history reviewed and updated as indicated. Interim medical history since our last visit reviewed. Allergies and  medications reviewed and updated.  Review of Systems  Constitutional:  Negative for chills and fever.  Eyes:  Negative for visual disturbance.  Respiratory:  Negative for chest tightness and shortness of breath.   Cardiovascular:  Negative for chest pain and leg swelling.  Musculoskeletal:  Negative for back pain and gait problem.  Skin:  Negative for rash.  Neurological:  Negative for light-headedness and headaches.  Psychiatric/Behavioral:  Positive for behavioral problems, confusion, decreased concentration and dysphoric mood. Negative for agitation, self-injury, sleep disturbance and suicidal ideas. The patient is nervous/anxious.   All other systems reviewed and are negative.  Per HPI unless specifically indicated above   Allergies as of 02/12/2021   No Known Allergies      Medication List        Accurate as of February 12, 2021 11:27 AM. If you have any questions, ask your nurse or doctor.          blood glucose meter kit and supplies Kit Dispense based on patient and insurance preference. Use up to four times daily as directed. (FOR ICD-9 250.00, 250.01).   calcium carbonate 600 MG Tabs tablet Commonly known as: OS-CAL Take 1 tablet (600 mg total) by mouth 2 (two) times daily with a meal.   donepezil 10 MG tablet Commonly known as: ARICEPT Take 1 tablet (10 mg total) by mouth at bedtime.   FeroSul 325 (65 FE) MG tablet Generic drug: ferrous sulfate TAKE 1 TABLET (325 MG TOTAL) BY MOUTH DAILY WITH  BREAKFAST.   fluvoxaMINE 100 MG tablet Commonly known as: LUVOX Take 1 tablet (100 mg total) by mouth daily.   furosemide 40 MG tablet Commonly known as: LASIX Take 1 tablet (40 mg total) by mouth daily.   glipiZIDE 10 MG tablet Commonly known as: GLUCOTROL Take 1 tablet (10 mg total) by mouth 2 (two) times daily.   losartan 50 MG tablet Commonly known as: COZAAR Take 1 tablet (50 mg total) by mouth daily.   memantine 14 MG Cp24 24 hr capsule Commonly known  as: NAMENDA XR Take 1 capsule (14 mg total) by mouth daily. What changed: See the new instructions. Changed by: Fransisca Kaufmann Chizara Mena, MD   multivitamin with minerals tablet Take 1 tablet by mouth daily.   omeprazole 40 MG capsule Commonly known as: PRILOSEC Take 1 capsule (40 mg total) by mouth daily.   potassium chloride SA 20 MEQ tablet Commonly known as: KLOR-CON M Take 1 tablet (20 mEq total) by mouth daily.   pravastatin 40 MG tablet Commonly known as: PRAVACHOL Take 1 tablet (40 mg total) by mouth daily.   PROBIOTIC DAILY PO Take 1 capsule by mouth daily.   sitaGLIPtin 100 MG tablet Commonly known as: Januvia Take 1 tablet (100 mg total) by mouth daily. What changed: how much to take Changed by: Worthy Rancher, MD   traZODone 100 MG tablet Commonly known as: DESYREL Take 1 tablet (100 mg total) by mouth at bedtime as needed for sleep.   True Metrix Blood Glucose Test test strip Generic drug: glucose blood TEST BLOOD SUGAR UP TO FOUR TIMES DAILY Dx E11.9         Objective:   BP 136/77    Pulse 72    Ht $R'5\' 4"'rH$  (1.626 m)    Wt 177 lb (80.3 kg)    SpO2 98%    BMI 30.38 kg/m   Wt Readings from Last 3 Encounters:  02/12/21 177 lb (80.3 kg)  12/19/20 174 lb (78.9 kg)  11/25/20 175 lb (79.4 kg)    Physical Exam Vitals and nursing note reviewed.  Constitutional:      General: She is not in acute distress.    Appearance: She is well-developed. She is not diaphoretic.  Eyes:     Conjunctiva/sclera: Conjunctivae normal.  Cardiovascular:     Rate and Rhythm: Normal rate and regular rhythm.     Heart sounds: Normal heart sounds. No murmur heard. Pulmonary:     Effort: Pulmonary effort is normal. No respiratory distress.     Breath sounds: Normal breath sounds. No wheezing.  Musculoskeletal:        General: No tenderness. Normal range of motion.  Skin:    General: Skin is warm and dry.     Findings: No rash.  Neurological:     Mental Status: She is alert  and oriented to person, place, and time.     Coordination: Coordination normal.  Psychiatric:        Behavior: Behavior normal.      Assessment & Plan:   Problem List Items Addressed This Visit       Cardiovascular and Mediastinum   Hypertension associated with diabetes ([place])   Relevant Medications   glipiZIDE (GLUCOTROL) 10 MG tablet   furosemide (LASIX) 40 MG tablet   sitaGLIPtin (JANUVIA) 100 MG tablet   losartan (COZAAR) 50 MG tablet   potassium chloride SA (KLOR-CON M) 20 MEQ tablet   pravastatin (PRAVACHOL) 40 MG tablet   Other Relevant Orders  BMP8+EGFR     Digestive   GERD (gastroesophageal reflux disease)   Relevant Medications   omeprazole (PRILOSEC) 40 MG capsule     Endocrine   Type 2 diabetes mellitus with other specified complication (HCC) - Primary   Relevant Medications   glipiZIDE (GLUCOTROL) 10 MG tablet   sitaGLIPtin (JANUVIA) 100 MG tablet   losartan (COZAAR) 50 MG tablet   pravastatin (PRAVACHOL) 40 MG tablet   Other Relevant Orders   BMP8+EGFR   Bayer DCA Hb A1c Waived   Hyperlipidemia associated with type 2 diabetes mellitus (HCC)   Relevant Medications   glipiZIDE (GLUCOTROL) 10 MG tablet   furosemide (LASIX) 40 MG tablet   sitaGLIPtin (JANUVIA) 100 MG tablet   losartan (COZAAR) 50 MG tablet   pravastatin (PRAVACHOL) 40 MG tablet     Nervous and Auditory   Dementia (HCC)   Relevant Medications   donepezil (ARICEPT) 10 MG tablet   fluvoxaMINE (LUVOX) 100 MG tablet   memantine (NAMENDA XR) 14 MG CP24 24 hr capsule   traZODone (DESYREL) 100 MG tablet     Other   Depression, recurrent (HCC)   Relevant Medications   fluvoxaMINE (LUVOX) 100 MG tablet   traZODone (DESYREL) 100 MG tablet    Patient will check blood work the next time they draw blood for her oncologist.  No changes today, did refills.  Blood pressure looks good. Follow up plan: Return in about 3 months (around 05/12/2021), or if symptoms worsen or fail to improve, for  Diabetes and hypertension and cholesterol.  Counseling provided for all of the vaccine components Orders Placed This Encounter  Procedures   BMP8+EGFR   Bayer DCA Hb A1c [place], MD [facility] 02/12/2021, 11:27 AM

## 2021-02-13 DIAGNOSIS — I252 Old myocardial infarction: Secondary | ICD-10-CM | POA: Diagnosis not present

## 2021-02-13 DIAGNOSIS — C07 Malignant neoplasm of parotid gland: Secondary | ICD-10-CM | POA: Diagnosis not present

## 2021-02-13 DIAGNOSIS — D37030 Neoplasm of uncertain behavior of the parotid salivary glands: Secondary | ICD-10-CM | POA: Diagnosis not present

## 2021-02-13 DIAGNOSIS — E119 Type 2 diabetes mellitus without complications: Secondary | ICD-10-CM | POA: Diagnosis not present

## 2021-02-13 DIAGNOSIS — I444 Left anterior fascicular block: Secondary | ICD-10-CM | POA: Diagnosis not present

## 2021-02-24 ENCOUNTER — Ambulatory Visit (INDEPENDENT_AMBULATORY_CARE_PROVIDER_SITE_OTHER): Payer: Medicare HMO | Admitting: Family Medicine

## 2021-02-24 ENCOUNTER — Encounter: Payer: Self-pay | Admitting: Family Medicine

## 2021-02-24 ENCOUNTER — Telehealth: Payer: Self-pay | Admitting: Family Medicine

## 2021-02-24 VITALS — BP 132/84 | HR 85 | Ht 64.0 in | Wt 177.0 lb

## 2021-02-24 DIAGNOSIS — Z87891 Personal history of nicotine dependence: Secondary | ICD-10-CM | POA: Diagnosis not present

## 2021-02-24 DIAGNOSIS — G301 Alzheimer's disease with late onset: Secondary | ICD-10-CM

## 2021-02-24 DIAGNOSIS — C07 Malignant neoplasm of parotid gland: Secondary | ICD-10-CM | POA: Diagnosis not present

## 2021-02-24 DIAGNOSIS — T8149XA Infection following a procedure, other surgical site, initial encounter: Secondary | ICD-10-CM | POA: Diagnosis not present

## 2021-02-24 DIAGNOSIS — F02C3 Dementia in other diseases classified elsewhere, severe, with mood disturbance: Secondary | ICD-10-CM

## 2021-02-24 DIAGNOSIS — F0393 Unspecified dementia, unspecified severity, with mood disturbance: Secondary | ICD-10-CM | POA: Diagnosis not present

## 2021-02-24 DIAGNOSIS — Z483 Aftercare following surgery for neoplasm: Secondary | ICD-10-CM | POA: Diagnosis not present

## 2021-02-24 DIAGNOSIS — B999 Unspecified infectious disease: Secondary | ICD-10-CM | POA: Diagnosis not present

## 2021-02-24 NOTE — Telephone Encounter (Signed)
Spoke with son, appointment scheduled today at 4:10 pm with Dr. Warrick Parisian

## 2021-02-24 NOTE — Progress Notes (Signed)
BP 132/84    Pulse 85    Ht $R'5\' 4"'Zj$  (1.626 m)    Wt 177 lb (80.3 kg)    SpO2 94%    BMI 30.38 kg/m    Subjective:   Patient ID: Linda Fletcher, female    DOB: 06/01/1940, 81 y.o.   MRN: [id number]  HPI: Linda Fletcher is a 81 y.o. 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  After discussing with family and they said she is not a candidate for chemotherapy or radiation and likely has 4 to 5 months to live and recommended hospice care.  They are coming in today to discuss that and discussed the positives and negatives of hospice.  Son says that he has 2 very large dogs and thinks that hospice at a facility would be better for her and we will place a consult for hospice care.  She is fatigued and less responsive most of the time and she also has dementia and her memory is in the moderate to severe category.  Relevant past medical, surgical, family and social history reviewed and updated as indicated. Interim medical history since our last visit reviewed. Allergies and medications reviewed and updated.  Review of Systems  Constitutional:  Positive for fatigue. Negative for chills and fever.  Eyes:  Negative for visual disturbance.  Respiratory:  Negative for chest tightness and shortness of breath.   Cardiovascular:  Negative for chest pain and leg swelling.  Musculoskeletal:  Positive for gait problem (Slow shuffling gait).  Skin:  Negative for rash.  Neurological:  Positive for weakness. Negative for dizziness, light-headedness and headaches.  Psychiatric/Behavioral:  Positive for confusion, decreased concentration and sleep disturbance. Negative for agitation and behavioral problems.   All other systems reviewed and are negative.  Per HPI unless specifically indicated above   Allergies as of 02/24/2021   No Known Allergies      Medication List         Accurate as of February 24, 2021  4:29 PM. If you have any questions, ask your nurse or doctor.          blood glucose meter kit and supplies Kit Dispense based on patient and insurance preference. Use up to four times daily as directed. (FOR ICD-9 250.00, 250.01).   calcium carbonate 600 MG Tabs tablet Commonly known as: OS-CAL Take 1 tablet (600 mg total) by mouth 2 (two) times daily with a meal.   donepezil 10 MG tablet Commonly known as: ARICEPT Take 1 tablet (10 mg total) by mouth at bedtime.   FeroSul 325 (65 FE) MG tablet Generic drug: ferrous sulfate TAKE 1 TABLET (325 MG TOTAL) BY MOUTH DAILY WITH BREAKFAST.   fluvoxaMINE 100 MG tablet Commonly known as: LUVOX Take 1 tablet (100 mg total) by mouth daily.   furosemide 40 MG tablet Commonly known as: LASIX Take 1 tablet (40 mg total) by mouth daily.   glipiZIDE 10 MG tablet Commonly known as: GLUCOTROL Take 1 tablet (10 mg total) by mouth 2 (two) times daily.   losartan 50 MG tablet Commonly known as: COZAAR Take 1 tablet (50 mg total) by mouth daily.   memantine 14 MG Cp24 24 hr capsule Commonly known as: NAMENDA XR Take 1 capsule (14 mg total) by mouth daily.   multivitamin with minerals tablet Take 1 tablet by mouth daily.   omeprazole 40  MG capsule Commonly known as: PRILOSEC Take 1 capsule (40 mg total) by mouth daily.   potassium chloride SA 20 MEQ tablet Commonly known as: KLOR-CON M Take 1 tablet (20 mEq total) by mouth daily.   pravastatin 40 MG tablet Commonly known as: PRAVACHOL Take 1 tablet (40 mg total) by mouth daily.   PROBIOTIC DAILY PO Take 1 capsule by mouth daily.   sitaGLIPtin 100 MG tablet Commonly known as: Januvia Take 1 tablet (100 mg total) by mouth daily.   traZODone 100 MG tablet Commonly known as: DESYREL Take 1 tablet (100 mg total) by mouth at bedtime as needed for sleep.   True Metrix Blood Glucose Test test strip Generic drug: glucose blood TEST BLOOD SUGAR  UP TO FOUR TIMES DAILY Dx E11.9         Objective:   BP 132/84    Pulse 85    Ht $R'5\' 4"'wu$  (1.626 m)    Wt 177 lb (80.3 kg)    SpO2 94%    BMI 30.38 kg/m   Wt Readings from Last 3 Encounters:  02/24/21 177 lb (80.3 kg)  02/12/21 177 lb (80.3 kg)  12/19/20 174 lb (78.9 kg)    Physical Exam Vitals and nursing note reviewed.  Constitutional:      General: She is not in acute distress.    Appearance: She is well-developed. She is not diaphoretic.  Eyes:     Conjunctiva/sclera: Conjunctivae normal.  Cardiovascular:     Rate and Rhythm: Normal rate and regular rhythm.     Heart sounds: Normal heart sounds. No murmur heard. Pulmonary:     Effort: Pulmonary effort is normal. No respiratory distress.     Breath sounds: Normal breath sounds. No wheezing.  Musculoskeletal:        General: No tenderness. Normal range of motion.  Skin:    General: Skin is warm and dry.     Findings: No erythema, lesion or rash.     Comments: Incision on left side of neck appears to be healing, patient complains of tenderness but also complains of it on the right side as well  Neurological:     Mental Status: She is alert and oriented to person, place, and time.     Coordination: Coordination normal.  Psychiatric:        Behavior: Behavior normal.    Results for orders placed or performed during the hospital encounter of 12/19/20  Glucose, capillary  Result Value Ref Range   Glucose-Capillary 136 (H) 70 - 99 mg/dL  Cytology - Non PAP; 4 x FNA of suspected left parotid CA recurrence  Result Value Ref Range   CYTOLOGY - NON GYN      CYTOLOGY - NON PAP CASE: [id number] PATIENT: Lonn Georgia Non-Gynecological Cytology Report     Clinical History: None provided    FINAL MICROSCOPIC DIAGNOSIS: A. NECK, LEFT PAROTID SPACE, FINE NEEDLE ASPIRATION: - Malignant cells consistent with carcinoma.  SPECIMEN ADEQUACY: A. Satisfactory for Evaluation  GROSS: Received is/are (A)(1)2 slides. (2) 2  slides. (3) 2 slides. (4) 2 slides. Also received 5cc's of cloudy, peach saline solution from needle rinses.(MA:TB:tb) Smears: (A)8 Concentration Method (Thin Prep)(A)1 Cell Block: (A) 1 Conventional Additional Studies: NA    Final Diagnosis performed by Claudette Laws, MD.   Electronically signed 12/20/2020 Technical component performed at [facility]. [facility], [place] [address], [place], [place] [address].  Professional component performed at [facility] [place] [address], [place], [place] [address].  Immunohistochemistry Technical component (if applicable) was performed at  [facility]. [address], [place], [place], [place] [address].   IMMUNOHISTOCHEMISTRY DISCLAIMER (if applicable): Some of these immunohistochemical stains may have been developed and the performance characteristics determine by [facility]. Some may not have been cleared or approved by the U.S. Food and Drug Administration. The FDA has determined that such clearance or approval is not necessary. This test is used for clinical purposes. It should not be regarded as investigational or for research. This laboratory is certified under the [employer] (CLIA-88) as qualified to perform high complexity clinical laboratory testing.  The controls stained appropriately.     Assessment & Plan:   Problem List Items Addressed This Visit       Nervous and Auditory   Dementia [facility])   Relevant Orders   Ambulatory referral to Hospice   Other Visit Diagnoses     Malignant neoplasm of parotid gland [facility])    -  Primary   Relevant Orders   Ambulatory referral to Hospice       Placed referral for hospice for the patient. Follow up plan: Return if symptoms worsen or fail to improve.  Counseling provided for all of the vaccine components Orders Placed This Encounter  Procedures   Ambulatory referral to [place], MD [facility] 02/24/2021, 4:29 PM

## 2021-03-05 ENCOUNTER — Encounter: Payer: Self-pay | Admitting: Family Medicine

## 2021-03-05 ENCOUNTER — Ambulatory Visit: Payer: Medicare HMO | Admitting: Family Medicine

## 2021-04-14 DIAGNOSIS — I10 Essential (primary) hypertension: Secondary | ICD-10-CM | POA: Diagnosis not present

## 2021-04-14 DIAGNOSIS — E119 Type 2 diabetes mellitus without complications: Secondary | ICD-10-CM | POA: Diagnosis not present

## 2021-04-15 DIAGNOSIS — C07 Malignant neoplasm of parotid gland: Secondary | ICD-10-CM | POA: Diagnosis not present

## 2021-04-24 DIAGNOSIS — Z515 Encounter for palliative care: Secondary | ICD-10-CM | POA: Diagnosis not present

## 2021-04-24 DIAGNOSIS — I5030 Unspecified diastolic (congestive) heart failure: Secondary | ICD-10-CM | POA: Diagnosis not present

## 2021-04-24 DIAGNOSIS — I10 Essential (primary) hypertension: Secondary | ICD-10-CM | POA: Diagnosis not present

## 2021-04-24 DIAGNOSIS — C07 Malignant neoplasm of parotid gland: Secondary | ICD-10-CM | POA: Diagnosis not present

## 2021-04-28 DIAGNOSIS — R296 Repeated falls: Secondary | ICD-10-CM | POA: Diagnosis not present

## 2021-04-28 DIAGNOSIS — R2689 Other abnormalities of gait and mobility: Secondary | ICD-10-CM | POA: Diagnosis not present

## 2021-04-28 DIAGNOSIS — R21 Rash and other nonspecific skin eruption: Secondary | ICD-10-CM | POA: Diagnosis not present

## 2021-04-28 DIAGNOSIS — Z515 Encounter for palliative care: Secondary | ICD-10-CM | POA: Diagnosis not present

## 2021-05-15 DIAGNOSIS — L281 Prurigo nodularis: Secondary | ICD-10-CM | POA: Diagnosis not present

## 2021-05-22 DIAGNOSIS — C07 Malignant neoplasm of parotid gland: Secondary | ICD-10-CM | POA: Diagnosis not present

## 2021-05-22 DIAGNOSIS — I10 Essential (primary) hypertension: Secondary | ICD-10-CM | POA: Diagnosis not present

## 2021-05-22 DIAGNOSIS — I5030 Unspecified diastolic (congestive) heart failure: Secondary | ICD-10-CM | POA: Diagnosis not present

## 2021-05-22 DIAGNOSIS — Z515 Encounter for palliative care: Secondary | ICD-10-CM | POA: Diagnosis not present

## 2021-07-01 DIAGNOSIS — I509 Heart failure, unspecified: Secondary | ICD-10-CM | POA: Diagnosis not present

## 2021-07-01 DIAGNOSIS — K219 Gastro-esophageal reflux disease without esophagitis: Secondary | ICD-10-CM | POA: Diagnosis not present

## 2021-07-01 DIAGNOSIS — I10 Essential (primary) hypertension: Secondary | ICD-10-CM | POA: Diagnosis not present

## 2021-07-08 ENCOUNTER — Ambulatory Visit: Payer: Medicare HMO | Admitting: Orthopaedic Surgery

## 2021-08-12 ENCOUNTER — Ambulatory Visit: Payer: Self-pay | Admitting: *Deleted

## 2021-08-12 DIAGNOSIS — C07 Malignant neoplasm of parotid gland: Secondary | ICD-10-CM

## 2021-08-12 NOTE — Chronic Care Management (AMB) (Signed)
  Chronic Care Management   Note  08/12/2021 Name: Linda Fletcher MRN: [id number] DOB: Apr 03, 1940   Due to changes in the Chronic Care Management program, I am removing myself as the [place]ry from the Care Team and closing [place]. Patient was not scheduled to be followed by the RN Care Coordination nurse for [facility]. Hospice is involved with her care.  Patient does not have an open Care Plan with another CCM team member.. Patient does not have a current CCM referral placed since 05/12/21. CCM enrollment status changed to "not enrolled".   Patient's PCP can place a new referral if the they needs Care Management or Care Coordination services in the future.  [name], BSN, RN-BC Proofreader Dial: [phone]

## 2021-08-12 NOTE — Patient Instructions (Signed)
Lonn Georgia  At some point during the past 4 years, I have worked with you through the [place] Management Program at [facility].  Due to program changes I am removing myself from your care team.   If you are currently active with another CCM Team Member, you will remain active with them unless they reach out to you with additional information.   If you feel that you need services in the future,  please talk with your primary care provider and request a new referral for Care Management or Care Coordination services. This does not affect your status as a patient at [facility].   Thank you for allowing me to participate in your your healthcare journey.  [name], BSN, RN-BC Proofreader Dial: [phone]

## 2021-09-16 DIAGNOSIS — K219 Gastro-esophageal reflux disease without esophagitis: Secondary | ICD-10-CM | POA: Diagnosis not present

## 2021-09-16 DIAGNOSIS — I10 Essential (primary) hypertension: Secondary | ICD-10-CM | POA: Diagnosis not present

## 2021-09-16 DIAGNOSIS — E785 Hyperlipidemia, unspecified: Secondary | ICD-10-CM | POA: Diagnosis not present

## 2021-10-01 DIAGNOSIS — R269 Unspecified abnormalities of gait and mobility: Secondary | ICD-10-CM | POA: Diagnosis not present

## 2021-10-01 DIAGNOSIS — M170 Bilateral primary osteoarthritis of knee: Secondary | ICD-10-CM | POA: Diagnosis not present

## 2021-10-01 DIAGNOSIS — R519 Headache, unspecified: Secondary | ICD-10-CM | POA: Diagnosis not present

## 2021-10-06 DIAGNOSIS — R5383 Other fatigue: Secondary | ICD-10-CM | POA: Diagnosis not present

## 2021-10-06 DIAGNOSIS — R519 Headache, unspecified: Secondary | ICD-10-CM | POA: Diagnosis not present

## 2021-10-06 DIAGNOSIS — R0689 Other abnormalities of breathing: Secondary | ICD-10-CM | POA: Diagnosis not present

## 2021-10-07 DIAGNOSIS — F09 Unspecified mental disorder due to known physiological condition: Secondary | ICD-10-CM | POA: Diagnosis not present

## 2021-10-08 DIAGNOSIS — R5383 Other fatigue: Secondary | ICD-10-CM | POA: Diagnosis not present

## 2021-10-14 DIAGNOSIS — F09 Unspecified mental disorder due to known physiological condition: Secondary | ICD-10-CM | POA: Diagnosis not present

## 2021-10-31 DIAGNOSIS — H40013 Open angle with borderline findings, low risk, bilateral: Secondary | ICD-10-CM | POA: Diagnosis not present

## 2021-10-31 DIAGNOSIS — E119 Type 2 diabetes mellitus without complications: Secondary | ICD-10-CM | POA: Diagnosis not present

## 2021-10-31 DIAGNOSIS — H524 Presbyopia: Secondary | ICD-10-CM | POA: Diagnosis not present

## 2021-10-31 DIAGNOSIS — Z961 Presence of intraocular lens: Secondary | ICD-10-CM | POA: Diagnosis not present

## 2021-11-24 ENCOUNTER — Encounter (INDEPENDENT_AMBULATORY_CARE_PROVIDER_SITE_OTHER): Payer: Self-pay | Admitting: Gastroenterology

## 2022-03-06 ENCOUNTER — Ambulatory Visit: Payer: Medicare Other | Admitting: Orthopedic Surgery

## 2022-03-06 ENCOUNTER — Ambulatory Visit (INDEPENDENT_AMBULATORY_CARE_PROVIDER_SITE_OTHER): Payer: Medicare Other

## 2022-03-06 ENCOUNTER — Encounter: Payer: Self-pay | Admitting: Orthopedic Surgery

## 2022-03-06 VITALS — BP 168/86 | HR 91 | Ht 64.0 in | Wt 179.0 lb

## 2022-03-06 DIAGNOSIS — M25561 Pain in right knee: Secondary | ICD-10-CM | POA: Diagnosis not present

## 2022-03-06 DIAGNOSIS — G8929 Other chronic pain: Secondary | ICD-10-CM

## 2022-03-06 DIAGNOSIS — M170 Bilateral primary osteoarthritis of knee: Secondary | ICD-10-CM

## 2022-03-06 DIAGNOSIS — M1712 Unilateral primary osteoarthritis, left knee: Secondary | ICD-10-CM

## 2022-03-06 DIAGNOSIS — M25562 Pain in left knee: Secondary | ICD-10-CM

## 2022-03-06 DIAGNOSIS — M1711 Unilateral primary osteoarthritis, right knee: Secondary | ICD-10-CM | POA: Diagnosis not present

## 2022-03-06 NOTE — Progress Notes (Signed)
New Patient Visit  Assessment: Linda Fletcher is a 82 y.o. female with the following: 1. Arthritis of knee, left 2. Arthritis of knee, right  Plan: Linda Fletcher has pain in both knees.  Left is worse than right.  Radiographs demonstrates a varus alignment bilaterally, with complete loss of joint space in the medial compartment.  We discussed multiple treatment options.  She is willing to proceed with injections.  Bilateral knee injections were completed today.  She will contact clinic if she has further issues.  Procedure note injection Left knee joint   Verbal consent was obtained to inject the left knee joint  Timeout was completed to confirm the site of injection.  The skin was prepped with alcohol and ethyl chloride was sprayed at the injection site.  A 21-gauge needle was used to inject 40 mg of Depo-Medrol and 1% lidocaine (3 cc) into the left knee using an anterolateral approach.  There were no complications. A sterile bandage was applied.     Procedure note injection Right knee joint   Verbal consent was obtained to inject the right knee joint  Timeout was completed to confirm the site of injection.  The skin was prepped with alcohol and ethyl chloride was sprayed at the injection site.  A 21-gauge needle was used to inject 40 mg of Depo-Medrol and 1% lidocaine (3 cc) into the left knee using an anterolateral approach.  There were no complications. A sterile bandage was applied.   Follow-up: Return if symptoms worsen or fail to improve.  Subjective:  Chief Complaint  Patient presents with   Knee Pain    Bilat knee pain L >R getting worse over last year.     History of Present Illness: Linda Fletcher is a 82 y.o. female who presents for evaluation of bilateral knee pain.  She states left knee is worse than the right knee.  Pain has been progressively worsening for the past year.  No history of an injury.  She will take Tylenol for pain, with limited improvement in  symptoms.  She has not had an injection in many years.  No brace wear.  She is not using assistive device.   Review of Systems: No fevers or chills No numbness or tingling No chest pain No shortness of breath No bowel or bladder dysfunction No GI distress No headaches   Medical History:  Past Medical History:  Diagnosis Date   Arthritis    Dementia ([place])    Depression    Diabetes mellitus without complication (HCC)    GERD (gastroesophageal reflux disease)    Glaucoma    Hx of adenomatous colonic polyps    Hyperlipidemia    Hypertension    Obesity     Past Surgical History:  Procedure Laterality Date   ABDOMINAL HYSTERECTOMY     CHOLECYSTECTOMY     THYROID CYST EXCISION     TONSILLECTOMY AND ADENOIDECTOMY     age 35    Family History  Problem Relation Age of Onset   Arthritis Mother    Diabetes Mother    Diabetes Son    Hypertension Son    Hyperlipidemia Son    Hypertension Daughter    Social History   Tobacco Use   Smoking status: Former    Types: Cigarettes    Quit date: 07/18/1974    Years since quitting: 47.6   Smokeless tobacco: Never  Vaping Use   Vaping Use: Never used  Substance Use Topics   Alcohol use: No  Drug use: No    No Known Allergies  No outpatient medications have been marked as taking for the 03/06/22 encounter (Office Visit) with Mordecai Rasmussen, MD.    Objective: BP (!) 168/86   Pulse 91   Ht '5\' 4"'$  (1.626 m)   Wt 179 lb (81.2 kg)   BMI 30.73 kg/m   Physical Exam:  General: Alert and oriented. and No acute distress. Gait: Slow, steady gait.  Bilateral knees without deformity.  Varus alignment overall.  Tenderness to palpation over the medial joint line.  She is able to achieve full extension bilaterally.  She tolerates flexion beyond 90 degrees.  Negative Lachman.  IMAGING: I personally ordered and reviewed the following images   X-rays of bilateral knees were obtained in clinic today.  Varus alignment.  Complete  loss of joint space within the medial compartment.  There are some osteophytes associated with the loss of joint space.  No bony lesions.  No acute injuries.  Impression: Advanced degenerative changes of bilateral knees, specifically within the medial compartment.   New Medications:  No orders of the defined types were placed in this encounter.     Mordecai Rasmussen, MD  03/06/2022 11:56 AM

## 2022-03-06 NOTE — Patient Instructions (Signed)

## 2022-03-12 ENCOUNTER — Encounter: Payer: Self-pay | Admitting: Radiology

## 2023-10-27 ENCOUNTER — Encounter (INDEPENDENT_AMBULATORY_CARE_PROVIDER_SITE_OTHER): Payer: Self-pay | Admitting: Gastroenterology
